# Patient Record
Sex: Male | Born: 1950 | Race: White | Hispanic: No | Smoking: Never smoker
Health system: Southern US, Community
[De-identification: ages and names within clinical notes are randomized; demographics above are authoritative.]

## PROBLEM LIST (undated history)

## (undated) DIAGNOSIS — K750 Abscess of liver: Secondary | ICD-10-CM

## (undated) DIAGNOSIS — N189 Chronic kidney disease, unspecified: Secondary | ICD-10-CM

## (undated) DIAGNOSIS — I499 Cardiac arrhythmia, unspecified: Secondary | ICD-10-CM

## (undated) DIAGNOSIS — I10 Essential (primary) hypertension: Secondary | ICD-10-CM

## (undated) DIAGNOSIS — R931 Abnormal findings on diagnostic imaging of heart and coronary circulation: Secondary | ICD-10-CM

## (undated) DIAGNOSIS — I219 Acute myocardial infarction, unspecified: Secondary | ICD-10-CM

## (undated) DIAGNOSIS — I509 Heart failure, unspecified: Secondary | ICD-10-CM

## (undated) DIAGNOSIS — I428 Other cardiomyopathies: Secondary | ICD-10-CM

## (undated) DIAGNOSIS — K529 Noninfective gastroenteritis and colitis, unspecified: Secondary | ICD-10-CM

## (undated) HISTORY — PX: KNEE ARTHROSCOPY: SUR90

## (undated) HISTORY — PX: SHOULDER ARTHROSCOPY: SHX128

## (undated) HISTORY — PX: INSERT / REPLACE / REMOVE PACEMAKER: SUR710

## (undated) HISTORY — PX: OTHER SURGICAL HISTORY: SHX169

## (undated) HISTORY — PX: FRACTURE SURGERY: SHX138

## (undated) HISTORY — PX: COLONOSCOPY: SHX174

## (undated) HISTORY — PX: HERNIA REPAIR: SHX51

---

## 2006-01-02 ENCOUNTER — Emergency Department: Payer: Self-pay | Admitting: Emergency Medicine

## 2010-11-14 ENCOUNTER — Ambulatory Visit: Payer: Self-pay | Admitting: General Surgery

## 2010-11-16 LAB — PATHOLOGY REPORT

## 2011-01-23 ENCOUNTER — Ambulatory Visit: Payer: Self-pay | Admitting: General Surgery

## 2011-01-25 ENCOUNTER — Ambulatory Visit: Payer: Self-pay | Admitting: General Surgery

## 2013-05-12 ENCOUNTER — Inpatient Hospital Stay: Payer: Self-pay | Admitting: Student

## 2013-05-12 LAB — TROPONIN I
TROPONIN-I: 0.07 ng/mL — AB
Troponin-I: 0.06 ng/mL — ABNORMAL HIGH
Troponin-I: 0.05 ng/mL

## 2013-05-12 LAB — URINALYSIS, COMPLETE
Bacteria: NONE SEEN
Glucose,UR: NEGATIVE mg/dL (ref 0–75)
Hyaline Cast: 10
Ketone: NEGATIVE
Leukocyte Esterase: NEGATIVE
NITRITE: NEGATIVE
Ph: 5 (ref 4.5–8.0)
Protein: NEGATIVE
RBC,UR: 3 /HPF (ref 0–5)
Specific Gravity: 1.014 (ref 1.003–1.030)
Squamous Epithelial: NONE SEEN
WBC UR: 2 /HPF (ref 0–5)

## 2013-05-12 LAB — CBC WITH DIFFERENTIAL/PLATELET
BASOS ABS: 0 10*3/uL (ref 0.0–0.1)
BASOS PCT: 0.2 %
Eosinophil #: 0 10*3/uL (ref 0.0–0.7)
Eosinophil %: 0.1 %
HCT: 28.1 % — ABNORMAL LOW (ref 40.0–52.0)
HGB: 9.3 g/dL — ABNORMAL LOW (ref 13.0–18.0)
LYMPHS PCT: 3.6 %
Lymphocyte #: 0.4 10*3/uL — ABNORMAL LOW (ref 1.0–3.6)
MCH: 29.8 pg (ref 26.0–34.0)
MCHC: 33.1 g/dL (ref 32.0–36.0)
MCV: 90 fL (ref 80–100)
MONOS PCT: 5.3 %
Monocyte #: 0.6 x10 3/mm (ref 0.2–1.0)
NEUTROS ABS: 10 10*3/uL — AB (ref 1.4–6.5)
Neutrophil %: 90.8 %
Platelet: 108 10*3/uL — ABNORMAL LOW (ref 150–440)
RBC: 3.12 10*6/uL — ABNORMAL LOW (ref 4.40–5.90)
RDW: 15 % — ABNORMAL HIGH (ref 11.5–14.5)
WBC: 11 10*3/uL — AB (ref 3.8–10.6)

## 2013-05-12 LAB — COMPREHENSIVE METABOLIC PANEL
ALBUMIN: 1.9 g/dL — AB (ref 3.4–5.0)
AST: 50 U/L — AB (ref 15–37)
Alkaline Phosphatase: 173 U/L — ABNORMAL HIGH
Anion Gap: 9 (ref 7–16)
BUN: 67 mg/dL — AB (ref 7–18)
Bilirubin,Total: 1.1 mg/dL — ABNORMAL HIGH (ref 0.2–1.0)
CALCIUM: 8.6 mg/dL (ref 8.5–10.1)
Chloride: 99 mmol/L (ref 98–107)
Co2: 20 mmol/L — ABNORMAL LOW (ref 21–32)
Creatinine: 3.45 mg/dL — ABNORMAL HIGH (ref 0.60–1.30)
EGFR (African American): 21 — ABNORMAL LOW
EGFR (Non-African Amer.): 18 — ABNORMAL LOW
GLUCOSE: 124 mg/dL — AB (ref 65–99)
OSMOLALITY: 278 (ref 275–301)
POTASSIUM: 4.2 mmol/L (ref 3.5–5.1)
SGPT (ALT): 60 U/L (ref 12–78)
Sodium: 128 mmol/L — ABNORMAL LOW (ref 136–145)
TOTAL PROTEIN: 6.4 g/dL (ref 6.4–8.2)

## 2013-05-12 LAB — CLOSTRIDIUM DIFFICILE(ARMC)

## 2013-05-12 LAB — CBC
HCT: 30.4 % — AB (ref 40.0–52.0)
HGB: 10.5 g/dL — ABNORMAL LOW (ref 13.0–18.0)
MCH: 31.1 pg (ref 26.0–34.0)
MCHC: 34.6 g/dL (ref 32.0–36.0)
MCV: 90 fL (ref 80–100)
PLATELETS: 113 10*3/uL — AB (ref 150–440)
RBC: 3.37 10*6/uL — AB (ref 4.40–5.90)
RDW: 14.9 % — ABNORMAL HIGH (ref 11.5–14.5)
WBC: 10.4 10*3/uL (ref 3.8–10.6)

## 2013-05-12 LAB — CK-MB
CK-MB: 1.3 ng/mL (ref 0.5–3.6)
CK-MB: 1.8 ng/mL (ref 0.5–3.6)
CK-MB: 2.3 ng/mL (ref 0.5–3.6)

## 2013-05-12 LAB — LIPASE, BLOOD: Lipase: 415 U/L — ABNORMAL HIGH (ref 73–393)

## 2013-05-12 LAB — DIGOXIN LEVEL: DIGOXIN: 0.69 ng/mL

## 2013-05-12 LAB — CK: CK, TOTAL: 26 U/L — AB

## 2013-05-12 LAB — AMMONIA: Ammonia, Plasma: <10 mcmol/L (ref 11–32)

## 2013-05-12 LAB — APTT: Activated PTT: 29.3 secs (ref 23.6–35.9)

## 2013-05-13 DIAGNOSIS — I5022 Chronic systolic (congestive) heart failure: Secondary | ICD-10-CM

## 2013-05-13 DIAGNOSIS — I4891 Unspecified atrial fibrillation: Secondary | ICD-10-CM

## 2013-05-13 DIAGNOSIS — I319 Disease of pericardium, unspecified: Secondary | ICD-10-CM

## 2013-05-13 LAB — CBC WITH DIFFERENTIAL/PLATELET
BASOS ABS: 0 10*3/uL (ref 0.0–0.1)
BASOS PCT: 0.1 %
EOS ABS: 0 10*3/uL (ref 0.0–0.7)
Eosinophil %: 0.1 %
HCT: 27 % — ABNORMAL LOW (ref 40.0–52.0)
HGB: 9.1 g/dL — AB (ref 13.0–18.0)
Lymphocyte #: 0.5 10*3/uL — ABNORMAL LOW (ref 1.0–3.6)
Lymphocyte %: 3.9 %
MCH: 30.6 pg (ref 26.0–34.0)
MCHC: 33.9 g/dL (ref 32.0–36.0)
MCV: 90 fL (ref 80–100)
MONO ABS: 1.2 x10 3/mm — AB (ref 0.2–1.0)
Monocyte %: 8.9 %
NEUTROS PCT: 87 %
Neutrophil #: 11.5 10*3/uL — ABNORMAL HIGH (ref 1.4–6.5)
PLATELETS: 110 10*3/uL — AB (ref 150–440)
RBC: 2.99 10*6/uL — ABNORMAL LOW (ref 4.40–5.90)
RDW: 15.1 % — AB (ref 11.5–14.5)
WBC: 13.2 10*3/uL — AB (ref 3.8–10.6)

## 2013-05-13 LAB — BASIC METABOLIC PANEL
ANION GAP: 9 (ref 7–16)
BUN: 60 mg/dL — ABNORMAL HIGH (ref 7–18)
CALCIUM: 7.8 mg/dL — AB (ref 8.5–10.1)
CHLORIDE: 105 mmol/L (ref 98–107)
CO2: 18 mmol/L — AB (ref 21–32)
CREATININE: 2.46 mg/dL — AB (ref 0.60–1.30)
GFR CALC AF AMER: 32 — AB
GFR CALC NON AF AMER: 27 — AB
Glucose: 135 mg/dL — ABNORMAL HIGH (ref 65–99)
Osmolality: 283 (ref 275–301)
POTASSIUM: 4.1 mmol/L (ref 3.5–5.1)
Sodium: 132 mmol/L — ABNORMAL LOW (ref 136–145)

## 2013-05-13 LAB — RAPID HIV-1/2 QL/CONFIRM: HIV-1/2, RAPID QL: NEGATIVE

## 2013-05-13 LAB — AFP TUMOR MARKER

## 2013-05-13 LAB — CANCER ANTIGEN 19-9: CA 19-9: 11 U/mL (ref 0–35)

## 2013-05-13 LAB — PROTEIN / CREATININE RATIO, URINE
Creatinine, Urine: 15.7 mg/dL — ABNORMAL LOW (ref 30.0–125.0)
PROTEIN, RANDOM URINE: 13 mg/dL — AB (ref 0–12)
Protein/Creat. Ratio: 828 mg/gCREAT — ABNORMAL HIGH (ref 0–200)

## 2013-05-13 LAB — CEA: CEA: 1.5 ng/mL (ref 0.0–4.7)

## 2013-05-13 LAB — HEMOGLOBIN A1C: Hemoglobin A1C: 7.7 % — ABNORMAL HIGH (ref 4.2–6.3)

## 2013-05-13 LAB — TSH: Thyroid Stimulating Horm: 0.49 u[IU]/mL

## 2013-05-13 LAB — MAGNESIUM: Magnesium: 2 mg/dL

## 2013-05-13 LAB — APTT
Activated PTT: 56.4 secs — ABNORMAL HIGH (ref 23.6–35.9)
Activated PTT: 62.6 secs — ABNORMAL HIGH (ref 23.6–35.9)

## 2013-05-14 LAB — BASIC METABOLIC PANEL
Anion Gap: 5 — ABNORMAL LOW (ref 7–16)
BUN: 25 mg/dL — ABNORMAL HIGH (ref 7–18)
CO2: 24 mmol/L (ref 21–32)
Calcium, Total: 7.7 mg/dL — ABNORMAL LOW (ref 8.5–10.1)
Chloride: 105 mmol/L (ref 98–107)
Creatinine: 1.33 mg/dL — ABNORMAL HIGH (ref 0.60–1.30)
EGFR (African American): >60
GFR CALC NON AF AMER: 58 — AB
Glucose: 112 mg/dL — ABNORMAL HIGH (ref 65–99)
Osmolality: 273 (ref 275–301)
POTASSIUM: 3.5 mmol/L (ref 3.5–5.1)
Sodium: 134 mmol/L — ABNORMAL LOW (ref 136–145)

## 2013-05-14 LAB — CBC WITH DIFFERENTIAL/PLATELET
Basophil #: 0 10*3/uL (ref 0.0–0.1)
Basophil %: 0.2 %
Eosinophil #: 0 10*3/uL (ref 0.0–0.7)
Eosinophil %: 0.7 %
HCT: 27 % — AB (ref 40.0–52.0)
HGB: 9.4 g/dL — ABNORMAL LOW (ref 13.0–18.0)
LYMPHS ABS: 0.9 10*3/uL — AB (ref 1.0–3.6)
LYMPHS PCT: 12 %
MCH: 31.1 pg (ref 26.0–34.0)
MCHC: 35 g/dL (ref 32.0–36.0)
MCV: 89 fL (ref 80–100)
MONO ABS: 0.6 x10 3/mm (ref 0.2–1.0)
MONOS PCT: 8.9 %
NEUTROS PCT: 78.2 %
Neutrophil #: 5.6 10*3/uL (ref 1.4–6.5)
PLATELETS: 106 10*3/uL — AB (ref 150–440)
RBC: 3.03 10*6/uL — AB (ref 4.40–5.90)
RDW: 15.1 % — AB (ref 11.5–14.5)
WBC: 7.2 10*3/uL (ref 3.8–10.6)

## 2013-05-14 LAB — STOOL CULTURE

## 2013-05-14 LAB — URINE CULTURE

## 2013-05-14 LAB — APTT: Activated PTT: 81.3 secs — ABNORMAL HIGH (ref 23.6–35.9)

## 2013-05-15 DIAGNOSIS — I319 Disease of pericardium, unspecified: Secondary | ICD-10-CM

## 2013-05-15 DIAGNOSIS — I4891 Unspecified atrial fibrillation: Secondary | ICD-10-CM

## 2013-05-15 DIAGNOSIS — I5033 Acute on chronic diastolic (congestive) heart failure: Secondary | ICD-10-CM

## 2013-05-15 LAB — PROTEIN ELECTROPHORESIS(ARMC)

## 2013-05-15 LAB — BASIC METABOLIC PANEL
Anion Gap: 6 — ABNORMAL LOW (ref 7–16)
BUN: 11 mg/dL (ref 7–18)
Calcium, Total: 7.3 mg/dL — ABNORMAL LOW (ref 8.5–10.1)
Chloride: 104 mmol/L (ref 98–107)
Co2: 26 mmol/L (ref 21–32)
Creatinine: 0.96 mg/dL (ref 0.60–1.30)
Glucose: 124 mg/dL — ABNORMAL HIGH (ref 65–99)
Osmolality: 273 (ref 275–301)
POTASSIUM: 3.3 mmol/L — AB (ref 3.5–5.1)
SODIUM: 136 mmol/L (ref 136–145)

## 2013-05-15 LAB — APTT: ACTIVATED PTT: 66.4 s — AB (ref 23.6–35.9)

## 2013-05-15 LAB — CK-MB
CK-MB: 1.5 ng/mL (ref 0.5–3.6)
CK-MB: 1.4 ng/mL (ref 0.5–3.6)

## 2013-05-15 LAB — TROPONIN I
Troponin-I: 0.03 ng/mL
Troponin-I: 0.05 ng/mL

## 2013-05-15 LAB — HEMOGLOBIN
HGB: 9.4 g/dL — AB (ref 13.0–18.0)
HGB: 10.2 g/dL — AB (ref 13.0–18.0)
HGB: 9.7 g/dL — ABNORMAL LOW (ref 13.0–18.0)

## 2013-05-15 LAB — UR PROT ELECTROPHORESIS, URINE RANDOM

## 2013-05-16 LAB — CBC WITH DIFFERENTIAL/PLATELET
BASOS ABS: 0 10*3/uL (ref 0.0–0.1)
BASOS PCT: 0.8 %
EOS PCT: 1.4 %
Eosinophil #: 0.1 10*3/uL (ref 0.0–0.7)
HCT: 29 % — ABNORMAL LOW (ref 40.0–52.0)
HGB: 9.6 g/dL — ABNORMAL LOW (ref 13.0–18.0)
LYMPHS ABS: 0.9 10*3/uL — AB (ref 1.0–3.6)
LYMPHS PCT: 16.8 %
MCH: 29.7 pg (ref 26.0–34.0)
MCHC: 33.1 g/dL (ref 32.0–36.0)
MCV: 90 fL (ref 80–100)
Monocyte #: 0.8 x10 3/mm (ref 0.2–1.0)
Monocyte %: 13.6 %
NEUTROS PCT: 67.4 %
Neutrophil #: 3.8 10*3/uL (ref 1.4–6.5)
Platelet: 107 10*3/uL — ABNORMAL LOW (ref 150–440)
RBC: 3.23 10*6/uL — ABNORMAL LOW (ref 4.40–5.90)
RDW: 14.8 % — ABNORMAL HIGH (ref 11.5–14.5)
WBC: 5.6 10*3/uL (ref 3.8–10.6)

## 2013-05-16 LAB — MAGNESIUM
Magnesium: 1.4 mg/dL — ABNORMAL LOW
Magnesium: 1.8 mg/dL

## 2013-05-16 LAB — BASIC METABOLIC PANEL
ANION GAP: 6 — AB (ref 7–16)
BUN: 8 mg/dL (ref 7–18)
Calcium, Total: 7.5 mg/dL — ABNORMAL LOW (ref 8.5–10.1)
Chloride: 105 mmol/L (ref 98–107)
Co2: 26 mmol/L (ref 21–32)
Creatinine: 0.97 mg/dL (ref 0.60–1.30)
EGFR (African American): >60
Glucose: 114 mg/dL — ABNORMAL HIGH (ref 65–99)
Osmolality: 273 (ref 275–301)
Potassium: 3.5 mmol/L (ref 3.5–5.1)
Sodium: 137 mmol/L (ref 136–145)

## 2013-05-16 LAB — VANCOMYCIN, TROUGH: VANCOMYCIN, TROUGH: 5 ug/mL — AB (ref 10–20)

## 2013-05-17 LAB — CBC WITH DIFFERENTIAL/PLATELET
Basophil #: 0 10*3/uL (ref 0.0–0.1)
Basophil %: 0.7 %
EOS PCT: 2.2 %
Eosinophil #: 0.1 10*3/uL (ref 0.0–0.7)
HCT: 25.7 % — ABNORMAL LOW (ref 40.0–52.0)
HGB: 8.7 g/dL — ABNORMAL LOW (ref 13.0–18.0)
LYMPHS ABS: 0.8 10*3/uL — AB (ref 1.0–3.6)
Lymphocyte %: 15.5 %
MCH: 30.2 pg (ref 26.0–34.0)
MCHC: 33.9 g/dL (ref 32.0–36.0)
MCV: 89 fL (ref 80–100)
MONO ABS: 0.6 x10 3/mm (ref 0.2–1.0)
MONOS PCT: 11.2 %
NEUTROS PCT: 70.4 %
Neutrophil #: 3.7 10*3/uL (ref 1.4–6.5)
PLATELETS: 118 10*3/uL — AB (ref 150–440)
RBC: 2.88 10*6/uL — AB (ref 4.40–5.90)
RDW: 14.6 % — AB (ref 11.5–14.5)
WBC: 5.2 10*3/uL (ref 3.8–10.6)

## 2013-05-17 LAB — COMPREHENSIVE METABOLIC PANEL
ALBUMIN: 1.6 g/dL — AB (ref 3.4–5.0)
AST: 41 U/L — AB (ref 15–37)
Alkaline Phosphatase: 150 U/L — ABNORMAL HIGH
Anion Gap: 4 — ABNORMAL LOW (ref 7–16)
BILIRUBIN TOTAL: 0.7 mg/dL (ref 0.2–1.0)
BUN: 6 mg/dL — ABNORMAL LOW (ref 7–18)
Calcium, Total: 7.4 mg/dL — ABNORMAL LOW (ref 8.5–10.1)
Chloride: 105 mmol/L (ref 98–107)
Co2: 27 mmol/L (ref 21–32)
Creatinine: 0.83 mg/dL (ref 0.60–1.30)
EGFR (African American): >60
GLUCOSE: 107 mg/dL — AB (ref 65–99)
OSMOLALITY: 270 (ref 275–301)
POTASSIUM: 3.4 mmol/L — AB (ref 3.5–5.1)
SGPT (ALT): 34 U/L (ref 12–78)
Sodium: 136 mmol/L (ref 136–145)
Total Protein: 5.2 g/dL — ABNORMAL LOW (ref 6.4–8.2)

## 2013-05-17 LAB — VANCOMYCIN, TROUGH: Vancomycin, Trough: 12 ug/mL (ref 10–20)

## 2013-05-18 ENCOUNTER — Ambulatory Visit: Payer: Self-pay | Admitting: Oncology

## 2013-05-18 LAB — CULTURE, BLOOD (SINGLE)

## 2013-05-18 LAB — CREATININE, SERUM
CREATININE: 0.83 mg/dL (ref 0.60–1.30)
EGFR (Non-African Amer.): >60

## 2013-05-18 LAB — APTT: Activated PTT: 30.3 secs (ref 23.6–35.9)

## 2013-05-19 LAB — COMPREHENSIVE METABOLIC PANEL
ALBUMIN: 1.7 g/dL — AB (ref 3.4–5.0)
ALT: 48 U/L (ref 12–78)
Alkaline Phosphatase: 171 U/L — ABNORMAL HIGH
Anion Gap: 4 — ABNORMAL LOW (ref 7–16)
BUN: 12 mg/dL (ref 7–18)
Bilirubin,Total: 0.5 mg/dL (ref 0.2–1.0)
CALCIUM: 7.8 mg/dL — AB (ref 8.5–10.1)
CHLORIDE: 108 mmol/L — AB (ref 98–107)
CREATININE: 0.81 mg/dL (ref 0.60–1.30)
Co2: 28 mmol/L (ref 21–32)
EGFR (African American): >60
EGFR (Non-African Amer.): >60
GLUCOSE: 99 mg/dL (ref 65–99)
OSMOLALITY: 279 (ref 275–301)
Potassium: 4.5 mmol/L (ref 3.5–5.1)
SGOT(AST): 44 U/L — ABNORMAL HIGH (ref 15–37)
Sodium: 140 mmol/L (ref 136–145)
Total Protein: 5.4 g/dL — ABNORMAL LOW (ref 6.4–8.2)

## 2013-05-19 LAB — CBC WITH DIFFERENTIAL/PLATELET
BASOS ABS: 0 10*3/uL (ref 0.0–0.1)
BASOS PCT: 0.7 %
EOS ABS: 0.2 10*3/uL (ref 0.0–0.7)
Eosinophil %: 3 %
HCT: 24.8 % — ABNORMAL LOW (ref 40.0–52.0)
HGB: 8.6 g/dL — AB (ref 13.0–18.0)
Lymphocyte #: 1 10*3/uL (ref 1.0–3.6)
Lymphocyte %: 18.5 %
MCH: 31.1 pg (ref 26.0–34.0)
MCHC: 34.5 g/dL (ref 32.0–36.0)
MCV: 90 fL (ref 80–100)
MONOS PCT: 10.9 %
Monocyte #: 0.6 x10 3/mm (ref 0.2–1.0)
NEUTROS ABS: 3.6 10*3/uL (ref 1.4–6.5)
NEUTROS PCT: 66.9 %
Platelet: 146 10*3/uL — ABNORMAL LOW (ref 150–440)
RBC: 2.75 10*6/uL — ABNORMAL LOW (ref 4.40–5.90)
RDW: 15.2 % — ABNORMAL HIGH (ref 11.5–14.5)
WBC: 5.3 10*3/uL (ref 3.8–10.6)

## 2013-05-19 LAB — APTT
ACTIVATED PTT: 34.7 s (ref 23.6–35.9)
ACTIVATED PTT: 38.8 s — AB (ref 23.6–35.9)
Activated PTT: 36.6 secs — ABNORMAL HIGH (ref 23.6–35.9)

## 2013-05-19 LAB — CULTURE, BLOOD (SINGLE)

## 2013-05-20 ENCOUNTER — Telehealth: Payer: Self-pay | Admitting: *Deleted

## 2013-05-20 DIAGNOSIS — I319 Disease of pericardium, unspecified: Secondary | ICD-10-CM

## 2013-05-20 DIAGNOSIS — I4891 Unspecified atrial fibrillation: Secondary | ICD-10-CM

## 2013-05-20 DIAGNOSIS — I5033 Acute on chronic diastolic (congestive) heart failure: Secondary | ICD-10-CM

## 2013-05-20 LAB — CULTURE, BLOOD (SINGLE)

## 2013-05-20 NOTE — Telephone Encounter (Signed)
Patient contacted regarding discharge from  on .  Patient understands to follow up with provider  on  at  at . Patient understands discharge instructions?  Patient understands medications and regiment?  Patient understands to bring all medications to this visit?   Attempted to call patient for TCM. No answer. 2/25.

## 2013-05-24 ENCOUNTER — Ambulatory Visit: Payer: Self-pay | Admitting: Oncology

## 2013-05-27 ENCOUNTER — Encounter: Payer: BC Managed Care – PPO | Admitting: Cardiovascular Disease

## 2013-08-06 ENCOUNTER — Ambulatory Visit: Payer: Self-pay

## 2013-08-27 ENCOUNTER — Ambulatory Visit: Payer: Self-pay | Admitting: Gastroenterology

## 2013-08-28 LAB — PATHOLOGY REPORT

## 2014-07-17 NOTE — Consult Note (Signed)
Chief Complaint:  Subjective/Chief Complaint Events of the last 48 hrs noted. No abd pain. Diarrhea better. Wants solid food.   VITAL SIGNS/ANCILLARY NOTES: **Vital Signs.:   23-Feb-15 06:54  Vital Signs Type Routine  Temperature Temperature (F) 97.3  Celsius 36.2  Pulse Pulse 56  Respirations Respirations 22  Systolic BP Systolic BP 030  Diastolic BP (mmHg) Diastolic BP (mmHg) 74  Mean BP 88  Pulse Ox % Pulse Ox % 98  Pulse Ox Activity Level  At rest  Oxygen Delivery 2L   Brief Assessment:  GEN no acute distress   Cardiac Regular   Respiratory clear BS   Gastrointestinal Normal   Gastrointestinal details normal Nontender   Lab Results: Routine Chem:  23-Feb-15 04:38   Creatinine (comp) 0.83  eGFR (African American) >60  eGFR (Non-African American) >60 (eGFR values <63m/min/1.73 m2 may be an indication of chronic kidney disease (CKD). Calculated eGFR is useful in patients with stable renal function. The eGFR calculation will not be reliable in acutely ill patients when serum creatinine is changing rapidly. It is not useful in  patients on dialysis. The eGFR calculation may not be applicable to patients at the low and high extremes of body sizes, pregnant women, and vegetarians.)  Result Comment labs - This specimen was collected through an   - indwelling catheter or arterial line.  - A minimum of 53m of blood was wasted prior    - to collecting the sample.  Interpret  - results with caution.  Result(s) reported on 18 May 2013 at 04:55AM.   Radiology Results: CT:    20-Feb-15 17:11, CT Abdomen and Pelvis With Contrast  CT Abdomen and Pelvis With Contrast   REASON FOR EXAM:    (1) colitis and increased pain; (2) same  COMMENTS:       PROCEDURE: CT  - CT ABDOMEN / PELVIS  W  - May 15 2013  5:11PM     CLINICAL DATA:  Bloody diarrhea    EXAM:  CT ABDOMEN AND PELVIS WITH CONTRAST    TECHNIQUE:  Multidetector CT imaging of the abdomen and pelvis was  performed  using the standard protocol following bolus administration of  intravenous contrast.  CONTRAST:  100 cc of Isovue 370    COMPARISON:  05/12/2013    FINDINGS:  There is no pleural effusion identified. Small pericardial effusion  is identified.    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 There is nonocclusive clot identified  within the proximal portion of the left hepatic lobe portal vein  branch. Multifocal, ill-defined areas of peripheral predominant  low-attenuation are again identified. These appears progressive with  new areas in the posterior right hepatic lobe and increasing areas  within the anterior lateral right hepatic lobe. Within the anterior  right hepatic lobe there is a low attenuation area measuring 5 x 3.2  cm. Within this area of there is a more central area of fluid  attenuation measuring 1 cm. The gallbladder appears normal. There is  no biliary dilatation. Normalappearance of the pancreas. The spleen  measures 11 cm in cranial caudal dimension.    The adrenal glands are both normal. Normal appearance of the right  kidney. Left renal cysts are again noted. The urinary bladder is  collapsed around a Foley catheter balloon. There is prostate gland  enlargement. The seminal vesicles appear normal.    Calcified atherosclerotic disease affects the abdominal aorta. There  is no  aneurysm. Portahepatic lymph node measures 1.1 cm and is  similar to previous exam. Multiple small retroperitoneal lymph nodes  are identified. No pelvic or inguinal adenopathy identified.  The stomach is normal. The small bowel loops have a normal course  and caliber without obstruction. Normal appearance of the proximal  colon. Multiple distal colonic diverticula identified. Similar  appearance of abnormal wall thickening and mild pericolonic  inflammation involving the sigmoid colon. 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    A small amount of free fluid is identified within the pelvis which  appears new from previous exam. No abscess identified.    Review of the visualized osseous structures is significant for  lumbar spondylosis. Schmorl snow deformity is identified at the L4  level. Bilateral L5 pars defects are identified.   IMPRESSION:  1. 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  There is no evidence for progression into the extrahepatic portal  vein. The splenic vein a remains patent.  2. Progressive areas of low attenuation within the right hepatic  lobe may represent areas of infarct or abscess. Less likely these  could represent foci of metastatic disease.  3. New small amount of free fluid identified within the dependent  portion of the pelvis.  4. Similar appearance of abnormal wall thickening involving the  sigmoid colon which may represent segmental colitis or acute  diverticulitis. 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  5. Pericardial effusion.  These results will be called to the ordering clinician or  representative by the Radiologist Assistant, and communication  documented in the PACS Dashboard.      Electronically Signed    By: Kerby Moors M.D.    On: 05/15/2013 17:27         Verified By: Angelita Ingles, M.D.,   Assessment/Plan:  Assessment/Plan:  Assessment Colitis. Liver abscess? Portal vein thrombosis.   Plan Let's try solids and see if diarrhea or bleeding recurs. If not, then consider ordering heparin again to treat portal v thrombosis. Consider hematology consult to manage long term treatment of portal v thrombosis. If diarrhea worsens, will increase mesalamine dose. Continue Abx. Will follow. THanks.   Electronic Signatures: Verdie Shire (MD)  (Signed 23-Feb-15 11:16)  Authored: Chief Complaint, VITAL SIGNS/ANCILLARY NOTES, Brief Assessment, Lab Results,  Radiology Results, Assessment/Plan   Last Updated: 23-Feb-15 11:16 by Verdie Shire (MD)

## 2014-07-17 NOTE — Consult Note (Signed)
PATIENT NAME:  Derek Barnes, Derek Barnes MR#:  [id number] DATE OF BIRTH:  1950/09/07  DATE OF CONSULTATION:  05/13/2013  REFERRING PHYSICIAN:  Dr. Tressia Miners.  CONSULTING PHYSICIAN:  Cheral Marker. Ola Spurr, MD  REASON FOR CONSULTATION: Liver abscesses and diarrhea.   HISTORY OF PRESENT ILLNESS: This is a very pleasant 64 year old gentleman with a history of severe CHF who was in his usual state of health until approximately 2 weeks ago when he developed a flu-like illness. He then developed diarrhea, nausea and vomiting. He was also having some abdominal pain and fevers. The vomiting resolved, but he continued to have multiple loose stools a day. He continued to take his medications for CHF and then became very dizzy and was found to be profoundly hypotensive when he came to the Emergency Room. He also was noted to be in acute renal failure. He was admitted on February 17th for further workup. CT scan of his abdomen showed an area of focal colitis as well as portal vein thrombosis and likely multiple liver abscesses. Since admission, his renal function has improved some, and his blood pressure has stabilized with IV fluids and low-dose pressors. He has been continued on vancomycin and meropenem since admission.   Currently, the patient reports feeling somewhat better, although he is very concerned because he thinks he may have cancer. His breathing has stabilized, and he is no longer dizzy.   PAST MEDICAL HISTORY:  1. CHF, advanced, with an EF in the 10 to 15 range.  2. Afib.   3. Permanent pacemaker and defibrillator.  4. Hypertension.  5. Bilateral knee surgery, ankle surgery, shoulder surgery and hernia repair.  6. Prior colonoscopy showing colitis.   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 He does not smoke, drink or use drugs. He is a Hotel manager. He has no other animal exposures.   FAMILY HISTORY: Mother with  coronary artery disease.   ALLERGIES: PENICILLIN.   ANTIBIOTICS: The patient has received a dose of Cipro and Flagyl and is currently on vancomycin and meropenem.   OUTPATIENT MEDICATIONS: Includes Lipitor, carvedilol, digoxin, Lasix, isosorbide, metoprolol, ramipril, sotalol, spironolactone.   REVIEW OF SYSTEMS: Eleven systems are negative except as per HPI. He has lost a significant amount of weight he reports since this began.   PHYSICAL EXAMINATION:  VITALS: T-max on admission was 96.3 and blood pressure was 56/38. T-max since admission is 101.8. Pulse is 100. Blood pressure is 112/56. Sat is 99% on room air.  GENERAL: He is well developed, well nourished, in no acute distress.  HEENT: Pupils are equal, round and reactive to light and accommodation. Extraocular movements are intact. Sclerae are anicteric. His oropharynx is clear with no thrush.  NECK: Supple.  HEART: Regular with distant heart sounds.  LUNGS: Clear.  ABDOMEN: Soft, mildly distended, mildly tender to palpation over the right upper quadrant.  EXTREMITIES: No clubbing, cyanosis or edema.  SKIN: He has a mildly jaundiced appearance to the skin. He has no peripheral stigmata of endocarditis.  NEUROLOGIC: He is alert and oriented x 3. Cranial nerves II through XII are grossly intact.   DATA: Labs are reviewed. Blood cultures from admission are growing 1 of 2 with gram-positive cocci in 1 bottle. C. diff is negative. Urine culture is negative. Stool culture is holding for possible pathogen. White blood count on admission was 10.4, currently is 13.2. Hemoglobin is 9.2, platelets of 110. Troponin slightly  elevated at 0.07. TSH normal. LFTs show an albumin of 1.9, T-bili 1.1, alk phos 173, AST 50. Creatinine on admission was 3.4 with a BUN of 67. Today, it is 60/2.46. Rapid HIV test done today was negative. Echocardiogram done today shows EF of 10% to 15%. Some moderate pericardial effusion. There is mild thickening of the anterior  and posterior mitral valve leaflets. There is a severely increased left ventricule. CT scan of the abdomen done on admission reveals evidence of portal vein thrombosis which appears acute. There are also multiple low density areas in the periphery of the right liver lobe which could represent infection or tumor. There is a 9 cm segment of marked mucosal thickening in the distal sigmoid colon, similar in appearance to prior exam. This could suggest focal colitis, but tumor is a possibility. There is a moderate pericardial effusion and cardiomegaly.   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 He was markedly hypotensive and in acute renal failure, likely from volume depletion. He was noted to have protal vein thrombosis and what appeared to be liver abscesses. He has 1 of 2 blood cultures positive for gram-positive cocci.   I suspect he has multiple liver abscesses from a portal vein source, likely from the focal area of colitis or malignancy. We can see what his blood cultures will grow. It could potentially be a viridans Strep or an Enterococcus with a bowel source.   At this point, I would continue the vancomycin and meropenem pending cultures. Suggest further evaluation of the colitis per gastroenterology. Depending on the results of cultures and then further maturation of the liver lesions, he may need aspiration of them to confirm a diagnosis.   Thank you for the consult. I will be glad to follow with you.   ____________________________ Cheral Marker. Ola Spurr, MD dpf:gb D: 05/13/2013 21:58:38 ET T: 05/13/2013 22:56:45 ET JOB#: [id number]  cc: Cheral Marker. Ola Spurr, MD, <Dictator> Cashe Gatt Ola Spurr MD ELECTRONICALLY SIGNED 05/20/2013 23:25

## 2014-07-17 NOTE — Consult Note (Signed)
Pt seen and examined. Full consult to follow. Pt with 2 wk hx of diarrhea. CT shows mulitiple liver lesions, portal vein thrombosis, and sigmoid colitis. Pt now on IV heparin. Pt tells me that he was on anticoagulant at home, but I don't see evidence of this. In review of his old records, patient had a colonoscopy in 2012 that showed active colitis involving sigmoid/descending colon. Unclear if patient was treated for this. So far, tumor markers are neg. Due to pacemaker, not able to do MRI of liver to see if these are cancerous or abscesses. Agree with IV Abx. Watch for any bleeding from colitis since patient on heparin. Due to severe cardiomyopathy, patient at risk for any endoscopic procedures at this time. Will review today's echo results. Consider adding mesalamine to help with diarrhea. Will follow. Thanks.   Electronic Signatures: Lutricia Feil (MD) (Signed on 18-Feb-15 12:58)  Authored   Last Updated: 18-Feb-15 13:01 by Lutricia Feil (MD)

## 2014-07-17 NOTE — Consult Note (Signed)
Chief Complaint:  Subjective/Chief Complaint Feeling well. No abd pain. No diarrhea. No bleeding. Off heparin, as per hematology recommndations. PICC line placed.   VITAL SIGNS/ANCILLARY NOTES: **Vital Signs.:   25-Feb-15 11:56  Vital Signs Type Routine  Temperature Temperature (F) 97.4  Celsius 36.3  Temperature Source oral  Pulse Pulse 66  Systolic BP Systolic BP 756  Diastolic BP (mmHg) Diastolic BP (mmHg) 75  Mean BP 92  Pulse Ox % Pulse Ox % 100  Pulse Ox Activity Level  At rest  Oxygen Delivery Room Air/ 21 %   Brief Assessment:  GEN no acute distress   Cardiac Regular   Respiratory clear BS   Gastrointestinal Normal   Lab Results: Hepatic:  24-Feb-15 06:25   Bilirubin, Total 0.5  Alkaline Phosphatase  171 (45-117 NOTE: New Reference Range 02/13/13)  SGPT (ALT) 48  SGOT (AST)  44  Total Protein, Serum  5.4  Albumin, Serum  1.7  Routine Chem:  24-Feb-15 06:25   Glucose, Serum 99  BUN 12  Creatinine (comp) 0.81  Sodium, Serum 140  Potassium, Serum 4.5  Chloride, Serum  108  CO2, Serum 28  Calcium (Total), Serum  7.8  Osmolality (calc) 279  eGFR (African American) >60  eGFR (Non-African American) >60 (eGFR values <47mL/min/1.73 m2 may be an indication of chronic kidney disease (CKD). Calculated eGFR is useful in patients with stable renal function. The eGFR calculation will not be reliable in acutely ill patients when serum creatinine is changing rapidly. It is not useful in  patients on dialysis. The eGFR calculation may not be applicable to patients at the low and high extremes of body sizes, pregnant women, and vegetarians.)  Result Comment LABS - This specimen was collected through an   - indwelling catheter or arterial line.  - A minimum of 97mls of blood was wasted prior    - to collecting the sample.  Interpret  - results with caution.  Result(s) reported on 19 May 2013 at 07:08AM.  Anion Gap  4  Routine Hem:  24-Feb-15 06:25   WBC (CBC)  5.3  RBC (CBC)  2.75  Hemoglobin (CBC)  8.6  Hematocrit (CBC)  24.8  Platelet Count (CBC)  146  MCV 90  MCH 31.1  MCHC 34.5  RDW  15.2  Neutrophil % 66.9  Lymphocyte % 18.5  Monocyte % 10.9  Eosinophil % 3.0  Basophil % 0.7  Neutrophil # 3.6  Lymphocyte # 1.0  Monocyte # 0.6  Eosinophil # 0.2  Basophil # 0.0 (Result(s) reported on 19 May 2013 at 07:12AM.)   Assessment/Plan:  Assessment/Plan:  Assessment Colitis. Stable. Liver abscess, on Abx.   Plan Make sure to continue mesalamin bid on discharge. IV Abx on discharge. Will sign off. Can f/u with [place] in few weeks. Thanks.   Electronic Signatures: Verdie Shire (MD)  (Signed 25-Feb-15 12:18)  Authored: Chief Complaint, VITAL SIGNS/ANCILLARY NOTES, Brief Assessment, Lab Results, Assessment/Plan   Last Updated: 25-Feb-15 12:18 by Verdie Shire (MD)

## 2014-07-17 NOTE — Consult Note (Signed)
Chief Complaint:  Subjective/Chief Complaint The patient denies any abd pain. Also reports that his diarrhea has improved. No new complaints.   VITAL SIGNS/ANCILLARY NOTES: **Vital Signs.:   22-Feb-15 07:00  Vital Signs Type Routine  Temperature Temperature (F) 97.6  Celsius 36.4  Temperature Source oral  Pulse Pulse 66  Respirations Respirations 18  Systolic BP Systolic BP 101  Diastolic BP (mmHg) Diastolic BP (mmHg) 67  Mean BP 78  Pulse Ox % Pulse Ox % 98  Oxygen Delivery Room Air/ 21 %  Pulse Ox Heart Rate 64   Brief Assessment:  GEN well developed, well nourished, no acute distress   Respiratory normal resp effort  no use of accessory muscles   Additional Physical Exam Alert and orientated times 3   Assessment/Plan:  Assessment/Plan:  Assessment Adb pain resolved.   Plan Dr. Bluford Kaufmann to resume care tomorrow. Hb slightly down today. Continue current care from a GI point of view.   Electronic Signatures: [name] (MD)  (Signed [phone] 09:34)  Authored: Chief Complaint, VITAL SIGNS/ANCILLARY NOTES, Brief Assessment, Assessment/Plan   Last Updated: 22-Feb-15 09:34 by [name] (MD)

## 2014-07-17 NOTE — H&P (Signed)
PATIENT NAME:  Derek Barnes, Derek Barnes MR#:  [id number] DATE OF BIRTH:  [birth year]  DATE OF ADMISSION:  05/12/2013  REFERRING PHYSICIAN: Briant Sites. Joni Fears, MD  PRIMARY CARE PHYSICIAN: At [facility].   CHIEF COMPLAINT: Weakness, nausea, vomiting, diarrhea.   HISTORY OF PRESENT ILLNESS: The patient is a pleasant 64 year old Caucasian male with history of chronic systolic CHF, per him EF is very low, about 13%. History of A. fib, status post pacemaker and defibrillator, history of cardiac arrest in the past. He states that he was doing well until about 3 weeks ago or so after which he started to develop flu-like symptoms including cough, fevers which was high-grade, nausea, vomiting and diarrhea. The respiratory symptoms resolved; however, the patient has been having ongoing nausea, vomiting and diarrhea which are  multiple per day. Over the last couple of days, he has felt very weak and debilitated and dizzy. He still has been taking his medications. He has had poor p.o. intake and states that he vomits most of the food back up. He came into the hospital where he was found to be profoundly hypotensive with blood pressure of 56/38. Of note, he is also on multiple beta blockers. He states that he does follow with [facility] for his needs and those are his medications. Of note, he is on sotalol, carvedilol and metoprolol. He has received several boluses of IV fluid and he remains hypotensive. He also was noted to have significant renal failure. Hospitalist services were contacted for further evaluation and management.   PAST MEDICAL HISTORY:  1.  Hypertension.  2.  A. Fib, status post pacemaker.  3.  Cardiac arrest, status post defibrillator.  4.  Chronic systolic CHF with EF of about 13% per patient.  5.  History of bilateral knee surgeries, angle surgery, shoulder surgery, hernia repair.    SOCIAL HISTORY: No tobacco, alcohol or drug use. Lives by himself.   FAMILY HISTORY: Mom with myocardial infarction. Cancer also  runs in the family.   ALLERGIES: PENICILLIN.   OUTPATIENT MEDICATIONS: Atorvastatin 40 mg daily, carvedilol 25 mg daily, digoxin 125 mcg daily, furosemide 40 mg 2 times a day, isosorbide mononitrate 30 mg daily, metoprolol succinate 25 mg extended-release daily, Ramipril 5 mg 2 times a day, sotalol 80 mg 2 times a day, spironolactone 25 mg daily.   REVIEW OF SYSTEMS: CONSTITUTIONAL: Positive for fevers, chills, fatigue and weakness. Also thinks that he has lost weight but does not know how much.  EYES: Blurry vision recently.  ENT: No tinnitus or hearing loss.  No sore throat or postnasal drip.  RESPIRATORY: No cough or wheezing. Has chronic shortness of breath but this is not  worse. No painful respirations.  CARDIOVASCULAR: No chest pain or swelling in the legs. No orthopnea. Has a history of chronic systolic CHF, has defibrillator.  GASTROINTESTINAL: Positive for nausea, vomiting and diarrhea. It has been going on for 3 weeks. He currently feels like the nausea, vomiting and diarrhea are better and only 1 to 2 episodes per day. The diarrhea is brown, watery and loose. He denies having any recent antibiotics. His last hospitalization was in August and he had some people with flu-like symptoms about 3 weeks ago.  GENITOURINARY: Denies dysuria, hematuria.  HEMATOLOGIC AND LYMPHATIC: No anemia or easy bruising.  SKIN: No rashes.  MUSCULOSKELETAL: Has chronic arthritis.  NEUROLOGIC: No focal weakness or numbness. Has global weakness.  PSYCHIATRIC:  Denies anxiety or depression.   PHYSICAL EXAMINATION: VITAL SIGNS: Temperature on arrival was 96.3 rectally,  orally was about 94 per nurse. Initial pulse rate 62, respiratory rate 18, blood pressure 56/38, last blood pressure is 83/47, O2 sats  100% on room air.  GENERAL: Ill-appearing, pale, Caucasian male laying in bed with a bear hugger with some chills.  HEENT: Normocephalic, atraumatic. Pupils are equal and reactive. Anicteric sclerae.  Extraocular muscles intact. Very, very dry mucous membranes and cracked lips.  NECK: No JVD.  Supple. No thyroid tenderness.  CARDIOVASCULAR: S1, S2, regular. Patient has a device left lateral chest under the skin. No surrounding cellulitis or redness.  LUNGS: Clear to auscultation without wheezing, rhonchi or rales.  ABDOMEN: Soft. Mild generalized tenderness. No rebound or guarding. Hypoactive bowel sounds.  EXTREMITIES: No pitting edema.  SKIN: No obvious rashes or lesions.  NEUROLOGIC:  Cranial nerves II through XII grossly intact. Strength is 5 out of 5 in all extremities. Sensation is intact to light touch.  PSYCHIATRIC: Awake, alert and oriented x 3. Pleasant and cooperative.   LABORATORY, DIAGNOSTIC AND RADIOLOGICAL DATA:  Glucose 124, BUN 67, creatinine 3.45, sodium 128, potassium 4.2, serum CO2 is 20, lipase is 415, albumin is 1.9, total bilirubin is 1.1, alk phos 173, AST 50, ALT 60. CK total 26. White count of 10.4, hemoglobin is 10.5, platelets are 113. Lactic acid 1.3. EKG is paced. X-ray of the chest subtle blunting of the left costophrenic angle, likely minimal pleural parenchymal scarring versus tiny amount of pleural fluid, moderate stable cardiomegaly.   ASSESSMENT AND PLAN: We have a 64 year old male with history of chronic systolic  congestive heart failure with low ejection fraction, history of cardiac arrest, status post pacemaker, history of defibrillator, hypertension, who comes in with subacute nausea, vomiting, diarrhea, noted to be in shock and renal failure.  1.  Shock. I suspect the shock has got a hypovolemic component as well as possible septic. The patient has been having gastrointestinal issues for several weeks and gastrointestinal might be the culprit. Furthermore, the patient is severely volume depleted due to gastrointestinal losses and has been also taking medications. I also suspect that the patient's multiple blood pressure medications are also affecting the  hypotension. At this point, would hold all the blood pressure medications. The patient does have hypothermia as well. I would pan culture the patient including checking Clostridium difficile stool cultures, urine cultures, urinalysis and blood cultures. He has received several liters of IV fluids and at this point, would need pressors which would start. He is having a central line placed and going now. Would place a Foley for ins and outs and obtain a CAT scan of abdomen and pelvis. It is possible that this is some kind of a colitis. The likelihood of having a viral gastroenteritis is on the lower side as his symptoms have been going on for multiple weeks per him. Would start him on Zofran for his nausea. Furthermore, would start him on Cipro and Flagyl for now. I suspect cardiogenic shock is less likely as he appears to be volume depleted. No jugular venous distension.  Lungs are clear and he has no significant lower extremity edema. I would check an echocardiogram, order some troponins. EKG is not helpful as he is paced.  2.  Acute renal failure. This is likely secondary to volume depletion and dehydration due to gastrointestinal losses. Obtain a Foley, get a CAT scan to evaluate for hydronephrosis and also it will be helpful for evaluation of for possible colitis. Will obtain nephrology consult and monitor his ins and outs.  3.  Chronic systolic congestive heart failure. This is stable and he is on the depleted side. Would hold all blood pressure medications and Lasix and start the patient on IV fluid resuscitation. I would resume the digoxin and the level is slightly below therapeutic window.  4.  Hypertension as above. He is in shock and, therefore, would hold all the blood pressure medications. It is unclear why he is on 3 different beta blockers but he is being paced.  Thus, we have not seen significant bradycardia. The patient would follow with his outpatient cardiologist and hopefully on discharge we  would limit the use of beta blockade unless he really needs it.  5.  Hyponatremia. The patient has mild hyponatremia, likely secondary to volume depletion and gastrointestinal losses. Hopefully, he should improve with normal saline.  6.  Elevated LFTs. We do not have any previous labs and I do not know if this is acute or chronic, just like his BUN and creatinine. We would obtain a CAT scan of abdomen and pelvis for better visualization of his hepatobiliary tree.   Overall, the patient is critically ill and in multiorgan failure, including cardiovascular collapse and renal failure. He is at high risk of cardiopulmonary complications and arrest. He is a full code.   TOTAL TIME SPENT: 65 minutes.    ____________________________ Vivien Presto, MD sa:cs D: 05/12/2013 14:19:23 ET T: 05/12/2013 14:48:35 ET JOB#: [id number]  cc: Vivien Presto, MD, <Dictator> Vivien Presto MD ELECTRONICALLY SIGNED 05/29/2013 13:10

## 2014-07-17 NOTE — Consult Note (Signed)
PATIENT NAME:  Derek Barnes, Derek Barnes MR#:  [id number] DATE OF BIRTH:  1950-10-17  NEPHROLOGY CONSULTATION   DATE OF CONSULTATION:  05/13/2013  REFERRING PHYSICIAN:  Vivien Presto, MD CONSULTING PHYSICIAN:  Maaliyah Adolph Lilian Kapur, MD  REASON FOR CONSULTATION: Acute renal failure.   HISTORY OF PRESENT ILLNESS: The patient is a very pleasant 64 year old Caucasian male with past medical history of hypertension, atrial fibrillation, cardiac arrest, status post defibrillator placement, chronic systolic heart failure, ejection fraction 13%, history of bilateral knee surgeries, who presented to [facility] with nausea, vomiting and diarrhea. He states that these symptoms have been ongoing for approximately 3 weeks. He has had rather poor p.o. intake over this period of time. Most days he states that he was having at least 2 to 3 watery bowel movements per day. He also had periods of nausea and vomiting. He has severe underlying heart failure with ejection fraction of 13%. We do not have a known baseline creatinine at present. When he presented, creatinine was 3.45. However, with hydration, creatinine has come down to 2.46. The patient also had hyponatremia; however, this has also improved, with sodium up to 132. He is noted as having a low albumin of 1.9. He is also noted to be anemic with a hemoglobin of 9.1. Blood cultures are thus far negative, and C. difficile toxin is negative. Urinalysis was negative for protein, 3 RBCs per high-power field were noted and 2 WBCs per high-power field were noted. The patient also has been having fevers at home as well as weight loss.   PAST MEDICAL HISTORY:  1. Hypertension.  2. Atrial fibrillation.  3. Cardiac arrest with history of defibrillator placement.  4. Chronic systolic heart failure, ejection fraction 13%.  5. History of bilateral knee surgeries, ankle surgery, shoulder surgery and hernia repair.   ALLERGIES: PENICILLIN.   CURRENT INPATIENT  MEDICATIONS: Include:  1. Norepinephrine drip. 2. 0.9 normal saline at 75 mL/hour.  3. Heparin drip.  4. Lipitor 40 mg p.o. at bedtime.  5. Digoxin 0.125 mg p.o. daily.  6. Metronidazole 500 mg IV q.8 hours.  7. Zofran 4 mg IV q.6 hours p.r.n.  8. Acetaminophen 650 mg q.4 hours p.r.n.  9. Meropenem 1 gram IV x1.  10. Protonix 40 mg IV at 6:00 a.m.   SOCIAL HISTORY: The patient resides in [place]. He is unmarried. Currently on disability. Denies tobacco, alcohol or illicit drug use.   FAMILY HISTORY: Mother had history of coronary artery disease and myocardial infarction.   REVIEW OF SYSTEMS:  CONSTITUTIONAL: Has been having fevers and chills at home.  EYES: Denies diplopia or blurry vision.  HEENT: Denies headaches or hearing loss. Denies epistaxis.  CARDIOVASCULAR: Has advanced congestive heart failure with an ejection fraction of 13%. RESPIRATORY: Denies cough, shortness of breath or hemoptysis at present.  GASTROINTESTINAL: Endorses nausea, vomiting and diarrhea.  GENITOURINARY: Denies frequency, urgency or dysuria.  MUSCULOSKELETAL: Denies joint pain, swelling or redness.  INTEGUMENTARY: Denies skin rashes or lesions.  NEUROLOGIC: Denies focal extremity numbness, weakness or tingling.  PSYCHIATRIC: Denies depression or bipolar disorder.  ENDOCRINE: Denies polyuria, polydipsia or polyphagia.  HEMATOLOGIC AND LYMPHATIC: Denies easy bruisability, bleeding or swollen lymph nodes.  ALLERGY AND IMMUNOLOGIC: Denies seasonal allergies or history of immunodeficiency.   PHYSICAL EXAMINATION:  VITAL SIGNS: Temperature 98.8, pulse 96, respirations 26, blood pressure 97/52, pulse oximetry 98%.  GENERAL: Well-developed, well-nourished Caucasian male who appears his stated age, currently in no acute distress.  HEENT: Normocephalic, atraumatic. Extraocular movements  are intact. Pupils equal, round and reactive to light. No scleral icterus. Conjunctivae are pink. No epistaxis noted.  Gross hearing intact. Oral mucosa moist.  NECK: Supple without apparent JVD or lymphadenopathy.  LUNGS: Show basilar rales, otherwise clear to auscultation bilaterally with normal respiratory effort.  CARDIOVASCULAR: S1 and S2. Currently, in regular rate and rhythm. A 2/6 systolic ejection murmur heard.  ABDOMEN: Soft, nontender, nondistended. Bowel sounds positive. No rebound or guarding. No gross organomegaly appreciated.  EXTREMITIES: No clubbing, cyanosis or edema.  NEUROLOGIC: The patient is alert and oriented to time, person and place. Strength is 5 out of 5 in both upper and lower extremities.  GENITOURINARY: No suprapubic tenderness is noted at this time.  SKIN: Warm and dry. No rashes noted.  MUSCULOSKELETAL: No joint redness, swelling or tenderness appreciated.  PSYCHIATRIC: The patient with appropriate affect and appears to have good insight into his current illness.   LABORATORY DATA: Sodium 132, potassium 4.1, chloride 105, CO2 18, BUN 60, creatinine 2.46, glucose 135, total protein 6.4, albumin 1.9, total bilirubin 1.1, alkaline phosphatase 173, AST 50, ALT 60. Troponin of 0.07. TSH 0.49. CBC shows WBC is 13, hemoglobin 9.1, hematocrit 27, platelets 110. PTT 56.4 C. difficile toxin negative. Blood cultures x2 sets negative. Urinalysis shows 3 RBCs per high-power field, 2 WBCs per high-power field, negative for proteinuria. CA 19-9 is 11. AFP is less than 0.7. Lactic acid is 1.3.   IMAGING: CT scan of abdomen and pelvis without contrast shows acute segmental portal vein thrombosis. There is also a 9 cm segment of marked mucosal thickening in the distal sigmoid colon. There is also a new moderate pericardial effusion superimposed upon chronic cardiomegaly.   IMPRESSION: This is a 64 year old Caucasian male with past medical history of hypertension, atrial fibrillation, cardiac arrest, status post defibrillator placement, chronic systolic heart failure with ejection fraction 13%, who  presented to [facility] with protracted nausea, vomiting, diarrhea and fevers.   PROBLEM LIST:  1. Acute renal failure.  2. Hyponatremia, hypovolemic in nature.  3. Metabolic acidosis.  4. Malnutrition, with albumin of 1.9.  5. Anemia, not otherwise specified.  6. Sigmoid colon thickening, infection versus malignancy.   PLAN: The patient presents with a very interesting case. He has been having protracted nausea, vomiting and diarrhea. He appears to have focal colitis on CT scan of the abdomen and pelvis. It appears that he has had protracted dehydration, which has likely led to acute tubular necrosis as the cause of his acute renal failure. Renal function has improved as creatinine is down to 2.46. Sodium has also improved to 132. We recommend continued IV fluid hydration with 0.9 normal saline at 75 mL/hour. We will certainly need to monitor him for signs of worsening heart failure, as his ejection fraction is quite low at 13%. CT scan of the abdomen and pelvis was negative for obstruction; therefore, renal ultrasound not needed at this time. We will send off further serologic workup including SPEP, UPEP, ANA, ANCA antibodies, GBM antibodies, C3, C4, urine for eosinophils. No indication for dialysis at present as renal function is improving. We would recommend avoidance of any further nephrotoxins and dose medications per GFR. Further plan as the patient progresses.   ____________________________ Tama High, MD mnl:lb D: 05/13/2013 11:15:42 ET T: 05/13/2013 11:34:17 ET JOB#: [id number]  cc: Tama High, MD, <Dictator> Tama High MD ELECTRONICALLY SIGNED 06/23/2013 8:51

## 2014-07-17 NOTE — Consult Note (Signed)
Chief Complaint:  Subjective/Chief Complaint Pt c/o feeling bad. Lot of low abd pain. Bloody diarrhea this AM. THus, heparin stopped.   VITAL SIGNS/ANCILLARY NOTES: **Vital Signs.:   20-Feb-15 11:00  Vital Signs Type Routine  Pulse Pulse 134  Respirations Respirations 22  Systolic BP Systolic BP 080  Diastolic BP (mmHg) Diastolic BP (mmHg) 68  Mean BP 86  Pulse Ox % Pulse Ox % 98  Oxygen Delivery Room Air/ 21 %  Pulse Ox Heart Rate 68   Brief Assessment:  GEN uncomfortable   Cardiac Regular   Respiratory clear BS   Gastrointestinal diffuse low abd tenderness with signif tenderness   Lab Results: Routine Chem:  20-Feb-15 03:51   Glucose, Serum  124  BUN 11  Creatinine (comp) 0.96  Sodium, Serum 136  Potassium, Serum  3.3  Chloride, Serum 104  CO2, Serum 26  Calcium (Total), Serum  7.3  Anion Gap  6  Osmolality (calc) 273  eGFR (African American) >60  eGFR (Non-African American) >60 (eGFR values <5mL/min/1.73 m2 may be an indication of chronic kidney disease (CKD). Calculated eGFR is useful in patients with stable renal function. The eGFR calculation will not be reliable in acutely ill patients when serum creatinine is changing rapidly. It is not useful in  patients on dialysis. The eGFR calculation may not be applicable to patients at the low and high extremes of body sizes, pregnant women, and vegetarians.)  Cardiac:  20-Feb-15 03:51   CPK-MB, Serum 1.5 (Result(s) reported on 15 May 2013 at 10:36AM.)  Routine Coag:  20-Feb-15 03:51   Activated PTT (APTT)  66.4 (A HCT value >55% may artifactually increase the APTT. In one study, the increase was an average of 19%. Reference: "Effect on Routine and Special Coagulation Testing Values of Citrate Anticoagulant Adjustment in Patients with High HCT Values." American Journal of Clinical Pathology 2006;126:400-405.)  Routine Hem:  20-Feb-15 03:51   Hemoglobin (CBC)  9.4 (Result(s) reported on 15 May 2013 at  06:49AM.)   Assessment/Plan:  Assessment/Plan:  Assessment Poss liver abscess. Portal vein thrombosis. Colitis.   Plan On Abx/mesalamine. Concern about increasing abd pain and tenderness. CT on admission without contrast. Would like to repeat CT with contrast to see if colitis progressing. Dr. Allen Norris will see patient over the weekend. Thanks.   Electronic Signatures: Verdie Shire (MD)  (Signed [phone] 13:28)  Authored: Chief Complaint, VITAL SIGNS/ANCILLARY NOTES, Brief Assessment, Lab Results, Assessment/Plan   Last Updated: 20-Feb-15 13:28 by Verdie Shire (MD)

## 2014-07-17 NOTE — Consult Note (Signed)
PATIENT NAME:  Derek Barnes, Derek Barnes MR#:  [id number] DATE OF BIRTH:  October 18, 1950  DATE OF CONSULTATION:  05/13/2013  CONSULTING PHYSICIAN: Wallace Cullens, M.D.   REASON FOR REFERRAL: Portal vein thrombosis, liver lesions, abnormal CT.   HISTORY OF PRESENT ILLNESS: The patient is a 64 year old white male with a known history of congestive heart failure with cardiomyopathy and atrial fibrillation who started having some flulike symptoms with coughing and fever followed by nausea, vomiting, and then diarrhea. The respiratory symptoms resolved, but the nausea and the diarrhea persisted. He was found to feel weak and dizzy. As a result, the patient came to the Emergency Room where he was found to be profoundly hypotensive with initial blood pressure of only 56/38. He was also found to be in renal failure. The patient was then brought in for evaluation. During the work-up, CT scan showed multiple liver lesions. Acute portal vein thrombosis and abnormal sigmoid inflammation consistent with colitis. Therefore, I was asked to see the patient.   The patient is starting to feel better. Diarrhea persists. He denies any significant abdominal pain. He still has fevers as well.   PAST MEDICAL HISTORY: Notable for atrial fibrillation. He had a pacemaker placed. He also had a recent history of cardiac arrest requiring defibrillator. His ejection fraction is only 13%. He has had other shoulder, hernia, knee surgeries.   SOCIAL HISTORY: The patient denies any alcohol or tobacco.   FAMILY HISTORY: Notable for heart attack and cancer.   ALLERGIES: PENICILLIN.   MEDICATIONS: The patient takes atorvastatin 40 mg daily, carvedilol 25 mg daily, digoxin 0.125 mg daily, isosorbide mononitrate 30 mg daily, metoprolol 25 mg daily, Ramipril 5 mg twice a day, sotalol 80 mg twice a day and Aldactone 25 mg daily,  Lasix 40 mg twice a day.   He thought for sure he was on blood thinners for his atrial fibrillation, but I do not have any  documentation of this.   REVIEW OF SYSTEMS: Notable for fevers and chills and fatigue and weakness. Possible weight loss. Recently he has had some blurred vision. There is no hearing changes. He was having some shortness of breath and coughing but that has resolved since. There is no chest pain or palpitation.  GASTROINTESTINAL: Notable for nausea, vomiting, and diarrhea, but no abdominal pain.   The rest of the review of symptoms is negative.   PHYSICAL EXAMINATION: GENERAL: The patient is currently afebrile, pulse was 88, blood pressure 120/90, pulse is 98, heart rate is 88.  HEENT: Normocephalic, atraumatic head, anicteric.  NECK: Supple.  CARDIOVASCULAR: Regular rhythm and rate. There is a pacemaker underneath the skin.  LUNGS: Clear bilaterally.  ABDOMEN: Soft. I did not appreciate any obvious tenderness today. There is no rebound or guarding. There is no hepatomegaly. He did have some hypoactive bowel sounds.  EXTREMITIES: No edema.  NEUROLOGICAL: Examination is nonfocal.  SKIN: Negative.   LABORATORY DATA: Sodium 132, potassium 4.1, BUN 60, creatinine is 2.46, glucose 135, hemoglobin A1c was 7.7. Troponin level is 0.07, TSH is 0.49. White count 13.3, hemoglobin 9.1. The patient is on a heparin drip.    Echocardiogram was just done, the results are pending at this point.   CT showed acute portal vein thrombosis. There is 7 cm thickening of the distal sigmoid colon. There is some pericardia effusion, there is some liver lesions. The tumor markers have been sent and they been negative so far.   IMPRESSION: This is a patient with three different findings, one  is segmental colitis. When I talked to him, he recalled having a colonoscopy recently. I reviewed his old chart; he had a colonoscopy by Dr. Lemar Livings in 2012. At the time he had evidence of active colitis involving the sigmoid and descending colon, which is the same location that he has now. There was no evidence of polyps or evidence  of cancer. It is unclear what medicine he was treated at that time, but he said it did not work. Thus, this CT finding may represent an inflammatory process not a malignant process. It may not be infectious either. Unfortunately, because of his severe cardiomyopathy the patient will be high risk for any procedures to do any biopsies. We will review his most recent echocardiogram that was done today.   He has portal vein thrombosis, which is relatively new. I agree with heparin. There are multiple causes of portal vein thromboses including sepsis, cirrhosis which we do not see any evidence of at this time. Other etiologies include cancer or inflammatory bowel disease or myeloproliferative proliferative disease. We need to figure out what caused the portal vein thrombosis in this patient.   The patient also has multiple liver lesions which could be the abscess or cancer. The tumor markers have been negative. If it is abscess, then, hopefully, he will respond to broad-spectrum antibiotics. If he does not, then either biopsy or aspiration of these lesions of the liver may be useful. Because of the pacemaker, we can not do an MRI. We could possibly do an ultrasound to look at the liver again.   For now we may consider using mesalamine for the colitis and see whether that will help with the diarrhea. If the diarrhea does not respond, then we could possibly consider doing a flexible sigmoidoscopy without sedation and do biopsies off the heparin. It could be considered later if the patient does not respond to mesalamine.   Thank you for the referral.    ____________________________ Ezzard Standing. Bluford Kaufmann, MD pyo:sg D: 05/14/2013 09:46:19 ET T: 05/14/2013 10:25:18 ET JOB#: [id number]  cc: Ezzard Standing. Bluford Kaufmann, MD, <Dictator> Wallace Cullens MD ELECTRONICALLY SIGNED 05/18/2013 9:13

## 2014-07-17 NOTE — Discharge Summary (Signed)
PATIENT NAME:  Derek Barnes, Derek Barnes MR#:  [id number] DATE OF BIRTH:  [birth year]  DATE OF ADMISSION:  05/12/2013 DATE OF DISCHARGE:  05/20/2013  DISCHARGE DIAGNOSES: 1.  Liver abscess from gastrointestinal origin, bacteremia. Needs Zosyn IV for a long time. Follow with infectious disease clinic.  2.  Portal vein thrombosis. Oncology followed the patient and suggested it is most likely due to infection, so no need to have anticoagulation for that.  3.  Atrial fibrillation, converted to normal sinus rhythm with amiodarone. Needs oral now. Cardiology was following and does not suggest to start on anticoagulation because of gastrointestinal bleed issue. 4.  Systolic congestive heart failure.   CONDITION ON DISCHARGE: Stable.   CODE STATUS: Full code.   MEDICATIONS ON DISCHARGE: 1.  Mesalamine 400 mg oral delayed-release capsule 2 capsules 2 times a day for 20 days.  2.  Metronidazole 500 mg oral every 12 hours for 40 days.  3.  Tamsulosin 0.4 mg oral capsule once a day. 4.  Ramipril 5 mg oral capsule once a day.  5.  Amiodarone 200 mg oral 2 times a day.  6.  Digoxin 125 mcg oral tablet once a day.  7.  Atorvastatin 40 mg oral tablet once a day.  8.  Carvedilol 3.125 mg oral 2 times a day.  9.  Ceftriaxone 1 gram intravenous every 12 hours for 40 days.  10.  Furosemide 40 mg once a day.  11.  Pantoprazole 40 mg delayed-release tablet once a day. 12.  Isosorbide mononitrate 30 mg oral extended-release tablet once a day.   HOME HEALTH ON DISCHARGE: Yes.   HOME HEALTH SERVICES: Advised to have nurse for IV antibiotic from PICC line.   DIET ON DISCHARGE: Low-sodium diet. Consistency: Regular.   FOLLOWUP: Advised to follow within 2 to 4 weeks with Dr. Sampson Goon, in 1 to 2 weeks with Dr. Mariah Milling, and in 2 to 4 weeks in GI clinic to decide further plan of care.  HISTORY OF PRESENT ILLNESS: The patient is a 64 year old Caucasian male with history of chronic systolic CHF (per him, ejection fraction  was very low, about 13%); history of A. fib, status post pacemaker and defibrillator; history of cardiac arrest in the past. He stated that he was doing well until about 3 weeks ago or so. After, he started to develop flulike symptoms including cough, fever which was high-grade, nausea and vomiting and diarrhea. The respiratory symptoms resolved. However, the patient has been having ongoing nausea, vomiting, and diarrhea, which are multiple per day. Over the last couple of days, he has felt very weak and debilitated and dizzy. He still has been taking his medications. He has had poor p.o. intake and stated that he vomits most of the food back up. He came in to hospital where he was found to be profoundly hypotensive with blood pressure of 56/38. Also had multiple beta blockers. He does follow with [facility] for his needs, and those are his medications. Hospitalist service was contacted for further management of this issue.  HOSPITAL COURSE AND STAY: Septic shock and hypovolemic shock. He was dehydrated due to GI losses, nausea, vomiting and diarrhea, responded to IV fluids. Was on Levophed initially in ER. He had WBCs elevated. Likely source was colitis. Also found having liver abscess based on CT abdomen and had septicemia and bacteremia. Both bottles of blood culture initially collected on admission developed Streptococcus anginosus that was susceptible to ceftriaxone. Initially, the patient was started on broad-spectrum antibiotic because of these multiple  findings and sepsis, but later on he had improvement in overall blood pressure with IV fluids and antibiotic treatment. He came off Levophed, and we were  off and he is able to cut down the antibiotics, finally able to manage it with IV Rocephin for his bacteremia and oral metronidazole for his colitis. GI consult was called in, and they suggested to follow once his active infection is resolved. Most likely they might like to do colonoscopy after that. ID and  surgical consults were also called in for this liver abscess issue and any malignancy was ruled out, but they all agreed that as he had liver abscess, he needed long-term antibiotic. As he was improving, there was no plan to drain the abscess or do surgical intervention at this time and advised to follow up with Dr. Sampson Goon in the clinic in 3 to 4 weeks, and as per Dr. Sampson Goon, he might need long-term antibiotic, initially 4 to 5 weeks as IV, and after that he might need oral antibiotic for longer course also. He will decide on further followup after reviewing radiological studies later on.   OTHER MEDICAL ISSUES: 1.  Acute renal failure. This was due to prerenal azotemia and ATN from hypotension. Overall, it resolved with IV fluids and improvement in condition.  2.  Acute bright red blood per rectum in setting of borderline thrombosis and colitis. Initially, he was started on heparin for his portal vein thrombosis, which was found on CT scan of the abdomen, but later on because of bright red blood per rectum it was stopped. GI consult was called in, and they suggested not to do any further intensive work-up at this time because of his infection and suggested to hold anticoagulation and upgrade the diet slowly, which he started tolerating, so after 3 to 4 days GI was okay with restarting heparin IV for A. fib issue and for portal vein thrombosis if needed. Then, we discussed with cardiology and hematologist about these issues. Cardiology was okay without starting any anticoagulation for A. fib, and hematologist suggested no anticoagulation recommended for portal vein thrombosis when it is secondary to infection, so we did not discharge him on any long-term anticoagulation.  3.  Acute sigmoid colitis: As mentioned above, continued on antibiotics. C. difficile was negative. Stool cultures were negative. Initially, he was started on meropenem but later on switched to oral metronidazole.  4.  Atrial  fibrillation and wide complex tachycardia with chronic systolic heart failure. Cardiology was following the patient and because of tachycardia and A. fib, he was started on amiodarone drip, which helped to convert it to sinus rhythm within 48 hours, so discharge on oral amiodarone. After discussing with cardiology, they suggested not to start on any anticoagulation for long-term because of GI bleed issue and advised to follow up in cardiology clinic later on.  5.  Liver lesions with portal vein clots. As mentioned, antibiotic for long-term and no anticoagulation  6.  Urinary retention. Foley was placed for 1 to 2 days. He had 900 mL of residual urine with some discomfort. Started on Flomax and he improved with that, so we discontinued Foley and discharged him on Flomax.   CONSULTS IN HOSPITAL: GI consult with Dr. Lutricia Feil. Nephrology consult with Dr. Mady Haagensen. ID consult with Dr. Sampson Goon. Cardiology consult with Dr. Julien Nordmann.  IMPORTANT LABORATORY AND RADIOLOGICAL RESULTS: WBC count was 10.4, hemoglobin was 10.5, and platelet count was 113. Lactic acid was 1.3. Glucose was 124, BUN 67, and sodium  was 128 with potassium of 4.2. Troponin was 0.06. Blood culture on 17th of February developed Streptococcus anginosus. Urinalysis was negative. Carcinoembryonic antigen was 1.5, which is normal range. Carbohydrate antigen 19-9 was 11, which is also normal. AFB tumor marker was less than 0.7. CT scan of abdomen and pelvis without contrast on admission on 17th of February was acute segmental portal vein thrombosis; multiple low-density area in periphery of the right lobe of liver could represent area off infection or tumor; 2.9 cm segment of marked mucosal thickening in distal sigmoid colon. C. difficile was negative. Stool culture was negative. Creatinine went up to 2.46 on 18th of February. HIV test negative. Hepatitis A and B were negative. Hepatitis C antibody was negative. Blood cultures repeated on   20th of February were negative. On 24th of February, creatinine 0.81, potassium 4.5. WBC 5.3 and hemoglobin 8.6 with platelet count 146.   TOTAL TIME SPENT ON THIS DISCHARGE: 45 minutes.   ____________________________ Hope Pigeon Elisabeth Pigeon, MD vgv:jcm D: 05/22/2013 13:22:17 ET T: 05/22/2013 14:43:28 ET JOB#: [id number]  cc: Hope Pigeon. Elisabeth Pigeon, MD, <Dictator> Antonieta Iba, MD Ezzard Standing. Bluford Kaufmann, MD Stann Mainland. Sampson Goon, MD Heath Gold [facility] MD ELECTRONICALLY SIGNED 05/24/2013 1:00

## 2014-07-17 NOTE — Consult Note (Signed)
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   He has an extensive cardiac history and sees Dr. Devonne Doughty at [facility] for cardiac care. History is fairly limited as the patient is lethargic. He is on 3 different beta blockers in metoprolol, carvedilol and sotalol. He reports taking a blood thinner (not listed on home meds). He is on Lasix, ramipril and spironolactone. He does have an ICD, history of cardiac arrest. 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 Cardiac catheterization apparently showed no evidence of significant CAD.  He reports being diagnosed with the flu 1-2 weeks ago. Since that time, he reports experiencing nonproductive cough, nausea, vomiting, weakness, diarrhea, fevers and rigors for the past several days. He denies worsening SOB/DOE, cp, PND, orthopnea, LE edema or weight increase. No ICD shocks. His symptoms continued to worsen, and he presented to [facility] ED for further evaluation.   Present Illness There, EKG reveaeled an A-sensed (sinus), V-paced rhythm. He was markedly hypotensive (SBP 50s) and hypovolemic BUN 67.Cr 3.45 (improved w/ IVF hydration). Na 128. Lipase 415. TnI 0.06. Dig level 0.69. Hgb 10.5/Hct 30.4. Abdominal CT showed segmental portal vein thrombosis, liver densities (tumor vs abscess), distal sigmoid colon thickening (c/w colitis) and moderate pericardial effusion. CXR- cardiomegaly, small L pleural effusion. CA 19-9 and AFP markers were WNL. He responded to IVF, but has been started on Levophed for persistent hypotension. TnI 0.05->0.07. TSH WNL. C diff negative. Bld cx NGTD x 2. U/a WNL. Hgb 9/Hct 27-28.1. BUN 60/Cr 2.46 this AM.   1.  Hypertension.  2.  A. Fib, status post pacemaker.  3.  Cardiac arrest, status post defibrillator.  4.  Chronic systolic CHF with EF of about 78% per patient.  5.   History of bilateral knee surgeries, angle surgery, shoulder surgery, hernia repair.    SOCIAL HISTORY: No tobacco, alcohol or drug use. Lives by himself.   FAMILY HISTORY: Mom with myocardial infarction. Cancer also runs in the family.   ALLERGIES: PENICILLIN.   Physical Exam:  GEN no acute distress, thin, disheveled   HEENT red conjunctivae, PERRL, hearing intact to voice   NECK supple  No masses  trachea midline  R IJ IV noted, no appreciable JVD   RESP normal resp effort  clear BS  no use of accessory muscles   CARD Tachycardic  Normal, S1, S2  No murmur   ABD denies tenderness  soft  normal BS   EXTR negative cyanosis/clubbing, positive edema, trace   SKIN normal to palpation, calor to palpation   NEURO follows commands, motor/sensory function intact   PSYCH alert, A+O to time, place, person   Review of Systems:  General: Fatigue  Fever/chills  Weakness   Respiratory: Short of breath   Cardiovascular: No Complaints   Gastrointestinal: Nausea  Diarrhea   Review of Systems: All other systems were reviewed and found to be negative   Home Medications: Medication Instructions Status  spironolactone 25 mg oral tablet 1 tab(s) orally once a day Active  digoxin 125 mcg (0.125 mg) oral tablet 1 tab(s) orally once a day Active  carvedilol 25 mg oral tablet 1 tab(s) orally 2 times a day Active  Metoprolol Succinate ER 25 mg oral tablet, extended release 1 tab(s) orally once a day Active  atorvastatin 40 mg oral tablet 1 tab(s) orally once a day (  at bedtime) Active  sotalol 80 mg oral tablet 1 tab(s) orally 2 times a day Active  furosemide 40 mg oral tablet 1 tab(s) orally 2 times a day Active  isosorbide mononitrate 30 mg oral tablet, extended release 1 tab(s) orally once a day (in the morning) Active  ramipril 5 mg oral capsule 1 cap(s) orally 2 times a day Active   Lab Results:  Hepatic:  17-Feb-15 11:08   Albumin, Serum  1.9  Oncology:  17-Feb-15 11:08    Carcinoembryonic Antigen (CEA) 1.5 (Roche ECLIA methodology       Nonsmokers  <3.9                                                     Smokers     <5.6)  Carcinoembryonic Antigen ========== TEST NAME ==========  ========= RESULTS =========  = REFERENCE RANGE =  CARCINOEMBRYONIC AB   Carbohydrate Antigen 19-9 11 (Roche ECLIA methodology)  CA 19-9 ========== TEST NAME ==========  ========= RESULTS =========  = REFERENCE RANGE =  CA 19-9   AFP, Tumor Marker (Serial Monitoring) <0.7 (Roche ECLIA methodology)  Routine Chem:  17-Feb-15 11:08   BUN  67  Creatinine (comp)  3.45  Lipase  415 (Result(s) reported on 12 May 2013 at 11:33AM.)  18-Feb-15 02:25   BUN  60  Creatinine (comp)  2.46  Cardiac:  17-Feb-15 11:08   Troponin I  0.06 (0.00-0.05 0.05 ng/mL or less: NEGATIVE  Repeat testing in 3-6 hrs  if clinically indicated. >0.05 ng/mL: POTENTIAL  MYOCARDIAL INJURY. Repeat  testing in 3-6 hrs if  clinically indicated. NOTE: An increase or decrease  of 30% or more on serial  testing suggests a  clinically important change)  CPK-MB, Serum 1.3 (Result(s) reported on 12 May 2013 at 02:14PM.)  CK, Total  26 (39-308 NOTE: NEW REFERENCE RANGE  04/27/2013)    15:32   Troponin I 0.05 (0.00-0.05 0.05 ng/mL or less: NEGATIVE  Repeat testing in 3-6 hrs  if clinically indicated. >0.05 ng/mL: POTENTIAL  MYOCARDIAL INJURY. Repeat  testing in 3-6 hrs if  clinically indicated. NOTE: An increase or decrease  of 30% or more on serial  testing suggests a  clinically important change)  CPK-MB, Serum 1.8 (Result(s) reported on 12 May 2013 at 04:20PM.)    19:25   Troponin I  0.07 (0.00-0.05 0.05 ng/mL or less: NEGATIVE  Repeat testing in 3-6 hrs  if clinically indicated. >0.05 ng/mL: POTENTIAL  MYOCARDIAL INJURY. Repeat  testing in 3-6 hrs if  clinically indicated. NOTE: An increase or decrease  of 30% or more on serial  testing suggests a  clinically important change)  CPK-MB,  Serum 2.3 (Result(s) reported on 12 May 2013 at 08:03PM.)  Routine Hem:  17-Feb-15 11:08   Hemoglobin (CBC)  10.5  Hematocrit (CBC)  30.4    19:25   Hemoglobin (CBC)  9.3  Hematocrit (CBC)  28.1  18-Feb-15 02:25   Hemoglobin (CBC)  9.1  Hematocrit (CBC)  27.0   EKG:  Interpretation A-sensed, V-paced rhythm, LAD   Rate 65   Radiology Results: XRay:    17-Feb-15 14:49, Chest Portable Single View  Chest Portable Single View   REASON FOR EXAM:    confirm central line placement  COMMENTS:       PROCEDURE: DXR - DXR PORTABLE CHEST SINGLE  VIEW  - May 12 2013  2:49PM     CLINICAL DATA:  Status post central line placement    EXAM:  PORTABLE CHEST - 1 VIEW    COMPARISON:  DG CHEST 1V dated 05/12/2013; DG CHEST 2V dated  01/23/2011    FINDINGS:  The patient has undergone interval placement of a right internal  jugular venous catheter. The tip of the catheter is obscured by the  cardiac pacemaker defibrillator, but it lies at least in the  midportion of the SVC. There is no evidence of a postprocedure  pneumothorax. The permanent pacemaker-defibrillator is unchanged in  appearance and position. The cardiac silhouette remains enlarged.  The left hemidiaphragm is less well demonstrated today. The central  pulmonary vascularity is prominent.     IMPRESSION:  There is no evidence of a postprocedure complication following  placement of a right internal jugular venous catheter. Slightly  increased prominence of the cardiac silhouette and central pulmonary  vascularity may reflect low grade CHF.    Electronically Signed    By: David  [place]    On: 05/12/2013 14:53         Verified By: DAVID A. [place], M.D., MD  CT:    17-Feb-15 16:56, CT Abdomen and Pelvis Without Contrast  CT Abdomen and Pelvis Without Contrast   REASON FOR EXAM:    (1) n/v/d and some abd pain. eval; (2) n/v/d eval.    abd pain.  COMMENTS:       PROCEDURE: CT  - CT ABDOMEN AND PELVIS W0  - May 12 2013   4:56PM     CLINICAL DATA:  Vomiting and diarrhea for 3 weeks. Abdominal pain.  Progressive worsening of weakness.    EXAM:  CT ABDOMEN AND PELVIS WITHOUT CONTRAST    TECHNIQUE:  Multidetector CT imaging of the abdomen and pelvis was performed  following the standard protocol without intravenous contrast.  COMPARISON:  CT scan dated 01/25/2011    FINDINGS:  There is a new moderate pericardial effusion. The patient has  chronic cardiomegaly.    There is evidence of acute thrombus in multiple segments of the  portal vein in the liver with peripheral areas of hypodensity in the  right lobe which could represent areas of abscess or tumor. Does the  patient have a history of malignancy?    The biliary tree, spleen, pancreas, adrenal glands, and kidneys are  normal. Foley catheter is present in the otherwise normal bladder.    No free air or free fluid in the abdomen. There is a 9 cm segment of  marked prominence of the mucosa of the distal sigmoid colon with  multiple diverticula in that area. This could represent colitis. It  does not appear to be diverticulitis or a mass.    Large and small bowel are otherwise normal including the terminal  ileum and appendix. No acute osseous abnormality.     IMPRESSION:  1. Acute segmental portal vein thrombosis. Multiple low-density  areas in the periphery of the right lobe which could represent areas  of infection or tumor.  2. 9 cm segment of marked mucosal thickening in the distal sigmoid  colon. This appearance is similar to that seen on the prior exam.  The possibility of recurrent focal colitis should be considered. I  think this isless likely to represent diverticulitis or tumor.  3. New moderate pericardial effusion superimposed on chronic  cardiomegaly.      Electronically Signed    By: Geanie Cooley  M.D.    On: 05/12/2013 17:23         Verified By: Gwynn Burly, M.D.,    Penicillin: Rash  Vital Signs/Nurse's  Notes: **Vital Signs.:   18-Feb-15 08:03  Vital Signs Type Routine  Temperature Source oral  Pulse Pulse 96  Respirations Respirations 41  Systolic BP Systolic BP 109  Diastolic BP (mmHg) Diastolic BP (mmHg) 51  Mean BP 70  Pulse Ox % Pulse Ox % 99  Oxygen Delivery Room Air/ 21 %  Pulse Ox Heart Rate 96  *Intake and Output.:   Shift 18-Feb-15 15:00  Grand Totals Intake:  118.6 Output:      Net:  118.6 24 Hr.:  118.6  Heparin      In:  24.8  IV (Primary)      In:  18.8  IV (Primary)      In:  75  Length of Stay Totals Intake:  1576.9 Output:  2450    Net:  -873.1    Impression 64yo Caucasian male w/ PMHx s/f chronic systolic CHF, 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   1. Shock Hypovolemic +/- septic. Leukocytosis, febrile, n/v/d, ? colitis on CT. GI losses, decrease fluid intake. Continued taking several antihypertensives (carvedilol, metoprolol) and diuretics (lasix, spironolactone) during this time as well further driving down BP (SBP 42J in the ED). Question if BP is already soft at baseline w/ low output CHF. Cardiogenic shock less likely as he responded to fluid. Responded to fluid resuscitation, however is requiring Levophed.  -- Treat hypovolemia and infection, wean pressors as tolerated  2. Moderate pericardial effusion Noted on CT, will need TTE to further assess. Question transudative from chronic CHF, vs malignant or infectious. Low suspicion for tamponade.  -- Evaluate hemodynamics on echo  3. Chronic systolic CHF/dilated cardiomyopathy s/p ICD EF 13% per patient. No records, he is followed by Dr. Pernell Dupre at [facility]. Volume depleted from n/v/d, decreased fluid intake. Unclear why patient on 3 different beta blockers. These, in addition to Lasix, ACEi, spiro held d/t hypotension. He is s/p ICD, does have a history of cardiac arrest. Denies h/o CAD.  -- Agree w/ holding  antihypertensives and diuretics while shock managed.  -- Continue to follow volume status- I/Os, daily weights, stable at present  4. Atrial fibrillation Sotalol held. Renally cleared. Evidence of prerenal AKI. Sotalol likely still in system. No evidence of anticoagulation on home meds. Maintaining SR w/ intermittent A-sensing, V-pacing.  -- Continue monitoring -- Would consider amiodarone if evidence of arrhythmia -- Anticoagulation? Will need to overlap w/ portal vein thrombosis->Coumadin   Plan 5. Hypoalbuminemia Albumin 1.9 on arrival. Concerning for sepsis vs malignancy vs hepatic disease. Predisposing to 3rd spacing, edema, effusion, etc.  -- Management per primary team  6. Portal vein thrombosis Noted on CT. ? hypercoagulable from occult malignancy. Sluggish flow from low output CHF or obstruction contributing. No mention of cirrhosis. LFTs not markedly abnormal. -- Continue heparin -- Will need to make sure no need for pericardiocentesis before bridging to Coumadin  7. Hepatic lesions on CT Malignancy vs abscess. AFP, CA 19-9 WNL.  -- Work-up per primary team/GI  8. Sigmoid thickening, ? colitis Noted on CT. Pt c/o n/v/d prior to arrival. Rigors on exam. Leukocytosis, fever of 101.8 this AM. -- Started on vanc, cipro  -- GI consulted   Electronic Signatures for Addendum Section:  Marykay Lex (MD) (Signed Addendum  18-Feb-15 10:12)  Very unfortunate gentleman with what appears to be long-standign NICM (~EF~13% by his report) who is followed by Dr. Devonne Doughty (CHF Specialist) with h/o Afib & VT, s/p AICD -- was on stardard Rx (Carvedilol, Ramipril, furosemide, spironolactone - with mild gynecomastia, digoxin & Sotalol fo Afib/VT, also on Toprol ?) --> admitted after prolonged bout of "flu-like" symptoms with N/V & poor PO intake, Fever etc.  Continued to take home meds including diuretics despite poor PO.  He was admitted - clearly hypovolemic with ARF, & hypotension /  shock.  CT-scan results aer concerning with pericardial effusion, Portal Vein thrombosis & possible hepatic abscess vs. malignancy.  Albumin is 1.7 -- ? malnutrition vs. sign of hepatic dysfunction. He is warm to touch & febrile this AM.  HR is relatively controlled & mostly in SR with intermittent PVCs & paced beats.    Clinically, there is no evidence of tamponade physiolology (he is hypovolemic) & the "shock" appears to be more related to Fever & dehydration.  Will review Echo for findings re: EF & effusion. For now, continue to hold CHF meds until off pressors.  Avoid XS volume repletion.  Sotalol is renally cleared, so would not restart until return of renal function --> if arrythmias occur, would preferentially use Amiodarone for short term Rx.   He mentions being on an oral anticoagulant, but there is no pill bottle -- he will likely need to be on anticoagulation for PV thrombosis (for now would use IV Heparin until we are sure that no invasive procedures will be required.  With albumin of 1.7, I am not sure that warfarin would be a great option vs. NOACs (but no data on NOAC use with PV thrmobosis).  We will follow along.   Electronic Signatures: Odella Aquas A (PA-C)  (Signed 18-Feb-15 09:31)  Authored: General Aspect/Present Illness, History and Physical Exam, Review of System, Home Medications, Labs, EKG , Radiology, Allergies, Vital Signs/Nurse's Notes, Impression/Plan Marykay Lex (MD)  (Signed 18-Feb-15 10:12)  Co-Signer: General Aspect/Present Illness, History and Physical Exam, Review of System, Home Medications, Labs, EKG , Radiology, Allergies, Vital Signs/Nurse's Notes, Impression/Plan   Last Updated: 18-Feb-15 10:12 by Marykay Lex (MD)

## 2014-07-17 NOTE — Consult Note (Signed)
Chief Complaint:  Subjective/Chief Complaint Cros cover for Dr. Bluford Kaufmann. The patient is feeling better today. No further pain. CT scan did not show any new findings. Still has diarrhea daily.   VITAL SIGNS/ANCILLARY NOTES: **Vital Signs.:   21-Feb-15 09:00  Vital Signs Type Routine  Pulse Pulse 92  Respirations Respirations 24  Systolic BP Systolic BP 110  Diastolic BP (mmHg) Diastolic BP (mmHg) 62  Mean BP 78  Pulse Ox % Pulse Ox % 96  Oxygen Delivery Room Air/ 21 %  Pulse Ox Heart Rate 86   Brief Assessment:  GEN well developed, well nourished, no acute distress   Respiratory normal resp effort   Gastrointestinal Normal   Additional Physical Exam Alert and orientated times 3   Assessment/Plan:  Assessment/Plan:  Assessment Patient with less abdominal pain. Continues to have diarrhea. Hb stable. WBC's. Last blood cultures negative.   Plan Continue supportive care. Follow Hb and WBC's. Will hold off on any needle drainage of the liver lesions until more stable.   Electronic Signatures: [name] (MD)  (Signed 21-Feb-15 10:23)  Authored: Chief Complaint, VITAL SIGNS/ANCILLARY NOTES, Brief Assessment, Assessment/Plan   Last Updated: 21-Feb-15 10:23 by [name] (MD)

## 2014-07-17 NOTE — Consult Note (Signed)
History of Present Illness:  Reason for Consult Portal vein thrombosis.   HPI   Patient is a 64 year old male with past medical history of significant CHF with ejection fraction of approximately 13%. Patient was doing well until approximately 3 weeks ago when he developed flulike symptoms including cough, high-grade fevers, nausea and vomiting. He also reported bloody diarrhea per at that time, he was feeling very weak and occasionally dizzy. Upon evaluation in the emergency room, he was found to be hypotensive. Further workup revealed positive blood cultures, and liver lesions suspicious for abscesses. He was also incidentally noted to have a portal vein thrombosis. Currently, he feels well and nearly back to his baseline. He denies any neurologic complaints. He has no further fevers. He denies any chest pain or shortness of breath. He denies any abdominal pain. He denies any further nausea, vomiting, constipation, or diarrhea. He currently is not having any melena or hematochezia, but does admit he has bouts of bloody diarrhea anywhere from 0-5 times per week. He has no urinary complaints. Patient otherwise feels well and offers no further specific complaints  PFSH:  Additional Past Medical and Surgical History CHF, with ejection fraction of 30%, atrial fibrillation status post pacemaker and defibrillator, history of cardiac arrest, bilateral knee surgery, ankle surgery, shoulder surgery, hernia repair.  Social history: Patient denies tobacco or alcohol.  Family history: MI, "cancer"   Review of Systems:  Performance Status (ECOG) 0   Review of Systems   As per HPI. Otherwise, 10 point system review was negative.   NURSING NOTES: **Vital Signs.:   23-Feb-15 20:08   Vital Signs Type: Routine   Temperature Temperature (F): 97.8   Celsius: 36.5   Temperature Source: oral   Pulse Pulse: 70   Respirations Respirations: 18   Systolic BP Systolic BP: 563   Diastolic BP (mmHg)  Diastolic BP (mmHg): 66   Mean BP: 84   Pulse Ox % Pulse Ox %: 100   Pulse Ox Activity Level: At rest   Oxygen Delivery: Room Air/ 21 %   Physical Exam:  Physical Exam General: Well-developed, well-nourished, no acute distress. Eyes: Pink conjunctiva, anicteric sclera. HEENT: Normocephalic, moist mucous membranes, clear oropharnyx.  right IJ noted. Lungs: Clear to auscultation bilaterally. Heart: Regular rate and rhythm. No rubs, murmurs, or gallops. Abdomen: Soft, nontender, nondistended. No organomegaly noted, normoactive bowel sounds. Musculoskeletal: No edema, cyanosis, or clubbing. Neuro: Alert, answering all questions appropriately. Cranial nerves grossly intact. Skin: No rashes or petechiae noted. Psych: Normal affect. Lymphatics: No cervical, calvicular, axillary or inguinal LAD.    Penicillin: Rash    spironolactone 25 mg oral tablet: 1 tab(s) orally once a day, Status: Active, Quantity: 0, Refills: None   digoxin 125 mcg (0.125 mg) oral tablet: 1 tab(s) orally once a day, Status: Active, Quantity: 0, Refills: None   carvedilol 25 mg oral tablet: 1 tab(s) orally 2 times a day, Status: Active, Quantity: 0, Refills: None   Metoprolol Succinate ER 25 mg oral tablet, extended release: 1 tab(s) orally once a day, Status: Active, Quantity: 0, Refills: None   atorvastatin 40 mg oral tablet: 1 tab(s) orally once a day (at bedtime), Status: Active, Quantity: 0, Refills: None   sotalol 80 mg oral tablet: 1 tab(s) orally 2 times a day, Status: Active, Quantity: 0, Refills: None   furosemide 40 mg oral tablet: 1 tab(s) orally 2 times a day, Status: Active, Quantity: 0, Refills: None   isosorbide mononitrate 30 mg oral tablet, extended release: 1  tab(s) orally once a day (in the morning), Status: Active, Quantity: 0, Refills: None   ramipril 5 mg oral capsule: 1 cap(s) orally 2 times a day, Status: Active, Quantity: 0, Refills: None  Laboratory Results:  Hepatic:  22-Feb-15  05:29   Bilirubin, Total 0.7  Alkaline Phosphatase  150 (45-117 NOTE: New Reference Range 02/13/13)  SGPT (ALT) 34  SGOT (AST)  41  Total Protein, Serum  5.2  Albumin, Serum  1.6  Routine Chem:  22-Feb-15 05:29   Glucose, Serum  107  BUN  6  Creatinine (comp) 0.83  Sodium, Serum 136  Potassium, Serum  3.4  Chloride, Serum 105  CO2, Serum 27  Calcium (Total), Serum  7.4  Osmolality (calc) 270  eGFR (African American) >60  eGFR (Non-African American) >60 (eGFR values <48mL/min/1.73 m2 may be an indication of chronic kidney disease (CKD). Calculated eGFR is useful in patients with stable renal function. The eGFR calculation will not be reliable in acutely ill patients when serum creatinine is changing rapidly. It is not useful in  patients on dialysis. The eGFR calculation may not be applicable to patients at the low and high extremes of body sizes, pregnant women, and vegetarians.)  Result Comment LABS - This specimen was collected through an   - indwelling catheter or arterial line.  - A minimum of 58mls of blood was wasted prior    - to collecting the sample.  Interpret  - results with caution.  Result(s) reported on 17 May 2013 at 05:46AM.  Anion Gap  4  Routine Hem:  22-Feb-15 05:29   WBC (CBC) 5.2  RBC (CBC)  2.88  Hemoglobin (CBC)  8.7  Hematocrit (CBC)  25.7  Platelet Count (CBC)  118  MCV 89  MCH 30.2  MCHC 33.9  RDW  14.6  Neutrophil % 70.4  Lymphocyte % 15.5  Monocyte % 11.2  Eosinophil % 2.2  Basophil % 0.7  Neutrophil # 3.7  Lymphocyte #  0.8  Monocyte # 0.6  Eosinophil # 0.1  Basophil # 0.0   Radiology Results: CT:    20-Feb-15 17:11, CT Abdomen and Pelvis With Contrast  CT Abdomen and Pelvis With Contrast   REASON FOR EXAM:    (1) colitis and increased pain; (2) same  COMMENTS:       PROCEDURE: CT  - CT ABDOMEN / PELVIS  W  - May 15 2013  5:11PM     CLINICAL DATA:  Bloody diarrhea    EXAM:  CT ABDOMEN AND PELVIS WITH  CONTRAST    TECHNIQUE:  Multidetector CT imaging of the abdomen and pelvis was performed  using the standard protocol following bolus administration of  intravenous contrast.  CONTRAST:  100 cc of Isovue 370    COMPARISON:  05/12/2013    FINDINGS:  There is no pleural effusion identified. Small pericardial effusion  is identified.    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 There is nonocclusive clot identified  within the proximal portion of the left hepatic lobe portal vein  branch. Multifocal, ill-defined areas of peripheral predominant  low-attenuation are again identified. These appears progressive with  new areas in the posterior right hepatic lobe and increasing areas  within the anterior lateral right hepatic lobe. Within the anterior  right hepatic lobe there is a low attenuation area measuring 5 x 3.2  cm. Within this area of there is a more central area of fluid  attenuation  measuring 1 cm. The gallbladder appears normal. There is  no biliary dilatation. Normalappearance of the pancreas. The spleen  measures 11 cm in cranial caudal dimension.    The adrenal glands are both normal. Normal appearance of the right  kidney. Left renal cysts are again noted. The urinary bladder is  collapsed around a Foley catheter balloon. There is prostate gland  enlargement. The seminal vesicles appear normal.    Calcified atherosclerotic disease affects the abdominal aorta. There  is no aneurysm. Portahepatic lymph node measures 1.1 cm and is  similar to previous exam. Multiple small retroperitoneal lymph nodes  are identified. No pelvic or inguinal adenopathy identified.  The stomach is normal. The small bowel loops have a normal course  and caliber without obstruction. Normal appearance of the proximal  colon. Multiple distal colonic diverticula identified. Similar  appearance of abnormal wall thickening and mild  pericolonic  inflammation involving the sigmoid colon. 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    A small amount of free fluid is identified within the pelvis which  appears new from previous exam. No abscess identified.    Review of the visualized osseous structures is significant for  lumbar spondylosis. Schmorl snow deformity is identified at the L4  level. Bilateral L5 pars defects are identified.   IMPRESSION:  1. 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  There is no evidence for progression into the extrahepatic portal  vein. The splenic vein a remains patent.  2. Progressive areas of low attenuation within the right hepatic  lobe may represent areas of infarct or abscess. Less likely these  could represent foci of metastatic disease.  3. New small amount of free fluid identified within the dependent  portion of the pelvis.  4. Similar appearance of abnormal wall thickening involving the  sigmoid colon which may represent segmental colitis or acute  diverticulitis. 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  5. Pericardial effusion.  These results will be called to the ordering clinician or  representative by the Radiologist Assistant, and communication  documented in the PACS Dashboard.      Electronically Signed    By: Kerby Moors M.D.    On: 05/15/2013 17:27         Verified By: Angelita Ingles, M.D.,   Assessment and Plan: Impression:   Portal vein thrombosis. Plan:   1. Portal vein thrombosis:  There are no absolute recommendations for an incidentally found portal vein thrombosis unrelated to malignancy.  Patient is also asymptomatic in regards to his diagnosis. 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 Patient also reports that his cardiologist discussed putting him on Coumadin several years ago, because of his decreased ejection fraction, but decided not to given the increased risk of bleeding. At this point, would recommend no anticoagulation unless patient becomes symptomatic. Repeat CT scan in 4-6 weeks to further evaluate clot as well as assess presumed infectious liver lesions. If patient became symptomatic or clot increased in size in that time frame and patient had no further bleeding, would consider anticoagulation at that point. Patient does not require a hypercoagulable workup. No further intervention is needed. consult, will follow.  Electronic Signatures: Delight Hoh (MD)  (Signed [phone] 08:55)  Authored: HISTORY  OF PRESENT ILLNESS, PFSH, ROS, NURSING NOTES, PE, ALLERGIES, HOME MEDICATIONS, LABS, OTHER RESULTS, ASSESSMENT AND PLAN   Last Updated: 24-Feb-15 08:55 by Delight Hoh (MD)

## 2014-07-17 NOTE — Consult Note (Signed)
Chief Complaint:  Subjective/Chief Complaint Diarrhea stopped. No bleeding. Off pressors.   VITAL SIGNS/ANCILLARY NOTES: **Vital Signs.:   19-Feb-15 06:00  Pulse Pulse 96  Respirations Respirations 23  Systolic BP Systolic BP 202  Diastolic BP (mmHg) Diastolic BP (mmHg) 50  Mean BP 69  Pulse Ox % Pulse Ox % 99  Oxygen Delivery Room Air/ 21 %  Pulse Ox Heart Rate 94   Brief Assessment:  GEN no acute distress   Cardiac Regular   Respiratory clear BS   Gastrointestinal Normal   Lab Results: Routine Chem:  19-Feb-15 04:13   Result Comment LABS - This specimen was collected through an   - indwelling catheter or arterial line.  - A minimum of 71mls of blood was wasted prior    - to collecting the sample.  Interpret  - results with caution.  Result(s) reported on 14 May 2013 at 06:11AM.  Result Comment LABS - This specimen was collected through an   - indwelling catheter or arterial line.  - A minimum of 74mls of blood was wasted prior    - to collecting the sample.  Interpret  - results with caution.  Result(s) reported on 14 May 2013 at 05:27AM.  Glucose, Serum  112  BUN  25  Creatinine (comp)  1.33  Sodium, Serum  134  Potassium, Serum 3.5  Chloride, Serum 105  CO2, Serum 24  Calcium (Total), Serum  7.7  Anion Gap  5  Osmolality (calc) 273  eGFR (African American) >60  eGFR (Non-African American)  58 (eGFR values <36mL/min/1.73 m2 may be an indication of chronic kidney disease (CKD). Calculated eGFR is useful in patients with stable renal function. The eGFR calculation will not be reliable in acutely ill patients when serum creatinine is changing rapidly. It is not useful in  patients on dialysis. The eGFR calculation may not be applicable to patients at the low and high extremes of body sizes, pregnant women, and vegetarians.)  Routine Coag:  19-Feb-15 04:13   Activated PTT (APTT)  81.3 (A HCT value >55% may artifactually increase the APTT. In one study, the  increase was an average of 19%. Reference: "Effect on Routine and Special Coagulation Testing Values of Citrate Anticoagulant Adjustment in Patients with High HCT Values." American Journal of Clinical Pathology 2006;126:400-405.)  Routine Hem:  19-Feb-15 04:13   WBC (CBC) 7.2  RBC (CBC)  3.03  Hemoglobin (CBC)  9.4  Hematocrit (CBC)  27.0  Platelet Count (CBC)  106  MCV 89  MCH 31.1  MCHC 35.0  RDW  15.1  Neutrophil % 78.2  Lymphocyte % 12.0  Monocyte % 8.9  Eosinophil % 0.7  Basophil % 0.2  Neutrophil # 5.6  Lymphocyte #  0.9  Monocyte # 0.6  Eosinophil # 0.0  Basophil # 0.0 (Result(s) reported on 14 May 2013 at 05:27AM.)   Assessment/Plan:  Assessment/Plan:  Assessment Poss liver abscesses. Portal vein thrombosis. Segmental colitis. On Abx. On heparin. On mesalamine.   Plan Doubt malignancy of colon since patient had colonoscopy 2 yrs ago that showed active colitis at same location. Due to poor EF, poor candidate for repeat colonoscopy with bx.Agree that aspiration or bx of liver lesions may be necessary if condition does not improve. Will follow. Thanks.   Electronic Signatures: Verdie Shire (MD)  (Signed 19-Feb-15 08:10)  Authored: Chief Complaint, VITAL SIGNS/ANCILLARY NOTES, Brief Assessment, Lab Results, Assessment/Plan   Last Updated: 19-Feb-15 08:10 by Verdie Shire (MD)

## 2014-08-16 ENCOUNTER — Other Ambulatory Visit: Payer: Self-pay

## 2014-08-16 ENCOUNTER — Emergency Department
Admission: EM | Admit: 2014-08-16 | Discharge: 2014-08-16 | Disposition: A | Discharge status: Home / Self Care | Payer: BC Managed Care – PPO | Attending: Emergency Medicine | Admitting: Emergency Medicine

## 2014-08-16 ENCOUNTER — Emergency Department: Payer: BC Managed Care – PPO

## 2014-08-16 DIAGNOSIS — E8770 Fluid overload, unspecified: Secondary | ICD-10-CM | POA: Diagnosis not present

## 2014-08-16 DIAGNOSIS — R0602 Shortness of breath: Secondary | ICD-10-CM | POA: Diagnosis present

## 2014-08-16 DIAGNOSIS — I5042 Chronic combined systolic (congestive) and diastolic (congestive) heart failure: Secondary | ICD-10-CM | POA: Insufficient documentation

## 2014-08-16 DIAGNOSIS — N189 Chronic kidney disease, unspecified: Secondary | ICD-10-CM | POA: Insufficient documentation

## 2014-08-16 DIAGNOSIS — Z880 Allergy status to penicillin: Secondary | ICD-10-CM | POA: Insufficient documentation

## 2014-08-16 HISTORY — DX: Heart failure, unspecified: I50.9

## 2014-08-16 HISTORY — DX: Other cardiomyopathies: I42.8

## 2014-08-16 HISTORY — DX: Acute myocardial infarction, unspecified: I21.9

## 2014-08-16 HISTORY — DX: Abnormal findings on diagnostic imaging of heart and coronary circulation: R93.1

## 2014-08-16 HISTORY — DX: Chronic kidney disease, unspecified: N18.9

## 2014-08-16 LAB — TROPONIN I: Troponin I: 0.03 ng/mL (ref <0.031)

## 2014-08-16 LAB — CBC
HCT: 38 % — ABNORMAL LOW (ref 40.0–52.0)
HEMOGLOBIN: 13.1 g/dL (ref 13.0–18.0)
MCH: 31.3 pg (ref 26.0–34.0)
MCHC: 34.3 g/dL (ref 32.0–36.0)
MCV: 91.2 fL (ref 80.0–100.0)
Platelets: 168 10*3/uL (ref 150–440)
RBC: 4.17 MIL/uL — ABNORMAL LOW (ref 4.40–5.90)
RDW: 14.2 % (ref 11.5–14.5)
WBC: 7 10*3/uL (ref 3.8–10.6)

## 2014-08-16 LAB — BASIC METABOLIC PANEL
ANION GAP: 8 (ref 5–15)
BUN: 26 mg/dL — AB (ref 6–20)
CALCIUM: 8.7 mg/dL — AB (ref 8.9–10.3)
CO2: 29 mmol/L (ref 22–32)
Chloride: 95 mmol/L — ABNORMAL LOW (ref 101–111)
Creatinine, Ser: 1.61 mg/dL — ABNORMAL HIGH (ref 0.61–1.24)
GFR calc Af Amer: 52 mL/min — ABNORMAL LOW (ref >60)
GFR calc non Af Amer: 45 mL/min — ABNORMAL LOW (ref >60)
Glucose, Bld: 152 mg/dL — ABNORMAL HIGH (ref 65–99)
Potassium: 4.4 mmol/L (ref 3.5–5.1)
Sodium: 132 mmol/L — ABNORMAL LOW (ref 135–145)

## 2014-08-16 LAB — BRAIN NATRIURETIC PEPTIDE: B Natriuretic Peptide: 56 pg/mL (ref 0.0–100.0)

## 2014-08-16 MED ORDER — FUROSEMIDE 10 MG/ML IJ SOLN
40.0000 mg | Freq: Once | INTRAMUSCULAR | Status: AC
  Administered 2014-08-16: 40 mg via INTRAVENOUS

## 2014-08-16 MED ORDER — FUROSEMIDE 10 MG/ML IJ SOLN
INTRAMUSCULAR | Status: AC
  Administered 2014-08-16: 40 mg via INTRAVENOUS
  Filled 2014-08-16: qty 4

## 2014-08-16 NOTE — ED Notes (Signed)
Pt c/o increased SOB with generalized weakness for the past 3 days..states he is being treated for CHF and was told to come to the ED for lasix treatment.the patient is in NAD, respirations WNL

## 2014-08-16 NOTE — Discharge Instructions (Signed)
As we discussed, we gave you a dose of Lasix 40 mg IV as per Dr. Pernell Dupre recommendation.  Continue taking her regular medications and follow-up with Dr. Pernell Dupre at the next available opportunity in clinic, at least by Thursday as you and he discussed.  If he develop any new or worsening symptoms that concern you, please go to nearest emergency department for further evaluation.     Heart Failure Heart failure means your heart has trouble pumping blood. This makes it hard for your body to work well. Heart failure is usually a long-term (chronic) condition. You must take good care of yourself and follow your doctor's treatment plan. HOME CARE  Take your heart medicine as told by your doctor.  Do not stop taking medicine unless your doctor tells you to.  Do not skip any dose of medicine.  Refill your medicines before they run out.  Take other medicines only as told by your doctor or pharmacist.  Stay active if told by your doctor. The elderly and people with severe heart failure should talk with a doctor about physical activity.  Eat heart-healthy foods. Choose foods that are without trans fat and are low in saturated fat, cholesterol, and salt (sodium). This includes fresh or frozen fruits and vegetables, fish, lean meats, fat-free or low-fat dairy foods, whole grains, and high-fiber foods. Lentils and dried peas and beans (legumes) are also good choices.  Limit salt if told by your doctor.  Cook in a healthy way. Roast, grill, broil, bake, poach, steam, or stir-fry foods.  Limit fluids as told by your doctor.  Weigh yourself every morning. Do this after you pee (urinate) and before you eat breakfast. Write down your weight to give to your doctor.  Take your blood pressure and write it down if your doctor tells you to.  Ask your doctor how to check your pulse. Check your pulse as told.  Lose weight if told by your doctor.  Stop smoking or chewing tobacco. Do not use gum or patches that  help you quit without your doctor's approval.  Schedule and go to doctor visits as told.  Nonpregnant women should have no more than 1 drink a day. Men should have no more than 2 drinks a day. Talk to your doctor about drinking alcohol.  Stop illegal drug use.  Stay current with shots (immunizations).  Manage your health conditions as told by your doctor.  Learn to manage your stress.  Rest when you are tired.  If it is really hot outside:  Avoid intense activities.  Use air conditioning or fans, or get in a cooler place.  Avoid caffeine and alcohol.  Wear loose-fitting, lightweight, and light-colored clothing.  If it is really cold outside:  Avoid intense activities.  Layer your clothing.  Wear mittens or gloves, a hat, and a scarf when going outside.  Avoid alcohol.  Learn about heart failure and get support as needed.  Get help to maintain or improve your quality of life and your ability to care for yourself as needed. GET HELP IF:   You gain 03 lb/1.4 kg or more in 1 day or 05 lb/2.3 kg in a week.  You are more short of breath than usual.  You cannot do your normal activities.  You tire easily.  You cough more than normal, especially with activity.  You have any or more puffiness (swelling) in areas such as your hands, feet, ankles, or belly (abdomen).  You cannot sleep because it is hard  to breathe.  You feel like your heart is beating fast (palpitations).  You get dizzy or light-headed when you stand up. GET HELP RIGHT AWAY IF:   You have trouble breathing.  There is a change in mental status, such as becoming less alert or not being able to focus.  You have chest pain or discomfort.  You faint. MAKE SURE YOU:   Understand these instructions.  Will watch your condition.  Will get help right away if you are not doing well or get worse. Document Released: 12/20/2007 Document Revised: 07/27/2013 Document Reviewed: 04/28/2012 [employer]  Patient Information 2015 [place], [place]. This information is not intended to replace advice given to you by your health care provider. Make sure you discuss any questions you have with your health care provider.

## 2014-08-16 NOTE — ED Provider Notes (Signed)
[facility] Emergency Department Provider Note  ____________________________________________  Time seen: Approximately 6:45 PM  I have reviewed the triage vital signs and the nursing notes.   HISTORY  Chief Complaint Shortness of Breath and Fatigue    HPI Derek Barnes is a 64 y.o. male with a history of severe nonischemic cardiomyopathy with an estimated ejection fraction of 5-10% who sees Dr. Devonne Doughty at [facility] for his cardiac care.  His volume has been up recently which manifests primarily with increased shortness of breath with exertion and increased abdominal girth.  He saw Dr. Pernell Dupre several days ago and was given a three-day course of metolazone as a trial.  He takes furosemide 40-80 mg twice a day typically.  Today he was not feeling significantly better and his weight has been essentially unchanged since doing the metolazone.  He called Dr. Pernell Dupre who is out of town currently but who did return his call and advised him that he should go to the nearest emergency department for a dose of IV Lasix.  He will then follow-up with Dr. Pernell Dupre in 3 days in clinic.  Though he is short of breath with exertion, this is chronic for him, especially over the last week.  He denies chest pain, nausea, vomiting, diaphoresis.  He is very clear that he does not want to stay in the hospital and only wants a dose of IV Lasix as recommended by Dr. Pernell Dupre.   Past Medical History  Diagnosis Date  . CHF (congestive heart failure)   . Myocardial infarct   . Nonischemic cardiomyopathy   . Decreased cardiac ejection fraction     EF 5-10%  . Chronic kidney disease (CKD)     There are no active problems to display for this patient.   Past Surgical History  Procedure Laterality Date  . Fracture surgery    . Hernia repair    . Knee arthroscopy    . Shoulder arthroscopy      No current outpatient prescriptions on file.  Allergies Penicillins  History reviewed. No  pertinent family history.  Social History History  Substance Use Topics  . Smoking status: Never Smoker   . Smokeless tobacco: Never Used  . Alcohol Use: No    Review of Systems Constitutional: No fever/chills Eyes: No visual changes. ENT: No sore throat. Cardiovascular: Denies chest pain. Respiratory: Shortness of breath with exertion Gastrointestinal: No abdominal pain.  No nausea, no vomiting.  No diarrhea.  No constipation.  Mild distention Genitourinary: Negative for dysuria. Musculoskeletal: Negative for back pain. Skin: Negative for rash. Neurological: Negative for headaches, focal weakness or numbness.  10-point ROS otherwise negative.  ____________________________________________   PHYSICAL EXAM:  VITAL SIGNS: ED Triage Vitals  Enc Vitals Group     BP 08/16/14 1434 118/64 mmHg     Pulse Rate 08/16/14 1434 75     Resp 08/16/14 1434 18     Temp 08/16/14 1434 98.2 F (36.8 C)     Temp Source 08/16/14 1434 Oral     SpO2 08/16/14 1434 99 %     Weight 08/16/14 1442 201 lb (91.173 kg)     Height 08/16/14 1442  (1.727 m)     Head Cir --      Peak Flow --      Pain Score 08/16/14 1437 0     Pain Loc --      Pain Edu? --      Excl. in GC? --  Constitutional: Alert and oriented. Well appearing and in no acute distress. Eyes: Conjunctivae are normal. PERRL. EOMI. Head: Atraumatic. Nose: No congestion/rhinnorhea. Mouth/Throat: Mucous membranes are moist.  Oropharynx non-erythematous. Neck: No stridor.   Cardiovascular: Normal rate, regular rhythm. Grossly normal heart sounds.  Good peripheral circulation.  Defibrillator present.  No peripheral edema Respiratory: Normal respiratory effort.  No retractions. Lungs CTAB. Gastrointestinal: Soft and nontender.  Mild to moderate distention. No abdominal bruits. No CVA tenderness. Musculoskeletal: No lower extremity tenderness nor edema.  No joint effusions. Neurologic:  Normal speech and language. No gross  focal neurologic deficits are appreciated. Speech is normal. No gait instability. Skin:  Skin is warm, dry and intact. No rash noted. Psychiatric: Mood and affect are normal. Speech and behavior are normal.  ____________________________________________   LABS (all labs ordered are listed, but only abnormal results are displayed)  Labs Reviewed  CBC - Abnormal; Notable for the following:    RBC 4.17 (*)    HCT 38.0 (*)    All other components within normal limits  BASIC METABOLIC PANEL - Abnormal; Notable for the following:    Sodium 132 (*)    Chloride 95 (*)    Glucose, Bld 152 (*)    BUN 26 (*)    Creatinine, Ser 1.61 (*)    Calcium 8.7 (*)    GFR calc non Af Amer 45 (*)    GFR calc Af Amer 52 (*)    All other components within normal limits  BRAIN NATRIURETIC PEPTIDE  TROPONIN I   ____________________________________________  EKG  ED ECG REPORT I, Keyri Salberg, the attending physician, personally viewed and interpreted this ECG.   Date: 08/16/2014  EKG Time: 14:46  Rate: 70  Rhythm: Electronic ventricular pacer  Axis: LAD   Intervals:pacemaker  ST&T Change: non-specific  ____________________________________________  RADIOLOGY  Dg Chest 2 View  08/16/2014   CLINICAL DATA:  Chest tightness and shortness of breath for 4 days history of cardiomyopathy  EXAM: CHEST  2 VIEW  COMPARISON:  05/18/2013  FINDINGS: Mild to moderate cardiac enlargement stable. Cardiac pacer/defibrillator with generator over left thorax unchanged. Vascular pattern normal. No consolidation effusion or edema. No pneumothorax.  IMPRESSION: No active cardiopulmonary disease.   Electronically Signed   By: Esperanza Heir M.D.   On: 08/16/2014 15:09    ____________________________________________   INITIAL IMPRESSION / ASSESSMENT AND PLAN / ED COURSE  Pertinent labs & imaging results that were available during my care of the patient were reviewed by me and considered in my medical decision  making (see chart for details).  The patient is in no acute distress and with essentially normal labs (for him).  He is followed closely by Dr. Pernell Dupre at [facility].  I reviewed the notes in care everywhere which confirm his story.  While the patient was awaiting a room in this emergency department, Dr. Pernell Dupre called him and set up an appointment for follow-up in 3 days.  He again told the patient that he recommends that he get a dose of IV Lasix and then he can go home and take his regular medications.    There is no indication to admit the patient at this time as he has no pulmonary edema and no sign of myocardial injury at this time.  As per his request, I am giving him Lasix 40 mg IV and recommended that he continue taking Lasix as per Dr. Pernell Dupre recommendation.  He will follow-up as scheduled in the clinic.  I gave him  my usual and customary return precautions.  ____________________________________________  FINAL CLINICAL IMPRESSION(S) / ED DIAGNOSES  Final diagnoses:  Hypervolemia, unspecified hypervolemia type  Chronic combined systolic and diastolic congestive heart failure      NEW MEDICATIONS STARTED DURING THIS VISIT:  New Prescriptions   No medications on file     Loleta Rose, MD 08/16/14 1930

## 2014-08-16 NOTE — ED Notes (Signed)
Defibrillator left chest

## 2015-03-13 ENCOUNTER — Emergency Department
Admission: EM | Admit: 2015-03-13 | Discharge: 2015-03-13 | Disposition: A | Discharge status: Home / Self Care | Payer: BC Managed Care – PPO | Attending: Emergency Medicine | Admitting: Emergency Medicine

## 2015-03-13 ENCOUNTER — Emergency Department: Payer: BC Managed Care – PPO

## 2015-03-13 DIAGNOSIS — J069 Acute upper respiratory infection, unspecified: Secondary | ICD-10-CM | POA: Diagnosis not present

## 2015-03-13 DIAGNOSIS — Z79899 Other long term (current) drug therapy: Secondary | ICD-10-CM | POA: Diagnosis not present

## 2015-03-13 DIAGNOSIS — R05 Cough: Secondary | ICD-10-CM | POA: Diagnosis present

## 2015-03-13 DIAGNOSIS — Z880 Allergy status to penicillin: Secondary | ICD-10-CM | POA: Insufficient documentation

## 2015-03-13 MED ORDER — HYDROCOD POLST-CPM POLST ER 10-8 MG/5ML PO SUER: 5.0000 mL | Freq: Two times a day (BID) | ORAL | Status: DC | PRN

## 2015-03-13 NOTE — ED Notes (Signed)
Patient presents to the ED with cough, congestion, and intermittent fever since Wednesday.  Patient reports taking aspirin today but no other cold/cough medication.  Patient is speaking in full sentences without difficulty, ambulatory to registration without obvious difficulty.

## 2015-03-13 NOTE — Discharge Instructions (Signed)
Upper Respiratory Infection, Adult Most upper respiratory infections (URIs) are caused by a virus. A URI affects the nose, throat, and upper air passages. The most common type of URI is often called "the common cold." HOME CARE   Take medicines only as told by your doctor.  Gargle warm saltwater or take cough drops to comfort your throat as told by your doctor.  Use a warm mist humidifier or inhale steam from a shower to increase air moisture. This may make it easier to breathe.  Drink enough fluid to keep your pee (urine) clear or pale yellow.  Eat soups and other clear broths.  Have a healthy diet.  Rest as needed.  Go back to work when your fever is gone or your doctor says it is okay.  You may need to stay home longer to avoid giving your URI to others.  You can also wear a face mask and wash your hands often to prevent spread of the virus.  Use your inhaler more if you have asthma.  Do not use any tobacco products, including cigarettes, chewing tobacco, or electronic cigarettes. If you need help quitting, ask your doctor. GET HELP IF:  You are getting worse, not better.  Your symptoms are not helped by medicine.  You have chills.  You are getting more short of breath.  You have brown or red mucus.  You have yellow or brown discharge from your nose.  You have pain in your face, especially when you bend forward.  You have a fever.  You have puffy (swollen) neck glands.  You have pain while swallowing.  You have white areas in the back of your throat. GET HELP RIGHT AWAY IF:   You have very bad or constant:  Headache.  Ear pain.  Pain in your forehead, behind your eyes, and over your cheekbones (sinus pain).  Chest pain.  You have long-lasting (chronic) lung disease and any of the following:  Wheezing.  Long-lasting cough.  Coughing up blood.  A change in your usual mucus.  You have a stiff neck.  You have changes in  your:  Vision.  Hearing.  Thinking.  Mood. MAKE SURE YOU:   Understand these instructions.  Will watch your condition.  Will get help right away if you are not doing well or get worse.   This information is not intended to replace advice given to you by your health care provider. Make sure you discuss any questions you have with your health care provider.   Document Released: 08/29/2007 Document Revised: 07/27/2014 Document Reviewed: 06/17/2013 Elsevier Interactive Patient Education [employer]   Follow-up with your doctor this week if not improving. Continue taking Tylenol or ibuprofen as needed for fever and aches. Begin taking Tussionex every 12 hours if needed for cough. Increase fluids. Return to the emergency room if any severe worsening of your symptoms or urgent concerns.

## 2015-03-13 NOTE — ED Notes (Signed)
Pt discharged home after verbalizing understanding of discharge instructions; nad noted. 

## 2015-03-13 NOTE — ED Provider Notes (Signed)
[facility] Emergency Department Provider Note  ____________________________________________  Time seen: Approximately 10:12 AM  I have reviewed the triage vital signs and the nursing notes.   HISTORY  Chief Complaint Cough; Nasal Congestion; and Fever   HPI Derek Barnes is a 64 y.o. male is here with complaint of cough and congestion with intermittent fever 4 days. Patient states he has not actually been taken his temperature but felt warm. He has been [place] without any relief of his cough or cold symptoms. Patient denies any history of asthma. Patient does have a history of heart disease and has not take over-the-counter medication. Currently he rates his pain as 2 out of 10.   Past Medical History  Diagnosis Date  . CHF (congestive heart failure)   . Myocardial infarct   . Nonischemic cardiomyopathy (HCC)   . Decreased cardiac ejection fraction     EF 5-10%  . Chronic kidney disease (CKD)     There are no active problems to display for this patient.   Past Surgical History  Procedure Laterality Date  . Fracture surgery    . Hernia repair    . Knee arthroscopy    . Shoulder arthroscopy      Current Outpatient Rx  Name  Route  Sig  Dispense  Refill  . amiodarone (PACERONE) 100 MG tablet   Oral   Take 100 mg by mouth daily.         Marland Kitchen atorvastatin (LIPITOR) 10 MG tablet   Oral   Take 10 mg by mouth daily.         Marland Kitchen EPINEPHrine (ADRENALIN) 0.1 % nasal solution   Nasal   Place 1 drop into the nose once.         . isosorbide dinitrate (ISORDIL) 10 MG tablet   Oral   Take 10 mg by mouth 3 (three) times daily.         . metoprolol succinate (TOPROL-XL) 100 MG 24 hr tablet   Oral   Take 100 mg by mouth daily. Take with or immediately following a meal.         . sacubitril-valsartan (ENTRESTO) 24-26 MG   Oral   Take 1 tablet by mouth 2 (two) times daily.         . chlorpheniramine-HYDROcodone (TUSSIONEX  PENNKINETIC ER) 10-8 MG/5ML SUER   Oral   Take 5 mLs by mouth every 12 (twelve) hours as needed for cough.   140 mL   0     Allergies Penicillins  No family history on file.  Social History Social History  Substance Use Topics  . Smoking status: Never Smoker   . Smokeless tobacco: Never Used  . Alcohol Use: No    Review of Systems Constitutional: Positive fever/chills Eyes: No visual changes. ENT: No sore throat. Positive congestion Cardiovascular: Denies chest pain. Respiratory: Denies shortness of breath. Positive cough Gastrointestinal: No abdominal pain.  No nausea, no vomiting.  No diarrhea.   Genitourinary: Negative for dysuria. Musculoskeletal: Negative for back pain. Skin: Negative for rash. Neurological: Negative for headaches, focal weakness or numbness.  10-point ROS otherwise negative.  ____________________________________________   PHYSICAL EXAM:  VITAL SIGNS: ED Triage Vitals  Enc Vitals Group     BP 03/13/15 0921 144/101 mmHg     Pulse Rate 03/13/15 0921 85     Resp 03/13/15 0921 20     Temp 03/13/15 0921 98.4 F (36.9 C)     Temp Source 03/13/15 0921 Oral  SpO2 03/13/15 0921 97 %     Weight 03/13/15 0921 200 lb (90.719 kg)     Height 03/13/15 0921  (1.727 m)     Head Cir --      Peak Flow --      Pain Score 03/13/15 0922 2     Pain Loc --      Pain Edu? --      Excl. in GC? --     Constitutional: Alert and oriented. Well appearing and in no acute distress. Eyes: Conjunctivae are normal. PERRL. EOMI. Head: Atraumatic. Nose: No congestion/rhinnorhea.   EACs and TMs are clear bilaterally. Mouth/Throat: Mucous membranes are moist.  Oropharynx non-erythematous. Neck: No stridor.   Hematological/Lymphatic/Immunilogical: No cervical lymphadenopathy. Cardiovascular: Normal rate, regular rhythm. Grossly normal heart sounds.  Good peripheral circulation. Respiratory: Normal respiratory effort.  No retractions. Lungs CTAB. Coarse cough  was noted. Gastrointestinal: Soft and nontender. No distention.  Musculoskeletal: Moves upper and lower extremities without any difficulty. Neurologic:  Normal speech and language. No gross focal neurologic deficits are appreciated. No gait instability. Skin:  Skin is warm, dry and intact. No rash noted. Psychiatric: Mood and affect are normal. Speech and behavior are normal.  ____________________________________________   LABS (all labs ordered are listed, but only abnormal results are displayed)  Labs Reviewed - No data to display   RADIOLOGY  Chest x-ray per radiologist shows cardiomegaly with mild pulmonary vascular congestion. ____________________________________________   PROCEDURES  Procedure(s) performed: None  Critical Care performed: No  ____________________________________________   INITIAL IMPRESSION / ASSESSMENT AND PLAN / ED COURSE  Pertinent labs & imaging results that were available during my care of the patient were reviewed by me and considered in my medical decision making (see chart for details).  Patient was given a prescription for Tussionex to be taken every 12 hours as needed for cough. Patient is follow-up with his primary care doctor if any continued problems. Patient is told to return to the emergency room if any severe worsening of his symptoms. ____________________________________________   FINAL CLINICAL IMPRESSION(S) / ED DIAGNOSES  Final diagnoses:  Viral upper respiratory illness      Tommi Rumps, PA-C 03/13/15 1656  Darien Ramus, MD 03/14/15 [phone]

## 2015-03-13 NOTE — ED Notes (Signed)
Pt reports that he has had fever, cough, congestion x 4 days. Pt reports that fever has been intermittent, but that he has not been taking his temperature. Pt alert & oriented with NAD noted.

## 2017-01-22 ENCOUNTER — Encounter: Payer: Self-pay | Admitting: *Deleted

## 2017-01-23 ENCOUNTER — Encounter: Payer: Self-pay | Admitting: *Deleted

## 2017-01-23 ENCOUNTER — Encounter
Admission: RE | Disposition: A | Discharge status: Home / Self Care | Payer: Self-pay | Source: Ambulatory Visit | Attending: Internal Medicine

## 2017-01-23 ENCOUNTER — Ambulatory Visit
Admission: RE | Admit: 2017-01-23 | Discharge: 2017-01-23 | Disposition: A | Discharge status: Home / Self Care | Payer: BC Managed Care – PPO | Source: Ambulatory Visit | Attending: Internal Medicine | Admitting: Internal Medicine

## 2017-01-23 ENCOUNTER — Ambulatory Visit: Payer: BC Managed Care – PPO | Admitting: Anesthesiology

## 2017-01-23 DIAGNOSIS — K6389 Other specified diseases of intestine: Secondary | ICD-10-CM | POA: Diagnosis not present

## 2017-01-23 DIAGNOSIS — K640 First degree hemorrhoids: Secondary | ICD-10-CM | POA: Diagnosis not present

## 2017-01-23 DIAGNOSIS — I252 Old myocardial infarction: Secondary | ICD-10-CM | POA: Insufficient documentation

## 2017-01-23 DIAGNOSIS — I509 Heart failure, unspecified: Secondary | ICD-10-CM | POA: Insufficient documentation

## 2017-01-23 DIAGNOSIS — Z79899 Other long term (current) drug therapy: Secondary | ICD-10-CM | POA: Diagnosis not present

## 2017-01-23 DIAGNOSIS — Z1211 Encounter for screening for malignant neoplasm of colon: Secondary | ICD-10-CM | POA: Insufficient documentation

## 2017-01-23 DIAGNOSIS — I130 Hypertensive heart and chronic kidney disease with heart failure and stage 1 through stage 4 chronic kidney disease, or unspecified chronic kidney disease: Secondary | ICD-10-CM | POA: Insufficient documentation

## 2017-01-23 DIAGNOSIS — N189 Chronic kidney disease, unspecified: Secondary | ICD-10-CM | POA: Diagnosis not present

## 2017-01-23 DIAGNOSIS — K5190 Ulcerative colitis, unspecified, without complications: Secondary | ICD-10-CM | POA: Insufficient documentation

## 2017-01-23 DIAGNOSIS — I429 Cardiomyopathy, unspecified: Secondary | ICD-10-CM | POA: Diagnosis not present

## 2017-01-23 DIAGNOSIS — Z880 Allergy status to penicillin: Secondary | ICD-10-CM | POA: Diagnosis not present

## 2017-01-23 HISTORY — DX: Noninfective gastroenteritis and colitis, unspecified: K52.9

## 2017-01-23 HISTORY — DX: Essential (primary) hypertension: I10

## 2017-01-23 HISTORY — DX: Cardiac arrhythmia, unspecified: I49.9

## 2017-01-23 HISTORY — DX: Abscess of liver: K75.0

## 2017-01-23 HISTORY — PX: COLONOSCOPY WITH PROPOFOL: SHX5780

## 2017-01-23 LAB — GLUCOSE, CAPILLARY: GLUCOSE-CAPILLARY: 115 mg/dL — AB (ref 65–99)

## 2017-01-23 SURGERY — COLONOSCOPY WITH PROPOFOL
Anesthesia: General

## 2017-01-23 MED ORDER — FENTANYL CITRATE (PF) 100 MCG/2ML IJ SOLN
INTRAMUSCULAR | Status: DC | PRN
  Administered 2017-01-23: 50 ug via INTRAVENOUS

## 2017-01-23 MED ORDER — LIDOCAINE HCL (PF) 2 % IJ SOLN
INTRAMUSCULAR | Status: AC
  Filled 2017-01-23: qty 20

## 2017-01-23 MED ORDER — SODIUM CHLORIDE 0.9 % IV SOLN
INTRAVENOUS | Status: DC
  Administered 2017-01-23: 09:00:00 via INTRAVENOUS

## 2017-01-23 MED ORDER — PROPOFOL 10 MG/ML IV BOLUS
INTRAVENOUS | Status: AC
  Filled 2017-01-23: qty 20

## 2017-01-23 MED ORDER — FENTANYL CITRATE (PF) 100 MCG/2ML IJ SOLN
INTRAMUSCULAR | Status: AC
  Filled 2017-01-23: qty 2

## 2017-01-23 MED ORDER — PROPOFOL 500 MG/50ML IV EMUL
INTRAVENOUS | Status: AC
  Filled 2017-01-23: qty 50

## 2017-01-23 MED ORDER — LIDOCAINE 2% (20 MG/ML) 5 ML SYRINGE
INTRAMUSCULAR | Status: DC | PRN
  Administered 2017-01-23: 40 mg via INTRAVENOUS

## 2017-01-23 MED ORDER — PROPOFOL 500 MG/50ML IV EMUL
INTRAVENOUS | Status: DC | PRN
  Administered 2017-01-23: 150 ug/kg/min via INTRAVENOUS

## 2017-01-23 MED ORDER — PROPOFOL 10 MG/ML IV BOLUS
INTRAVENOUS | Status: DC | PRN
  Administered 2017-01-23: 100 mg via INTRAVENOUS

## 2017-01-23 NOTE — Anesthesia Preprocedure Evaluation (Addendum)
Anesthesia Evaluation  Patient identified by MRN, date of birth, ID band Patient awake    Reviewed: Allergy & Precautions, NPO status , Patient's Chart, lab work & pertinent test results  Airway Mallampati: II       Dental  (+) Teeth Intact   Pulmonary neg pulmonary ROS,     + decreased breath sounds      Cardiovascular hypertension, Pt. on medications and Pt. on home beta blockers + Past MI and +CHF  + dysrhythmias III Rhythm:Regular Rate:Normal  EF 5-10%!   Neuro/Psych negative neurological ROS  negative psych ROS   GI/Hepatic negative GI ROS, Neg liver ROS,   Endo/Other    Renal/GU      Musculoskeletal negative musculoskeletal ROS (+)   Abdominal (+) + obese,   Peds  Hematology negative hematology ROS (+)   Anesthesia Other Findings   Reproductive/Obstetrics                            Anesthesia Physical Anesthesia Plan  ASA: III  Anesthesia Plan: General   Post-op Pain Management:    Induction: Intravenous  PONV Risk Score and Plan:   Airway Management Planned: Natural Airway and Nasal Cannula  Additional Equipment:   Intra-op Plan:   Post-operative Plan:   Informed Consent: I have reviewed the patients History and Physical, chart, labs and discussed the procedure including the risks, benefits and alternatives for the proposed anesthesia with the patient or authorized representative who has indicated his/her understanding and acceptance.     Plan Discussed with: CRNA  Anesthesia Plan Comments:         Anesthesia Quick Evaluation

## 2017-01-23 NOTE — Anesthesia Postprocedure Evaluation (Signed)
Anesthesia Post Note  Patient: Derek Barnes  Procedure(s) Performed: COLONOSCOPY WITH PROPOFOL (N/A )  Patient location during evaluation: PACU Anesthesia Type: General Level of consciousness: awake Pain management: pain level controlled Vital Signs Assessment: post-procedure vital signs reviewed and stable Respiratory status: spontaneous breathing Cardiovascular status: stable Anesthetic complications: no     Last Vitals:  Vitals:   01/23/17 0943 01/23/17 0953  BP: 113/64 115/66  Pulse: (!) 55 (!) 50  Resp: 19 14  Temp:    SpO2: 97% 99%    Last Pain:  Vitals:   01/23/17 0806  TempSrc: Tympanic                 VAN STAVEREN,Chizuko Trine

## 2017-01-23 NOTE — Anesthesia Post-op Follow-up Note (Signed)
Anesthesia QCDR form completed.        

## 2017-01-23 NOTE — Interval H&P Note (Signed)
History and Physical Interval Note:  01/23/2017 9:27 AM  Derek Barnes  has presented today for surgery, with the diagnosis of HEMATOCHEZIA COLITIS  The various methods of treatment have been discussed with the patient and family. After consideration of risks, benefits and other options for treatment, the patient has consented to  Procedure(s): COLONOSCOPY WITH PROPOFOL (N/A) as a surgical intervention .  The patient's history has been reviewed, patient examined, no change in status, stable for surgery.  I have reviewed the patient's chart and labs.  Questions were answered to the patient's satisfaction.     [place], Festus

## 2017-01-23 NOTE — Transfer of Care (Signed)
Immediate Anesthesia Transfer of Care Note  Patient: Derek Barnes  Procedure(s) Performed: COLONOSCOPY WITH PROPOFOL (N/A )  Patient Location: PACU and Endoscopy Unit  Anesthesia Type:General  Level of Consciousness: sedated  Airway & Oxygen Therapy: Patient Spontanous Breathing and Patient connected to nasal cannula oxygen  Post-op Assessment: Report given to RN and Post -op Vital signs reviewed and stable  Post vital signs: Reviewed and stable  Last Vitals:  Vitals:   01/23/17 0806  BP: 123/69  Pulse: 64  Resp: 20  Temp: (!) 36.4 C  SpO2: 98%    Last Pain:  Vitals:   01/23/17 0806  TempSrc: Tympanic         Complications: No apparent anesthesia complications

## 2017-01-23 NOTE — H&P (Signed)
Outpatient short stay form Pre-procedure 01/23/2017 9:25 AM Teodoro K. Norma Fredricksonoledo, M.D.  Primary Physician: Clydie Braunavid Fitzgerald, M.D.  Reason for visit:  Hx of Ulcerative colitis, Hematochezia  History of present illness:  Patient with hx of chronic ulcerative colitis, left-sided, presents with flare with intermittent hematochezia. No overt diarrhea or weight loss.     Current Facility-Administered Medications:  .  0.9 %  sodium chloride infusion, , Intravenous, Continuous, Olivetoledo, Boykin Nearingeodoro K, MD, Last Rate: 20 mL/hr at 01/23/17 0830  Prescriptions Prior to Admission  Medication Sig Dispense Refill Last Dose  . amiodarone (PACERONE) 200 MG tablet Take 200 mg by mouth 2 (two) times daily.   01/23/2017 at Unknown time  . atorvastatin (LIPITOR) 40 MG tablet Take 40 mg by mouth daily.   01/23/2017 at Unknown time  . DIGOXIN PO Take by mouth daily.   01/23/2017 at Unknown time  . enalapril (VASOTEC) 10 MG tablet Take 10 mg by mouth daily.   01/23/2017 at Unknown time  . eplerenone (INSPRA) 50 MG tablet Take 50 mg by mouth daily.   01/23/2017 at Unknown time  . isosorbide mononitrate (IMDUR) 30 MG 24 hr tablet Take 30 mg by mouth daily.   01/23/2017 at Unknown time  . metFORMIN (GLUCOPHAGE) 500 MG tablet Take by mouth 2 (two) times daily with a meal.   01/22/2017 at Unknown time  . metoprolol succinate (TOPROL-XL) 50 MG 24 hr tablet Take 50 mg by mouth daily. Take with or immediately following a meal.   01/23/2017 at Unknown time  . sacubitril-valsartan (ENTRESTO) 24-26 MG Take 1 tablet by mouth 2 (two) times daily.   01/23/2017 at Unknown time  . bumetanide (BUMEX) 1 MG tablet Take 1 mg by mouth daily.   Not Taking at Unknown time  . carvedilol (COREG) 12.5 MG tablet Take 12.5 mg by mouth 2 (two) times daily with a meal.   Not Taking at Unknown time  . chlorpheniramine-HYDROcodone (TUSSIONEX PENNKINETIC ER) 10-8 MG/5ML SUER Take 5 mLs by mouth every 12 (twelve) hours as needed for cough. 140 mL 0   .  EPINEPHrine (ADRENALIN) 0.1 % nasal solution Place 1 drop into the nose once.   Not Taking at Unknown time  . furosemide (LASIX) 20 MG tablet Take 20 mg by mouth.   Not Taking at Unknown time  . pantoprazole (PROTONIX) 40 MG tablet Take 40 mg by mouth daily.   Not Taking at Unknown time  . tamsulosin (FLOMAX) 0.4 MG CAPS capsule Take 0.4 mg by mouth daily.   Not Taking at Unknown time     Allergies  Allergen Reactions  . Penicillins Anaphylaxis  . Contrast Media [Iodinated Diagnostic Agents]      Past Medical History:  Diagnosis Date  . CHF (congestive heart failure) (HCC)   . Chronic kidney disease (CKD)   . Colitis   . Decreased cardiac ejection fraction    EF 5-10%  . Dysrhythmia   . Hypertension   . Liver abscess   . Myocardial infarct (HCC)   . Nonischemic cardiomyopathy (HCC)     Review of systems:      Physical Exam  General appearance: alert, cooperative and appears stated age Resp: clear to auscultation bilaterally Cardio: regular rate and rhythm, S1, S2 normal, no murmur, click, rub or gallop GI: soft, non-tender; bowel sounds normal; no masses,  no organomegaly     Planned procedures: Colonoscopy with surveillance biopsies. The patient understands the nature of the planned procedure, indications, risks, alternatives and potential  complications including but not limited to bleeding, infection, perforation, damage to internal organs and possible oversedation/side effects from anesthesia. The patient agrees and gives consent to proceed.  Please refer to procedure notes for findings, recommendations and patient disposition/instructions.    Teodoro K. Norma Fredrickson, M.D. Gastroenterology 01/23/2017  9:25 AM

## 2017-01-23 NOTE — Op Note (Signed)
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 Norma Fredricksonoledo MD, MD Referring MD:         Grier RocherAllen D. Gordy Saversate Jr., MD (Referring MD) Medicines:            Propofol per Anesthesia Complications:        No immediate complications. Procedure:            Pre-Anesthesia Assessment:                       - The risks and benefits of the procedure and the                        sedation options and risks were discussed with the                        patient. All questions were answered and informed                        consent was obtained.                       - The risks and benefits of the procedure and the                        sedation options and risks were discussed with the                        patient. All questions were answered and informed                        consent was obtained.                       - Patient identification and proposed procedure were                        verified prior to the procedure by the nurse. The                        procedure was verified in the procedure room.                       - ASA Grade Assessment: III - A patient with severe                        systemic disease.                       - After reviewing the risks and benefits, the patient  was deemed in satisfactory condition to undergo the                        procedure.                       After obtaining informed consent, the colonoscope was                        passed under direct vision. Throughout the procedure,    the patient's blood pressure, pulse, and oxygen                        saturations were monitored continuously. The                        Colonoscope was introduced through the anus and                        advanced to the the cecum, identified by appendiceal                        orifice and ileocecal valve. The colonoscopy was                        performed without difficulty. The patient tolerated the                        procedure well. The quality of the bowel preparation                        was good. The ileocecal valve, appendiceal orifice, and                        rectum were photographed. Findings:      The perianal and digital rectal examinations were normal. Pertinent       negatives include normal sphincter tone and no palpable rectal lesions.      A segmental area of moderately erythematous mucosa was found in the       sigmoid colon. Two biopsies were taken every 10 cm with a cold       large-capacity forceps from the sigmoid colon for dysplasia surveillance       and ulcerative colitis surveillance. These biopsy specimens from the       sigmoid colon were sent to Pathology.      A [Extent] area of [Severity] friable mucosa [Bleeding] was found [Site].      The exam was otherwise normal throughout the examined colon.      Biopsies were obtained every 10-15cm throughout the otherwise normal       appearing colon and rectum. Bottles labeled "Right colon", "Transverse       colon", "Descending colon", and "Rectum". To all r/o dysplasia.      Non-bleeding internal hemorrhoids were found during retroflexion. The       hemorrhoids were small and Grade I (internal hemorrhoids that do not       prolapse). Impression:           - Erythematous mucosa in the sigmoid colon. Biopsied.                       - Friability [Bleeding].                       -  Non-bleeding internal hemorrhoids. Recommendation:       - Patient has a contact number available for                         emergencies. The signs and symptoms of potential                        delayed complications were discussed with the patient.                        Return to normal activities tomorrow. Written discharge                        instructions were provided to the patient.                       - Low fiber diet for 5 days.                       - No aspirin, ibuprofen, naproxen, or other                        non-steroidal anti-inflammatory drugs indefinitely.                       - Use budesonide 9 mg PO one time per day daily.                       - Return to my office in 2 months.                       - Repeat colonoscopy date to be determined after                        pending pathology results are reviewed for surveillance                        based on pathology results.                       - The findings and recommendations were discussed with                        the patient and their family. Procedure Code(s):    --- Professional ---                       [phone], Colonoscopy, flexible; with biopsy, single or                        multiple Diagnosis Code(s):    --- Professional ---                       K51.50, Left sided colitis without complications                       K63.89, Other specified diseases of intestine                       K64.0, First degree hemorrhoids CPT copyright 2016 American Medical [employer]. All rights reserved. The codes documented in this report are preliminary and  upon coder review may  be revised to meet current compliance requirements. Stanton Kidney MD, MD 01/23/2017 9:16:59 AM This report has been signed electronically. Number of Addenda: 0 Note Initiated On: 01/23/2017 8:42 AM Scope Withdrawal Time: 0 hours 10 minutes 4 seconds  Total Procedure Duration: 0 hours 14 minutes 0 seconds       [facility]

## 2017-01-24 LAB — SURGICAL PATHOLOGY

## 2017-01-25 ENCOUNTER — Encounter: Payer: Self-pay | Admitting: Internal Medicine

## 2017-03-19 ENCOUNTER — Other Ambulatory Visit: Payer: Self-pay

## 2017-03-19 ENCOUNTER — Emergency Department
Admission: EM | Admit: 2017-03-19 | Discharge: 2017-03-19 | Disposition: A | Discharge status: Home / Self Care | Payer: BC Managed Care – PPO | Attending: Emergency Medicine | Admitting: Emergency Medicine

## 2017-03-19 DIAGNOSIS — Z95810 Presence of automatic (implantable) cardiac defibrillator: Secondary | ICD-10-CM | POA: Insufficient documentation

## 2017-03-19 DIAGNOSIS — R339 Retention of urine, unspecified: Secondary | ICD-10-CM | POA: Insufficient documentation

## 2017-03-19 DIAGNOSIS — N189 Chronic kidney disease, unspecified: Secondary | ICD-10-CM | POA: Insufficient documentation

## 2017-03-19 DIAGNOSIS — I509 Heart failure, unspecified: Secondary | ICD-10-CM | POA: Insufficient documentation

## 2017-03-19 DIAGNOSIS — I130 Hypertensive heart and chronic kidney disease with heart failure and stage 1 through stage 4 chronic kidney disease, or unspecified chronic kidney disease: Secondary | ICD-10-CM | POA: Diagnosis not present

## 2017-03-19 DIAGNOSIS — Z79899 Other long term (current) drug therapy: Secondary | ICD-10-CM | POA: Insufficient documentation

## 2017-03-19 DIAGNOSIS — R1030 Lower abdominal pain, unspecified: Secondary | ICD-10-CM | POA: Diagnosis present

## 2017-03-19 LAB — COMPREHENSIVE METABOLIC PANEL
ALT: 14 U/L — AB (ref 17–63)
ANION GAP: 7 (ref 5–15)
AST: 16 U/L (ref 15–41)
Albumin: 3.7 g/dL (ref 3.5–5.0)
Alkaline Phosphatase: 60 U/L (ref 38–126)
BUN: 21 mg/dL — ABNORMAL HIGH (ref 6–20)
CHLORIDE: 104 mmol/L (ref 101–111)
CO2: 22 mmol/L (ref 22–32)
CREATININE: 1.14 mg/dL (ref 0.61–1.24)
Calcium: 8.8 mg/dL — ABNORMAL LOW (ref 8.9–10.3)
GFR calc non Af Amer: >60 mL/min (ref >60)
Glucose, Bld: 169 mg/dL — ABNORMAL HIGH (ref 65–99)
POTASSIUM: 3.9 mmol/L (ref 3.5–5.1)
SODIUM: 133 mmol/L — AB (ref 135–145)
Total Bilirubin: 2.2 mg/dL — ABNORMAL HIGH (ref 0.3–1.2)
Total Protein: 7 g/dL (ref 6.5–8.1)

## 2017-03-19 LAB — URINALYSIS, COMPLETE (UACMP) WITH MICROSCOPIC
BACTERIA UA: NONE SEEN
BILIRUBIN URINE: NEGATIVE
Glucose, UA: 150 mg/dL — AB
KETONES UR: 5 mg/dL — AB
LEUKOCYTES UA: NEGATIVE
Nitrite: NEGATIVE
PROTEIN: 30 mg/dL — AB
SQUAMOUS EPITHELIAL / LPF: NONE SEEN
Specific Gravity, Urine: 1.021 (ref 1.005–1.030)
pH: 6 (ref 5.0–8.0)

## 2017-03-19 LAB — CBC
HEMATOCRIT: 39.8 % — AB (ref 40.0–52.0)
HEMOGLOBIN: 13.4 g/dL (ref 13.0–18.0)
MCH: 30.2 pg (ref 26.0–34.0)
MCHC: 33.8 g/dL (ref 32.0–36.0)
MCV: 89.2 fL (ref 80.0–100.0)
Platelets: 166 10*3/uL (ref 150–440)
RBC: 4.46 MIL/uL (ref 4.40–5.90)
RDW: 16.1 % — ABNORMAL HIGH (ref 11.5–14.5)
WBC: 8.4 10*3/uL (ref 3.8–10.6)

## 2017-03-19 LAB — LIPASE, BLOOD: LIPASE: 26 U/L (ref 11–51)

## 2017-03-19 MED ORDER — TAMSULOSIN HCL 0.4 MG PO CAPS: 0.4000 mg | ORAL_CAPSULE | Freq: Every day | ORAL | 0 refills | Status: AC

## 2017-03-19 MED ORDER — TAMSULOSIN HCL 0.4 MG PO CAPS
0.4000 mg | ORAL_CAPSULE | Freq: Once | ORAL | Status: AC
  Administered 2017-03-19: 0.4 mg via ORAL
  Filled 2017-03-19: qty 1

## 2017-03-19 NOTE — ED Provider Notes (Signed)
[facility] Emergency Department Provider Note  ____________________________________________   First MD Initiated Contact with Patient 03/19/17 [phone]     (approximate)  I have reviewed the triage vital signs and the nursing notes.   HISTORY  Chief Complaint Abdominal Pain   HPI Derek Barnes is a 66 y.o. male history of CHF as well as chronic kidney disease who is presenting to the emergency department today with lower abdominal pain.  He says that he is only been able to urinate and "dribbles" over the past 2 days.  He began having lower abdominal fullness and reported to the emergency department.  He has not reporting any nausea, vomiting or diarrhea.  Foley placed prior to my arrival and the patient has put all 100 cc.  He says that his symptoms are completely resolved at this time.  Says that up until the past 2 days that he has not had any difficulties with urination.  Says that he has not waking up at night or pain only a small amount each time he voids.  Patient says that the only new medication he has is a diuretic.  Denies take any Benadryl or allergy medications.  Past Medical History:  Diagnosis Date  . CHF (congestive heart failure) (HCC)   . Chronic kidney disease (CKD)   . Colitis   . Decreased cardiac ejection fraction    EF 5-10%  . Dysrhythmia   . Hypertension   . Liver abscess   . Myocardial infarct (HCC)   . Nonischemic cardiomyopathy (HCC)     There are no active problems to display for this patient.   Past Surgical History:  Procedure Laterality Date  . ACD DEFIBRILLATOR    . COLONOSCOPY    . COLONOSCOPY WITH PROPOFOL N/A 01/23/2017   Procedure: COLONOSCOPY WITH PROPOFOL;  Surgeon: Toledo, Boykin Nearing, MD;  Location: [facility] ENDOSCOPY;  Service: Gastroenterology;  Laterality: N/A;  . FRACTURE SURGERY    . HERNIA REPAIR    . INSERT / REPLACE / REMOVE PACEMAKER    . KNEE ARTHROSCOPY    . SHOULDER ARTHROSCOPY      Prior to  Admission medications   Medication Sig Start Date End Date Taking? Authorizing Provider  amiodarone (PACERONE) 200 MG tablet Take 200 mg by mouth 2 (two) times daily.    [provider]  atorvastatin (LIPITOR) 40 MG tablet Take 40 mg by mouth daily.    [provider]  bumetanide (BUMEX) 1 MG tablet Take 1 mg by mouth daily.    [provider]  carvedilol (COREG) 12.5 MG tablet Take 12.5 mg by mouth 2 (two) times daily with a meal.    [provider]  chlorpheniramine-HYDROcodone (TUSSIONEX PENNKINETIC ER) 10-8 MG/5ML SUER Take 5 mLs by mouth every 12 (twelve) hours as needed for cough. 03/13/15   Tommi Rumps, PA-C  DIGOXIN PO Take by mouth daily.    [provider]  enalapril (VASOTEC) 10 MG tablet Take 10 mg by mouth daily.    [provider]  EPINEPHrine (ADRENALIN) 0.1 % nasal solution Place 1 drop into the nose once.    [provider]  eplerenone (INSPRA) 50 MG tablet Take 50 mg by mouth daily.    [provider]  furosemide (LASIX) 20 MG tablet Take 20 mg by mouth.    [provider]  isosorbide mononitrate (IMDUR) 30 MG 24 hr tablet Take 30 mg by mouth daily.    [provider]  metFORMIN (  GLUCOPHAGE) 500 MG tablet Take by mouth 2 (two) times daily with a meal.    [provider]  metoprolol succinate (TOPROL-XL) 50 MG 24 hr tablet Take 50 mg by mouth daily. Take with or immediately following a meal.    [provider]  pantoprazole (PROTONIX) 40 MG tablet Take 40 mg by mouth daily.    [provider]  sacubitril-valsartan (ENTRESTO) 24-26 MG Take 1 tablet by mouth 2 (two) times daily.    [provider]  tamsulosin (FLOMAX) 0.4 MG CAPS capsule Take 0.4 mg by mouth daily.    [provider]    Allergies Penicillins and Contrast media [iodinated diagnostic agents]  No family history on file.  Social History Social History   Tobacco Use  .  Smoking status: Never Smoker  . Smokeless tobacco: Never Used  Substance Use Topics  . Alcohol use: No  . Drug use: No    Review of Systems  Constitutional: No fever/chills Eyes: No visual changes. ENT: No sore throat. Cardiovascular: Denies chest pain. Respiratory: Denies shortness of breath. Gastrointestinal:  No nausea, no vomiting.  No diarrhea.  No constipation. Genitourinary: As above  musculoskeletal: Negative for back pain. Skin: Negative for rash. Neurological: Negative for headaches, focal weakness or numbness.   ____________________________________________   PHYSICAL EXAM:  VITAL SIGNS: ED Triage Vitals  Enc Vitals Group     BP 03/19/17 0545 91/71     Pulse Rate 03/19/17 0545 86     Resp 03/19/17 0545 20     Temp 03/19/17 0545 98.1 F (36.7 C)     Temp Source 03/19/17 0545 Oral     SpO2 03/19/17 0545 97 %     Weight 03/19/17 0543 200 lb (90.7 kg)     Height 03/19/17 0543 5\' 8"  (1.727 m)     Head Circumference --      Peak Flow --      Pain Score 03/19/17 0543 7     Pain Loc --      Pain Edu? --      Excl. in GC? --     Constitutional: Alert and oriented. Well appearing and in no acute distress. Eyes: Conjunctivae are normal.  Head: Atraumatic. Nose: No congestion/rhinnorhea. Mouth/Throat: Mucous membranes are moist.  Neck: No stridor.   Cardiovascular: Normal rate, regular rhythm. Grossly normal heart sounds.   Respiratory: Normal respiratory effort.  No retractions. Lungs CTAB. Gastrointestinal: Soft and nontender. No distention. No CVA tenderness.  Foley bag with yellow urine at the bedside. Musculoskeletal: No lower extremity tenderness nor edema.  No joint effusions. Neurologic:  Normal speech and language. No gross focal neurologic deficits are appreciated. Skin:  Skin is warm, dry and intact. No rash noted. Psychiatric: Mood and affect are normal. Speech and behavior are normal.  ____________________________________________   LABS (all  labs ordered are listed, but only abnormal results are displayed)  Labs Reviewed  COMPREHENSIVE METABOLIC PANEL - Abnormal; Notable for the following components:      Result Value   Sodium 133 (*)    Glucose, Bld 169 (*)    BUN 21 (*)    Calcium 8.8 (*)    ALT 14 (*)    Total Bilirubin 2.2 (*)    All other components within normal limits  CBC - Abnormal; Notable for the following components:   HCT 39.8 (*)    RDW 16.1 (*)    All other components within normal limits  URINALYSIS, COMPLETE (UACMP) WITH MICROSCOPIC -  Abnormal; Notable for the following components:   Color, Urine YELLOW (*)    APPearance CLEAR (*)    Glucose, UA 150 (*)    Hgb urine dipstick SMALL (*)    Ketones, ur 5 (*)    Protein, ur 30 (*)    All other components within normal limits  LIPASE, BLOOD   ____________________________________________  EKG   ____________________________________________  RADIOLOGY   ____________________________________________   PROCEDURES  Procedure(s) performed:   Procedures  Critical Care performed:   ____________________________________________   INITIAL IMPRESSION / ASSESSMENT AND PLAN / ED COURSE  Pertinent labs & imaging results that were available during my care of the patient were reviewed by me and considered in my medical decision making (see chart for details).  Differential diagnosis includes, but is not limited to, acute appendicitis, renal colic, testicular torsion, urinary tract infection/pyelonephritis, prostatitis,  epididymitis, diverticulitis, small bowel obstruction or ileus, colitis, abdominal aortic aneurysm, gastroenteritis, hernia, etc. As part of my medical decision making, I reviewed the following data within the electronic MEDICAL RECORD NUMBER Notes from prior ED visits   Patient with urinary retention of unknown cause.  No signs of UTI.  Reassuring labs.  Patient will be discharged home with Flomax and will follow up with urology.  He is  understanding of the plan willing to comply.     ____________________________________________   FINAL CLINICAL IMPRESSION(S) / ED DIAGNOSES  Urinary retention.    NEW MEDICATIONS STARTED DURING THIS VISIT:  This SmartLink is deprecated. Use AVSMEDLIST instead to display the medication list for a patient.   Note:  This document was prepared using Dragon voice recognition software and may include unintentional dictation errors.     Myrna Blazer, MD 03/19/17 [phone]

## 2017-03-19 NOTE — ED Notes (Signed)
Patient states he voided an hour ago but he feels like he still needs to go, this RN to bladder scan patient.  Denies any hematuria.

## 2017-03-19 NOTE — ED Notes (Signed)
ED Provider at bedside. 

## 2017-03-19 NOTE — ED Triage Notes (Signed)
Pt arrives to ED via POV from home with c/o abdominal pain x2 days. Pt reports h/x of colitis; states he is having difficulty having a BM or urinate.

## 2017-03-19 NOTE — ED Notes (Signed)
Leg bag applied per MD order.

## 2017-03-20 LAB — URINE CULTURE: Culture: NO GROWTH

## 2017-03-29 ENCOUNTER — Emergency Department
Admission: EM | Admit: 2017-03-29 | Discharge: 2017-03-29 | Discharge status: Against Medical Advice | Payer: BC Managed Care – PPO | Source: Home / Self Care | Attending: Emergency Medicine | Admitting: Emergency Medicine

## 2017-03-29 ENCOUNTER — Encounter: Payer: Self-pay | Admitting: Emergency Medicine

## 2017-03-29 ENCOUNTER — Inpatient Hospital Stay
Admission: EM | Admit: 2017-03-29 | Discharge: 2017-03-31 | DRG: 309 | Disposition: A | Discharge status: Home / Self Care | Payer: BC Managed Care – PPO | Attending: Internal Medicine | Admitting: Internal Medicine

## 2017-03-29 ENCOUNTER — Ambulatory Visit: Payer: Self-pay | Admitting: Urology

## 2017-03-29 ENCOUNTER — Emergency Department: Payer: BC Managed Care – PPO

## 2017-03-29 ENCOUNTER — Other Ambulatory Visit: Payer: Self-pay

## 2017-03-29 ENCOUNTER — Ambulatory Visit: Payer: BC Managed Care – PPO | Admitting: Urology

## 2017-03-29 ENCOUNTER — Observation Stay: Payer: BC Managed Care – PPO

## 2017-03-29 VITALS — BP 128/70 | HR 108 | Ht 68.0 in | Wt 194.2 lb

## 2017-03-29 DIAGNOSIS — Z888 Allergy status to other drugs, medicaments and biological substances status: Secondary | ICD-10-CM

## 2017-03-29 DIAGNOSIS — R001 Bradycardia, unspecified: Secondary | ICD-10-CM | POA: Diagnosis not present

## 2017-03-29 DIAGNOSIS — Z91041 Radiographic dye allergy status: Secondary | ICD-10-CM

## 2017-03-29 DIAGNOSIS — I255 Ischemic cardiomyopathy: Secondary | ICD-10-CM | POA: Diagnosis present

## 2017-03-29 DIAGNOSIS — Z95810 Presence of automatic (implantable) cardiac defibrillator: Secondary | ICD-10-CM

## 2017-03-29 DIAGNOSIS — R778 Other specified abnormalities of plasma proteins: Secondary | ICD-10-CM

## 2017-03-29 DIAGNOSIS — Z79899 Other long term (current) drug therapy: Secondary | ICD-10-CM

## 2017-03-29 DIAGNOSIS — N401 Enlarged prostate with lower urinary tract symptoms: Secondary | ICD-10-CM | POA: Diagnosis present

## 2017-03-29 DIAGNOSIS — Z880 Allergy status to penicillin: Secondary | ICD-10-CM

## 2017-03-29 DIAGNOSIS — I252 Old myocardial infarction: Secondary | ICD-10-CM

## 2017-03-29 DIAGNOSIS — I509 Heart failure, unspecified: Secondary | ICD-10-CM | POA: Insufficient documentation

## 2017-03-29 DIAGNOSIS — K922 Gastrointestinal hemorrhage, unspecified: Secondary | ICD-10-CM | POA: Insufficient documentation

## 2017-03-29 DIAGNOSIS — I5042 Chronic combined systolic (congestive) and diastolic (congestive) heart failure: Secondary | ICD-10-CM | POA: Diagnosis present

## 2017-03-29 DIAGNOSIS — E785 Hyperlipidemia, unspecified: Secondary | ICD-10-CM | POA: Diagnosis present

## 2017-03-29 DIAGNOSIS — I959 Hypotension, unspecified: Secondary | ICD-10-CM | POA: Diagnosis not present

## 2017-03-29 DIAGNOSIS — Z7983 Long term (current) use of bisphosphonates: Secondary | ICD-10-CM

## 2017-03-29 DIAGNOSIS — N189 Chronic kidney disease, unspecified: Secondary | ICD-10-CM | POA: Insufficient documentation

## 2017-03-29 DIAGNOSIS — K51911 Ulcerative colitis, unspecified with rectal bleeding: Secondary | ICD-10-CM | POA: Diagnosis present

## 2017-03-29 DIAGNOSIS — I480 Paroxysmal atrial fibrillation: Secondary | ICD-10-CM | POA: Diagnosis present

## 2017-03-29 DIAGNOSIS — R39198 Other difficulties with micturition: Secondary | ICD-10-CM

## 2017-03-29 DIAGNOSIS — D62 Acute posthemorrhagic anemia: Secondary | ICD-10-CM | POA: Diagnosis present

## 2017-03-29 DIAGNOSIS — I472 Ventricular tachycardia, unspecified: Secondary | ICD-10-CM

## 2017-03-29 DIAGNOSIS — Z7982 Long term (current) use of aspirin: Secondary | ICD-10-CM

## 2017-03-29 DIAGNOSIS — Z8674 Personal history of sudden cardiac arrest: Secondary | ICD-10-CM

## 2017-03-29 DIAGNOSIS — I130 Hypertensive heart and chronic kidney disease with heart failure and stage 1 through stage 4 chronic kidney disease, or unspecified chronic kidney disease: Secondary | ICD-10-CM | POA: Insufficient documentation

## 2017-03-29 DIAGNOSIS — R7989 Other specified abnormal findings of blood chemistry: Secondary | ICD-10-CM

## 2017-03-29 DIAGNOSIS — R079 Chest pain, unspecified: Secondary | ICD-10-CM

## 2017-03-29 DIAGNOSIS — I110 Hypertensive heart disease with heart failure: Secondary | ICD-10-CM | POA: Diagnosis present

## 2017-03-29 DIAGNOSIS — R338 Other retention of urine: Secondary | ICD-10-CM | POA: Diagnosis present

## 2017-03-29 DIAGNOSIS — Z7984 Long term (current) use of oral hypoglycemic drugs: Secondary | ICD-10-CM

## 2017-03-29 DIAGNOSIS — E119 Type 2 diabetes mellitus without complications: Secondary | ICD-10-CM | POA: Diagnosis present

## 2017-03-29 DIAGNOSIS — K625 Hemorrhage of anus and rectum: Secondary | ICD-10-CM | POA: Diagnosis present

## 2017-03-29 DIAGNOSIS — I499 Cardiac arrhythmia, unspecified: Secondary | ICD-10-CM

## 2017-03-29 LAB — BASIC METABOLIC PANEL
Anion gap: 11 (ref 5–15)
BUN: 18 mg/dL (ref 6–20)
CALCIUM: 8.4 mg/dL — AB (ref 8.9–10.3)
CO2: 24 mmol/L (ref 22–32)
CREATININE: 1.18 mg/dL (ref 0.61–1.24)
Chloride: 102 mmol/L (ref 101–111)
GFR calc non Af Amer: >60 mL/min (ref >60)
GLUCOSE: 191 mg/dL — AB (ref 65–99)
Potassium: 3.8 mmol/L (ref 3.5–5.1)
Sodium: 137 mmol/L (ref 135–145)

## 2017-03-29 LAB — CBC WITH DIFFERENTIAL/PLATELET
Basophils Absolute: 0 10*3/uL (ref 0–0.1)
Basophils Relative: 0 %
Eosinophils Absolute: 0 10*3/uL (ref 0–0.7)
Eosinophils Relative: 1 %
HEMATOCRIT: 36.6 % — AB (ref 40.0–52.0)
Hemoglobin: 12.5 g/dL — ABNORMAL LOW (ref 13.0–18.0)
LYMPHS ABS: 0.5 10*3/uL — AB (ref 1.0–3.6)
Lymphocytes Relative: 6 %
MCH: 30.4 pg (ref 26.0–34.0)
MCHC: 34.2 g/dL (ref 32.0–36.0)
MCV: 88.8 fL (ref 80.0–100.0)
MONO ABS: 0.7 10*3/uL (ref 0.2–1.0)
MONOS PCT: 8 %
NEUTROS ABS: 7.8 10*3/uL — AB (ref 1.4–6.5)
Neutrophils Relative %: 85 %
Platelets: 178 10*3/uL (ref 150–440)
RBC: 4.13 MIL/uL — ABNORMAL LOW (ref 4.40–5.90)
RDW: 15.9 % — AB (ref 11.5–14.5)
WBC: 9.2 10*3/uL (ref 3.8–10.6)

## 2017-03-29 LAB — CBC
HCT: 33.5 % — ABNORMAL LOW (ref 40.0–52.0)
Hemoglobin: 11.3 g/dL — ABNORMAL LOW (ref 13.0–18.0)
MCH: 30 pg (ref 26.0–34.0)
MCHC: 33.6 g/dL (ref 32.0–36.0)
MCV: 89.3 fL (ref 80.0–100.0)
PLATELETS: 168 10*3/uL (ref 150–440)
RBC: 3.75 MIL/uL — ABNORMAL LOW (ref 4.40–5.90)
RDW: 16.1 % — AB (ref 11.5–14.5)
WBC: 10.1 10*3/uL (ref 3.8–10.6)

## 2017-03-29 LAB — HEMOGLOBIN: HEMOGLOBIN: 11.6 g/dL — AB (ref 13.0–18.0)

## 2017-03-29 LAB — TROPONIN I
TROPONIN I: 0.06 ng/mL — AB (ref <0.03)
Troponin I: 0.06 ng/mL (ref <0.03)
Troponin I: 0.07 ng/mL (ref <0.03)
Troponin I: 0.07 ng/mL (ref <0.03)

## 2017-03-29 LAB — TYPE AND SCREEN
ABO/RH(D): O POS
Antibody Screen: NEGATIVE

## 2017-03-29 LAB — GLUCOSE, CAPILLARY: GLUCOSE-CAPILLARY: 283 mg/dL — AB (ref 65–99)

## 2017-03-29 LAB — MAGNESIUM: MAGNESIUM: 1.5 mg/dL — AB (ref 1.7–2.4)

## 2017-03-29 LAB — POTASSIUM: POTASSIUM: 3.6 mmol/L (ref 3.5–5.1)

## 2017-03-29 LAB — TSH: TSH: 1.345 u[IU]/mL (ref 0.350–4.500)

## 2017-03-29 MED ORDER — MAGNESIUM SULFATE 4 GM/100ML IV SOLN
4.0000 g | Freq: Once | INTRAVENOUS | Status: AC
  Administered 2017-03-29: 4 g via INTRAVENOUS
  Filled 2017-03-29: qty 100

## 2017-03-29 MED ORDER — ATORVASTATIN CALCIUM 20 MG PO TABS
40.0000 mg | ORAL_TABLET | Freq: Every day | ORAL | Status: DC
  Administered 2017-03-29 – 2017-03-31 (×3): 40 mg via ORAL
  Filled 2017-03-29 (×3): qty 2

## 2017-03-29 MED ORDER — AMIODARONE HCL 200 MG PO TABS
200.0000 mg | ORAL_TABLET | Freq: Two times a day (BID) | ORAL | Status: DC
  Administered 2017-03-29 (×2): 200 mg via ORAL
  Filled 2017-03-29 (×2): qty 1

## 2017-03-29 MED ORDER — BUDESONIDE 3 MG PO CPEP
9.0000 mg | ORAL_CAPSULE | Freq: Every day | ORAL | Status: DC
  Administered 2017-03-29 – 2017-03-30 (×2): 9 mg via ORAL
  Filled 2017-03-29 (×2): qty 3

## 2017-03-29 MED ORDER — NITROGLYCERIN 0.4 MG SL SUBL
0.4000 mg | SUBLINGUAL_TABLET | SUBLINGUAL | Status: DC | PRN
  Administered 2017-03-29: 0.4 mg via SUBLINGUAL
  Filled 2017-03-29 (×2): qty 1

## 2017-03-29 MED ORDER — TAMSULOSIN HCL 0.4 MG PO CAPS
0.4000 mg | ORAL_CAPSULE | Freq: Every day | ORAL | Status: DC
Start: 2017-03-29 — End: 2017-03-31
  Administered 2017-03-29 – 2017-03-31 (×3): 0.4 mg via ORAL
  Filled 2017-03-29 (×4): qty 1

## 2017-03-29 MED ORDER — PREDNISONE 20 MG PO TABS: 40.0000 mg | ORAL_TABLET | Freq: Every day | ORAL | 0 refills | Status: DC

## 2017-03-29 MED ORDER — AMIODARONE IV BOLUS ONLY 150 MG/100ML
150.0000 mg | Freq: Once | INTRAVENOUS | Status: DC
  Filled 2017-03-29: qty 100

## 2017-03-29 MED ORDER — ISOSORBIDE MONONITRATE ER 30 MG PO TB24
30.0000 mg | ORAL_TABLET | Freq: Every day | ORAL | Status: DC
Start: 2017-03-29 — End: 2017-03-31
  Administered 2017-03-29 – 2017-03-31 (×2): 30 mg via ORAL
  Filled 2017-03-29 (×2): qty 1

## 2017-03-29 MED ORDER — SACUBITRIL-VALSARTAN 24-26 MG PO TABS
1.0000 | ORAL_TABLET | Freq: Two times a day (BID) | ORAL | Status: DC
  Administered 2017-03-29 – 2017-03-31 (×3): 1 via ORAL
  Filled 2017-03-29 (×7): qty 1

## 2017-03-29 MED ORDER — DIGOXIN 125 MCG PO TABS
125.0000 ug | ORAL_TABLET | Freq: Every day | ORAL | Status: DC
  Administered 2017-03-29 – 2017-03-31 (×3): 125 ug via ORAL
  Filled 2017-03-29 (×3): qty 1

## 2017-03-29 MED ORDER — POTASSIUM CHLORIDE CRYS ER 20 MEQ PO TBCR
40.0000 meq | EXTENDED_RELEASE_TABLET | Freq: Once | ORAL | Status: AC
  Administered 2017-03-29: 40 meq via ORAL
  Filled 2017-03-29: qty 2

## 2017-03-29 MED ORDER — METOPROLOL SUCCINATE ER 50 MG PO TB24
50.0000 mg | ORAL_TABLET | Freq: Every day | ORAL | Status: DC
  Administered 2017-03-29 – 2017-03-31 (×2): 50 mg via ORAL
  Filled 2017-03-29 (×2): qty 1

## 2017-03-29 MED ORDER — METOPROLOL TARTRATE 5 MG/5ML IV SOLN: 2.5000 mg | INTRAVENOUS | Status: DC | PRN

## 2017-03-29 MED ORDER — AMIODARONE HCL 200 MG PO TABS
200.0000 mg | ORAL_TABLET | Freq: Two times a day (BID) | ORAL | Status: DC
  Administered 2017-03-30 – 2017-03-31 (×3): 200 mg via ORAL
  Filled 2017-03-29 (×3): qty 1

## 2017-03-29 MED ORDER — METHYLPREDNISOLONE SODIUM SUCC 125 MG IJ SOLR
125.0000 mg | Freq: Once | INTRAMUSCULAR | Status: AC
  Administered 2017-03-29: 125 mg via INTRAVENOUS
  Filled 2017-03-29: qty 2

## 2017-03-29 MED ORDER — ACETAMINOPHEN 650 MG RE SUPP: 650.0000 mg | Freq: Four times a day (QID) | RECTAL | Status: DC | PRN

## 2017-03-29 MED ORDER — MESALAMINE 1.2 G PO TBEC
2.4000 g | DELAYED_RELEASE_TABLET | Freq: Every day | ORAL | Status: DC
  Administered 2017-03-30 – 2017-03-31 (×2): 2.4 g via ORAL
  Filled 2017-03-29 (×2): qty 2

## 2017-03-29 MED ORDER — AMIODARONE IV BOLUS ONLY 150 MG/100ML: 150.0000 mg | Freq: Once | INTRAVENOUS | Status: DC

## 2017-03-29 MED ORDER — ACETAMINOPHEN 325 MG PO TABS
650.0000 mg | ORAL_TABLET | Freq: Four times a day (QID) | ORAL | Status: DC | PRN
Start: 2017-03-29 — End: 2017-03-31

## 2017-03-29 MED ORDER — BUMETANIDE 1 MG PO TABS
1.0000 mg | ORAL_TABLET | Freq: Every day | ORAL | Status: DC
  Administered 2017-03-29 – 2017-03-31 (×2): 1 mg via ORAL
  Filled 2017-03-29 (×3): qty 1

## 2017-03-29 MED ORDER — ONDANSETRON HCL 4 MG PO TABS: 4.0000 mg | ORAL_TABLET | Freq: Four times a day (QID) | ORAL | Status: DC | PRN

## 2017-03-29 MED ORDER — PREDNISONE 20 MG PO TABS: 20.0000 mg | ORAL_TABLET | Freq: Every day | ORAL | Status: DC

## 2017-03-29 MED ORDER — METFORMIN HCL 500 MG PO TABS
500.0000 mg | ORAL_TABLET | Freq: Two times a day (BID) | ORAL | Status: DC
  Administered 2017-03-29 – 2017-03-31 (×4): 500 mg via ORAL
  Filled 2017-03-29 (×5): qty 1

## 2017-03-29 MED ORDER — ONDANSETRON HCL 4 MG/2ML IJ SOLN: 4.0000 mg | Freq: Four times a day (QID) | INTRAMUSCULAR | Status: DC | PRN

## 2017-03-29 NOTE — ED Notes (Signed)
Pt verbalizes understanding of leaving AMA , pt signed paper  Copy of AMA form due to topax malfnx.

## 2017-03-29 NOTE — Consult Note (Signed)
PULMONARY / CRITICAL CARE MEDICINE   Name: Derek Barnes MRN: [id number] DOB: 1951-03-17    ADMISSION DATE:  03/29/2017   CONSULTATION DATE: February 26, 2018  REFERRING MD: Dr. Cherlynn Kaiser  Reason: Cardiac arrhythmia  HISTORY OF PRESENT ILLNESS:   This is a 67 year old Caucasian male with a past medical history as indicated below who was admitted with rectal bleeding secondary to acute ulcerative colitis, blood loss anemia, urinary retention, chest pain and and syncopal episodes.  He was seen by cardiology, urology and gastroenterology.  This evening he went into VT with a pulse and also became hypotensive.  He was given magnesium and p.o. amiodarone with conversion back to sinus rhythm at a rate of 82 bpm.  He is currently awake and offers no complaints.  Patient has a history of ischemic cardiomyopathy with an AICD.  He reports having episodes of palpitations shortness of breath and chest discomfort prior to admission.  He has a cardiologist that he follows with regularly.  His last EF was 10-15%.  PAST MEDICAL HISTORY :  He  has a past medical history of CHF (congestive heart failure) (HCC), Chronic kidney disease (CKD), Colitis, Decreased cardiac ejection fraction, Dysrhythmia, Hypertension, Liver abscess, Myocardial infarct (HCC), and Nonischemic cardiomyopathy (HCC).  PAST SURGICAL HISTORY: He  has a past surgical history that includes Fracture surgery; Hernia repair; Knee arthroscopy; Shoulder arthroscopy; Colonoscopy; Insert / replace / remove pacemaker; ACD DEFIBRILLATOR; and Colonoscopy with propofol (N/A, 01/23/2017).  Allergies  Allergen Reactions  . Contrast Media [Iodinated Diagnostic Agents] Anaphylaxis  . Penicillins Anaphylaxis    Has patient had a PCN reaction causing immediate rash, facial/tongue/throat swelling, SOB or lightheadedness with hypotension: Yes Has patient had a PCN reaction causing severe rash involving mucus membranes or skin necrosis: Yes Has patient had a  PCN reaction that required hospitalization: No Has patient had a PCN reaction occurring within the last 10 years: No If all of the above answers are "NO", then may proceed with Cephalosporin use.   . Sotalol Other (See Comments)    Excessive fatigue    No current facility-administered medications on file prior to encounter.    Current Outpatient Medications on File Prior to Encounter  Medication Sig  . amiodarone (PACERONE) 200 MG tablet Take 200 mg by mouth 2 (two) times daily.  Marland Kitchen aspirin EC 81 MG tablet Take 81 mg by mouth daily.  Marland Kitchen atorvastatin (LIPITOR) 40 MG tablet Take 40 mg by mouth daily.  . Budesonide ER 9 MG TB24 Take 1 tablet by mouth daily.  . bumetanide (BUMEX) 1 MG tablet Take 1 mg by mouth daily.  . digoxin (LANOXIN) 0.125 MG tablet Take 1 tablet by mouth daily.   Marland Kitchen eplerenone (INSPRA) 50 MG tablet Take 50 mg by mouth daily.  . isosorbide mononitrate (IMDUR) 30 MG 24 hr tablet Take 30 mg by mouth daily.  . metFORMIN (GLUCOPHAGE) 500 MG tablet Take by mouth 2 (two) times daily with a meal.  . metoprolol succinate (TOPROL-XL) 50 MG 24 hr tablet Take 50 mg by mouth daily. Take with or immediately following a meal.  . nitroGLYCERIN (NITROSTAT) 0.4 MG SL tablet Place 1 tablet under the tongue every 5 (five) minutes as needed.  . sacubitril-valsartan (ENTRESTO) 24-26 MG Take 1 tablet by mouth 2 (two) times daily.  . tamsulosin (FLOMAX) 0.4 MG CAPS capsule Take 1 capsule (0.4 mg total) by mouth daily.    FAMILY HISTORY:  His indicated that the status of his mother is unknown. He  indicated that the status of his neg hx is unknown.   SOCIAL HISTORY: He  reports that  has never smoked. he has never used smokeless tobacco. He reports that he does not drink alcohol or use drugs.  REVIEW OF SYSTEMS:   Constitutional: Negative for fever and chills.  HENT: Negative for congestion and rhinorrhea.  Eyes: Negative for redness and visual disturbance.  Respiratory: Reports mild  shortness of breath but denied wheezing.  Cardiovascular: Reports mild chest discomfort and palpitations.  Gastrointestinal: Negative  for nausea , vomiting and abdominal pain and  Loose stools Genitourinary: Negative for dysuria and urgency.  Endocrine: Denies polyuria, polyphagia and heat intolerance Musculoskeletal: Negative for myalgias and arthralgias.  Skin: Negative for pallor and wound.  Neurological: Negative for dizziness and headaches   SUBJECTIVE:  Pleasant, awake, in no distress VITAL SIGNS: BP 105/65 (BP Location: Right Arm)   Pulse 88   Temp 98.2 F (36.8 C) (Oral)   Resp 19   Ht 5\' 8"  (1.727 m)   Wt 90.4 kg (199 lb 4.7 oz)   SpO2 97%   BMI 30.30 kg/m   HEMODYNAMICS:    VENTILATOR SETTINGS:    INTAKE / OUTPUT: I/O last 3 completed shifts: In: -  Out: 150 [Urine:150]  PHYSICAL EXAMINATION: General: Awake, in no acute distress Neuro: Alert and oriented x4, no focal deficits HEENT: PERRLA, trachea midline, neck supple without JVD Cardiovascular: Apical pulse regular, S1-S2, no murmur regurg or gallop, +2 pulses bilaterally, no edema Lungs: Bilateral breath sounds, no wheezes or rhonchi Abdomen: Obese, normal bowel sounds, palpation reveals no organomegaly Musculoskeletal: No joint deformities, no joint swelling Skin: Dry with no rash  LABS:  BMET Recent Labs  Lab 03/29/17 0550 03/29/17 1907  NA 137  --   K 3.8 3.6  CL 102  --   CO2 24  --   BUN 18  --   CREATININE 1.18  --   GLUCOSE 191*  --     Electrolytes Recent Labs  Lab 03/29/17 0550 03/29/17 1907  CALCIUM 8.4*  --   MG  --  1.5*    CBC Recent Labs  Lab 03/29/17 0550 03/29/17 0920 03/29/17 1508  WBC 9.2 10.1  --   HGB 12.5* 11.3* 11.6*  HCT 36.6* 33.5*  --   PLT 178 168  --     Coag's No results for input(s): APTT, INR in the last 168 hours.  Sepsis Markers No results for input(s): LATICACIDVEN, PROCALCITON, O2SATVEN in the last 168 hours.  ABG No results for  input(s): PHART, PCO2ART, PO2ART in the last 168 hours.  Liver Enzymes No results for input(s): AST, ALT, ALKPHOS, BILITOT, ALBUMIN in the last 168 hours.  Cardiac Enzymes Recent Labs  Lab 03/29/17 0920 03/29/17 1235 03/29/17 1806  TROPONINI 0.07* 0.07* 0.06*    Glucose Recent Labs  Lab 03/29/17 2240  GLUCAP 283*    Imaging Dg Chest 2 View  Result Date: 03/29/2017 CLINICAL DATA:  Initial evaluation for acute chest pain, shortness of breath. EXAM: CHEST  2 VIEW COMPARISON:  Prior radiograph from 03/13/2015. FINDINGS: Moderate cardiomegaly, stable. Left-sided pacemaker/AICD. Mediastinal silhouette normal. Lungs normally inflated. Perihilar vascular congestion without pulmonary edema. No pleural effusion. No focal infiltrates. No pneumothorax. No acute osseus abnormality. IMPRESSION: 1. Stable cardiomegaly with mild perihilar vascular congestion without overt pulmonary edema. 2. No other active cardiopulmonary disease. Electronically Signed   By: Rise Mu M.D.   On: 03/29/2017 06:33    SIGNIFICANT EVENTS: 03/29/2017: Admitted  LINES/TUBES: Peripheral IVs  DISCUSSION: 67 year old male with an extensive cardiac history presenting with new onset arrhythmia; heart rate now controlled  ASSESSMENT  Arrhythmia-VT with a pulse versus Afib with RVR; EKG with non-specific wide complex rhythm. Patient has a pacemaker Acute on chronic colitis Acute blood loss anemia Non-ischemic cardiomyopathy s/p ICD placement H/O CHF,   PLAN Heart rate is back to baseline and patient's blood pressure is stable Stat labs Stat chest x-ray reviewed Continue all medications as scheduled Follow-up cardiology consult in the morning GI and DVT prophylaxis FAMILY  - Updates: Patient updated on current treatment plan  - Inter-disciplinary family meet or Palliative Care meeting due by: day 7   Donald Jacque S. [facility] ANP-BC Pulmonary and Critical Care Medicine [facility] Pager  [phone] or [phone] 03/29/2017, 11:07 PM

## 2017-03-29 NOTE — ED Provider Notes (Signed)
[facility] Emergency Department Provider Note   ____________________________________________    I have reviewed the triage vital signs and the nursing notes.   HISTORY  Chief Complaint Urinary Retention Chest pain    HPI Derek Barnes is a 67 y.o. male who was seen in the ED earlier today but had to leave AMA to go to urology appointment.  Patient notes that he had urinary retention on Christmas day and has had a catheter since then, he is desperate to have it removed.  He came to the emergency department this morning because of an episode of rectal bleeding and chest pain which occurred last night.  Patient reports a history of chronic rectal bleeding this appears to be in keeping with his prior episodes however he is concerned that his hemoglobin has gotten low because he felt dizzy last night as well.  Additionally he had a period of palpitations last night and some chest pressure.  He took a nitroglycerin which did help.  No chest pain this morning.  Currently feels well.  Urologist said they will not remove the catheter currently   Past Medical History:  Diagnosis Date  . CHF (congestive heart failure) (HCC)   . Chronic kidney disease (CKD)   . Colitis   . Decreased cardiac ejection fraction    EF 5-10%  . Dysrhythmia   . Hypertension   . Liver abscess   . Myocardial infarct (HCC)   . Nonischemic cardiomyopathy (HCC)     There are no active problems to display for this patient.   Past Surgical History:  Procedure Laterality Date  . ACD DEFIBRILLATOR    . COLONOSCOPY    . COLONOSCOPY WITH PROPOFOL N/A 01/23/2017   Procedure: COLONOSCOPY WITH PROPOFOL;  Surgeon: Toledo, Boykin Nearing, MD;  Location: [facility] ENDOSCOPY;  Service: Gastroenterology;  Laterality: N/A;  . FRACTURE SURGERY    . HERNIA REPAIR    . INSERT / REPLACE / REMOVE PACEMAKER    . KNEE ARTHROSCOPY    . SHOULDER ARTHROSCOPY      Prior to Admission medications   Medication  Sig Start Date End Date Taking? Authorizing Provider  amiodarone (PACERONE) 200 MG tablet Take 200 mg by mouth 2 (two) times daily.   Yes [provider]  aspirin EC 81 MG tablet Take 81 mg by mouth daily.   Yes [provider]  atorvastatin (LIPITOR) 40 MG tablet Take 40 mg by mouth daily.   Yes [provider]  Budesonide ER 9 MG TB24 Take 1 tablet by mouth daily.   Yes [provider]  bumetanide (BUMEX) 1 MG tablet Take 1 mg by mouth daily.   Yes [provider]  digoxin (LANOXIN) 0.125 MG tablet Take 1 tablet by mouth daily.    Yes [provider]  eplerenone (INSPRA) 50 MG tablet Take 50 mg by mouth daily.   Yes [provider]  isosorbide mononitrate (IMDUR) 30 MG 24 hr tablet Take 30 mg by mouth daily.   Yes [provider]  metFORMIN (GLUCOPHAGE) 500 MG tablet Take by mouth 2 (two) times daily with a meal.   Yes [provider]  metoprolol succinate (TOPROL-XL) 50 MG 24 hr tablet Take 50 mg by mouth daily. Take with or immediately following a meal.   Yes [provider]  nitroGLYCERIN (NITROSTAT) 0.4 MG SL tablet Place 1 tablet under the tongue every 5 (five) minutes as needed. 01/19/17  Yes [provider]  sacubitril-valsartan (  ENTRESTO) 24-26 MG Take 1 tablet by mouth 2 (two) times daily.   Yes [provider]  tamsulosin (FLOMAX) 0.4 MG CAPS capsule Take 1 capsule (0.4 mg total) by mouth daily. 03/19/17  Yes Myrna Blazer, MD     Allergies Contrast media [iodinated diagnostic agents]; Penicillins; and Sotalol  Family History  Problem Relation Age of Onset  . Prostate cancer Neg Hx   . Bladder Cancer Neg Hx   . Kidney cancer Neg Hx     Social History Social History   Tobacco Use  . Smoking status: Never Smoker  . Smokeless tobacco: Never Used  Substance Use Topics  . Alcohol use: No  . Drug use: No    Review of Systems  Constitutional: No  fever/chills Eyes: No visual changes.  ENT: No sore throat. Cardiovascular: Denies chest pain. Respiratory: Denies shortness of breath. Gastrointestinal: No abdominal pain.  No nausea, no vomiting.   Genitourinary: Negative for dysuria. Musculoskeletal: Negative for back pain. Skin: Negative for rash. Neurological: Negative for headaches or weakness   ____________________________________________   PHYSICAL EXAM:  VITAL SIGNS: ED Triage Vitals  Enc Vitals Group     BP 03/29/17 0855 130/73     Pulse Rate 03/29/17 0855 96     Resp 03/29/17 0855 18     Temp 03/29/17 0855 98.3 F (36.8 C)     Temp Source 03/29/17 0855 Oral     SpO2 03/29/17 0855 96 %     Weight 03/29/17 0849 88 kg (194 lb)     Height 03/29/17 0849 1.727 m (5\' 8" )     Head Circumference --      Peak Flow --      Pain Score 03/29/17 0849 5     Pain Loc --      Pain Edu? --      Excl. in GC? --     Constitutional: Alert and oriented. No acute distress. Pleasant and interactive Eyes: Conjunctivae are normal.   Nose: No congestion/rhinnorhea. Mouth/Throat: Mucous membranes are moist.    Cardiovascular: Normal rate, regular rhythm. Grossly normal heart sounds.  Good peripheral circulation.  Defibrillator left anterior chest wall Respiratory: Normal respiratory effort.  No retractions. Lungs CTAB. Gastrointestinal: Soft and nontender. No distention.  No CVA tenderness. Genitourinary: Foley catheter in place Musculoskeletal:  Warm and well perfused Neurologic:  Normal speech and language. No gross focal neurologic deficits are appreciated.  Skin:  Skin is warm, dry and intact. No rash noted. Psychiatric: Mood and affect are normal. Speech and behavior are normal.  ____________________________________________   LABS (all labs ordered are listed, but only abnormal results are displayed)  Labs Reviewed  CBC - Abnormal; Notable for the following components:      Result Value   RBC 3.75 (*)    Hemoglobin  11.3 (*)    HCT 33.5 (*)    RDW 16.1 (*)    All other components within normal limits  TROPONIN I - Abnormal; Notable for the following components:   Troponin I 0.07 (*)    All other components within normal limits   ____________________________________________  EKG  ED ECG REPORT I, Jene Every, the attending physician, personally viewed and interpreted this ECG.  Date: 03/29/2017  Rate: 93 Rhythm: Ventricular paced rhythm QRS Axis: normal Intervals: normal ST/T Wave abnormalities: Nonspecific changes   ____________________________________________  RADIOLOGY  None ____________________________________________   PROCEDURES  Procedure(s) performed: No  Procedures   Critical Care performed: No ____________________________________________   INITIAL IMPRESSION /  ASSESSMENT AND PLAN / ED COURSE  Pertinent labs & imaging results that were available during my care of the patient were reviewed by me and considered in my medical decision making (see chart for details).  Patient is return to the emergency department after leaving AMA earlier today.  On review of records his initial troponin was 0.06, however looking back in the medical record he frequently has mildly elevated troponins.  We will recheck troponin, recheck hemoglobin.  Exam is overall reassuring, vitals normal.  Patient well-appearing in no acute distress, primarily frustrated that he still has his catheter.  ----------------------------------------- 10:57 AM on 03/29/2017 -----------------------------------------  Troponin has increased very slightly, more concerning hemoglobin has dropped by a gram, will admit for further management to the hospitalist service    ____________________________________________   FINAL CLINICAL IMPRESSION(S) / ED DIAGNOSES  Final diagnoses:  Gastrointestinal hemorrhage, unspecified gastrointestinal hemorrhage type  Chest pain, unspecified type  Elevated troponin         Note:  This document was prepared using Dragon voice recognition software and may include unintentional dictation errors.    Jene Every, MD 03/29/17 1101

## 2017-03-29 NOTE — ED Notes (Signed)
Notified ED provider of patient's troponin level of 0.07

## 2017-03-29 NOTE — Discharge Instructions (Signed)
As we discussed it is important to have your hemoglobin level and cardiac markers rechecked. Please seek medical attention for any high fevers, chest pain, shortness of breath, change in behavior, persistent vomiting, bloody stool or any other new or concerning symptoms.

## 2017-03-29 NOTE — Progress Notes (Signed)
Patient starting having chest pain and telemetry called with report of sustained VT for over 1 minute.  Rapid response called and patient was sent to ICU.  I called Dr. Daphane Shepherd and was instructed to call Dr. Mariah Milling, cardiologist on call.  He stated that patient's ICD is probably in need of reprogramming.  He will look at patient's chart and put in consult.  Report given to [facility] in ICU.  Patient tranported safely to unit.  Lennon Alstrom N  03/30/2017  12:01 AM

## 2017-03-29 NOTE — ED Notes (Signed)
Pt currently appears pale and states he feels weak, and SOB. Pt O2 sat remains 97 on room air. No labored breathing noted. Pt currently denies any pain at this time

## 2017-03-29 NOTE — ED Triage Notes (Signed)
Here this morning and left AMA to make urology appt and now checking back in for same sx of urinary retention.

## 2017-03-29 NOTE — H&P (Signed)
[employer] [facility] [place] [facility]   PATIENT NAME: Derek Barnes    MR#:  [id number]  DATE OF BIRTH:  11-22-1950  DATE OF ADMISSION:  03/29/2017  PRIMARY CARE PHYSICIAN: Joanna Hews, MD   REQUESTING/REFERRING PHYSICIAN: Dr. Jene Every  CHIEF COMPLAINT:   Chief Complaint  Patient presents with  . Urinary Retention    HISTORY OF PRESENT ILLNESS:  Derek Barnes  is a 67 y.o. male with a known history of idiopathic cardiomyopathy, non-ischemic congestive heart failure with last EF of 5-10%, ulcerative colitis, hypertension, CK D presents to hospital secondary to rectal bleed. Patient was in the emergency room about a week ago for urinary retention and was started on Flomax and was given an urology appointment. Today he was supposed to get his Foley catheter out to the urology office but had been having episodes of blood in his stool for almost 2 weeks now. It has been much worse yesterday and this morning that he came to the emergency room. His hemoglobin has dropped here from 13-12 within few hours without any fluids. He says his bleeding is bright red, no dark blood. It's not mixed with stool. Denies any abdominal cramping associated with it. Had a colonoscopy done 2 months ago which showed the area of chronic colitis/ulcerative colitis in his sigmoid colon and internal hemorrhoids. He is being admitted under observation for rectal bleed due to drop in his hemoglobin. Denies any nausea or vomiting, no fevers or chills and no other complaints. No active chest pain at this time.  PAST MEDICAL HISTORY:   Past Medical History:  Diagnosis Date  . CHF (congestive heart failure) (HCC)   . Chronic kidney disease (CKD)   . Colitis   . Decreased cardiac ejection fraction    EF 5-10%  . Dysrhythmia   . Hypertension   . Liver abscess   . Myocardial infarct (HCC)   . Nonischemic cardiomyopathy (HCC)     PAST SURGICAL HISTORY:   Past Surgical History:  Procedure  Laterality Date  . ACD DEFIBRILLATOR    . COLONOSCOPY    . COLONOSCOPY WITH PROPOFOL N/A 01/23/2017   Procedure: COLONOSCOPY WITH PROPOFOL;  Surgeon: Toledo, Boykin Nearing, MD;  Location: [facility] ENDOSCOPY;  Service: Gastroenterology;  Laterality: N/A;  . FRACTURE SURGERY    . HERNIA REPAIR    . INSERT / REPLACE / REMOVE PACEMAKER    . KNEE ARTHROSCOPY    . SHOULDER ARTHROSCOPY      SOCIAL HISTORY:   Social History   Tobacco Use  . Smoking status: Never Smoker  . Smokeless tobacco: Never Used  Substance Use Topics  . Alcohol use: No    FAMILY HISTORY:   Family History  Problem Relation Age of Onset  . Cardiomyopathy Mother   . Prostate cancer Neg Hx   . Bladder Cancer Neg Hx   . Kidney cancer Neg Hx     DRUG ALLERGIES:   Allergies  Allergen Reactions  . Contrast Media [Iodinated Diagnostic Agents] Anaphylaxis  . Penicillins Anaphylaxis    Has patient had a PCN reaction causing immediate rash, facial/tongue/throat swelling, SOB or lightheadedness with hypotension: Yes Has patient had a PCN reaction causing severe rash involving mucus membranes or skin necrosis: Yes Has patient had a PCN reaction that required hospitalization: No Has patient had a PCN reaction occurring within the last 10 years: No If all of the above answers are "NO", then may proceed with Cephalosporin use.   . Sotalol  Other (See Comments)    Excessive fatigue    REVIEW OF SYSTEMS:   Review of Systems  Constitutional: Positive for malaise/fatigue. Negative for chills, fever and weight loss.  HENT: Negative for ear discharge, ear pain, hearing loss and nosebleeds.   Eyes: Negative for blurred vision, double vision and photophobia.  Respiratory: Negative for cough, hemoptysis, shortness of breath and wheezing.   Cardiovascular: Positive for orthopnea. Negative for chest pain, palpitations and leg swelling.  Gastrointestinal: Positive for blood in stool and nausea. Negative for abdominal pain,  constipation, diarrhea, heartburn, melena and vomiting.  Genitourinary: Negative for dysuria, frequency, hematuria and urgency.  Musculoskeletal: Negative for back pain, myalgias and neck pain.  Skin: Negative for rash.  Neurological: Negative for dizziness, tingling, tremors, sensory change, speech change, focal weakness and headaches.  Endo/Heme/Allergies: Does not bruise/bleed easily.  Psychiatric/Behavioral: Negative for depression.    MEDICATIONS AT HOME:   Prior to Admission medications   Medication Sig Start Date End Date Taking? Authorizing Provider  amiodarone (PACERONE) 200 MG tablet Take 200 mg by mouth 2 (two) times daily.   Yes [provider]  aspirin EC 81 MG tablet Take 81 mg by mouth daily.   Yes [provider]  atorvastatin (LIPITOR) 40 MG tablet Take 40 mg by mouth daily.   Yes [provider]  Budesonide ER 9 MG TB24 Take 1 tablet by mouth daily.   Yes [provider]  bumetanide (BUMEX) 1 MG tablet Take 1 mg by mouth daily.   Yes [provider]  digoxin (LANOXIN) 0.125 MG tablet Take 1 tablet by mouth daily.    Yes [provider]  eplerenone (INSPRA) 50 MG tablet Take 50 mg by mouth daily.   Yes [provider]  isosorbide mononitrate (IMDUR) 30 MG 24 hr tablet Take 30 mg by mouth daily.   Yes [provider]  metFORMIN (GLUCOPHAGE) 500 MG tablet Take by mouth 2 (two) times daily with a meal.   Yes [provider]  metoprolol succinate (TOPROL-XL) 50 MG 24 hr tablet Take 50 mg by mouth daily. Take with or immediately following a meal.   Yes [provider]  nitroGLYCERIN (NITROSTAT) 0.4 MG SL tablet Place 1 tablet under the tongue every 5 (five) minutes as needed. 01/19/17  Yes [provider]  sacubitril-valsartan (ENTRESTO) 24-26 MG Take 1 tablet by mouth 2 (two) times daily.   Yes [provider]  tamsulosin (FLOMAX) 0.4 MG CAPS capsule Take 1 capsule (0.4 mg  total) by mouth daily. 03/19/17  Yes Schaevitz, Myra Rude, MD      VITAL SIGNS:  Blood pressure (!) 102/54, pulse 85, temperature 98.3 F (36.8 C), temperature source Oral, resp. rate 18, height 5\' 8"  (1.727 m), weight 88 kg (194 lb), SpO2 96 %.  PHYSICAL EXAMINATION:   Physical Exam  GENERAL:  67 y.o.-year-old patient lying in the bed with no acute distress.  EYES: Pupils equal, round, reactive to light and accommodation. No scleral icterus. Extraocular muscles intact.  HEENT: Head atraumatic, normocephalic. Oropharynx and nasopharynx clear.  NECK:  Supple, no jugular venous distention. No thyroid enlargement, no tenderness.  LUNGS: Normal breath sounds bilaterally, no wheezing, rales,rhonchi or crepitation. No use of accessory muscles of respiration.  CARDIOVASCULAR: S1, S2 normal. No  rubs, or gallops. 2/6 systolic murmur present ABDOMEN: Soft, nontender, nondistended. Bowel sounds present. No organomegaly or mass.  EXTREMITIES: No pedal edema, cyanosis, or clubbing.  NEUROLOGIC: Cranial nerves II through XII are intact. Muscle  strength 5/5 in all extremities. Sensation intact. Gait not checked.  PSYCHIATRIC: The patient is alert and oriented x 3.  SKIN: No obvious rash, lesion, or ulcer.   LABORATORY PANEL:   CBC Recent Labs  Lab 03/29/17 0920  WBC 10.1  HGB 11.3*  HCT 33.5*  PLT 168   ------------------------------------------------------------------------------------------------------------------  Chemistries  Recent Labs  Lab 03/29/17 0550  NA 137  K 3.8  CL 102  CO2 24  GLUCOSE 191*  BUN 18  CREATININE 1.18  CALCIUM 8.4*   ------------------------------------------------------------------------------------------------------------------  Cardiac Enzymes Recent Labs  Lab 03/29/17 0920  TROPONINI 0.07*   ------------------------------------------------------------------------------------------------------------------  RADIOLOGY:  Dg Chest 2  View  Result Date: 03/29/2017 CLINICAL DATA:  Initial evaluation for acute chest pain, shortness of breath. EXAM: CHEST  2 VIEW COMPARISON:  Prior radiograph from 03/13/2015. FINDINGS: Moderate cardiomegaly, stable. Left-sided pacemaker/AICD. Mediastinal silhouette normal. Lungs normally inflated. Perihilar vascular congestion without pulmonary edema. No pleural effusion. No focal infiltrates. No pneumothorax. No acute osseus abnormality. IMPRESSION: 1. Stable cardiomegaly with mild perihilar vascular congestion without overt pulmonary edema. 2. No other active cardiopulmonary disease. Electronically Signed   By: Rise Mu M.D.   On: 03/29/2017 06:33    EKG:   Orders placed or performed during the hospital encounter of 03/29/17  . EKG 12-Lead  . EKG 12-Lead    IMPRESSION AND PLAN:   Derek Barnes  is a 67 y.o. male with a known history of idiopathic cardiomyopathy, non-ischemic congestive heart failure with last EF of 5-10%, ulcerative colitis, hypertension, CK D presents to hospital secondary to rectal bleed.  1. Rectal bleed-either internal hemorrhoids or from his colitis with active flare. -No fevers or chills, no elevated white count. -Continue Entocort for now. Hemoglobin checked every 8 hours and transfuse if hemoglobin is less than 7 -GI consult for any other recommendations. -Just had a colonoscopy at the end of October 2018. -Hold aspirin. Not on any other anticoagulation -Liquid diet for now  2. Idiopathic cardiomyopathy haven't has known history of nonischemic idiopathic cardiomyopathy and EF of 5-10% -Follows with cardiology at [facility] - On cardiac medications, has usually issues with increased fluid gain. Continue his entresto, Bumex, Toprol, Imdur if blood pressure permits.  3. Acute urinary retention-Foley catheter was placed in last week, as outpatient neurology follow-up today. Continue Flomax -Discontinue Foley and do a voiding trial while in the hospital. If  unable to void, being set Foley and patient can follow up with urology as outpatient.  4. Paroxysmal atrial fibrillation-not on any anticoagulation. Hold aspirin -Rate is controlled. On amiodarone and digoxin. -Metoprolol. -Patient is status post AICD for history of V. fib arrest. Continue cardiology follow up as outpatient  5. DVT prophylaxis-Ted's and SCDs    All the records are reviewed and case discussed with ED provider. Management plans discussed with the patient, family and they are in agreement.  CODE STATUS: Full Code  TOTAL TIME TAKING CARE OF THIS PATIENT: 55 minutes.    Enid Baas M.D on 03/29/2017 at 12:09 PM  Between 7am to 6pm - Pager - [phone]  After 6pm go to [email or web address] - Social research officer, government  [facility]  Office  [phone]  CC: Primary care physician; Joanna Hews, MD

## 2017-03-29 NOTE — Progress Notes (Signed)
CH responded to a PG for a Rapid Response in room 220. Medical team is attending to the Pt on my arrival. No family is present at this time. CH received a PG for End of Life and excused himself. CH will monitor this Pt as needed. Pt is transferred to IC-09.    03/29/17 2300  Clinical Encounter Type  Visited With Patient;Health care provider  Visit Type Initial;Spiritual support;Code (Rapid Response)  Referral From Nurse  Consult/Referral To Chaplain

## 2017-03-29 NOTE — ED Triage Notes (Signed)
Pt arrives via EMS from home with reports of GI bleeding x2 weeks, chest pain and SOB that started yesterday afternoon. EMS reports pt "took nitro and passed out". EMS reports pt had a colonoscopy 2 weeks ago. Pt states he currently feels "SOB, weak, and tired". Pt nail beds are currently pale, resp 21, O2 100% on room air

## 2017-03-29 NOTE — Progress Notes (Signed)
03/29/2017 8:41 AM   Derek Barnes [birth year] [id number]  Referring provider: Joanna Hews, MD [address] [facility] [place], [place] [address]  Chief Complaint  Patient presents with  . Urinary Retention    HPI: The patient is a 67 year old gentleman with multiple medical comorbidities including CKD, CHF, and history of MI who presents for evaluation of difficulty with urination.  Per ED provider note, Foley catheter was placed due to his difficulty with urination only 100 cc was evacuated.  However, it is documented that he had over 999 cc on bladder scan.  The patient was started on Flomax while in the emergency department.  This is the first time he had voiding difficulty.  He notes he was unable to urinate.  Of note, the patient was seen in the emergency department this morning for bright red blood per rectum.  He then left before complete testing was done to make it his appointment.  He has been instructed to return to the ER after his appointment for further testing and imaging studies   PMH: Past Medical History:  Diagnosis Date  . CHF (congestive heart failure) (HCC)   . Chronic kidney disease (CKD)   . Colitis   . Decreased cardiac ejection fraction    EF 5-10%  . Dysrhythmia   . Hypertension   . Liver abscess   . Myocardial infarct (HCC)   . Nonischemic cardiomyopathy [facility])     Surgical History: Past Surgical History:  Procedure Laterality Date  . ACD DEFIBRILLATOR    . COLONOSCOPY    . COLONOSCOPY WITH PROPOFOL N/A 01/23/2017   Procedure: COLONOSCOPY WITH PROPOFOL;  Surgeon: Toledo, Boykin Nearing, MD;  Location: [facility] ENDOSCOPY;  Service: Gastroenterology;  Laterality: N/A;  . FRACTURE SURGERY    . HERNIA REPAIR    . INSERT / REPLACE / REMOVE PACEMAKER    . KNEE ARTHROSCOPY    . SHOULDER ARTHROSCOPY      Home Medications:  Allergies as of 03/29/2017      Reactions   Penicillins Anaphylaxis   Contrast Media [iodinated Diagnostic Agents]      Sotalol Other (See Comments)   Excessive fatigue      Medication List        Accurate as of 03/29/17  8:41 AM. Always use your most recent med list.          amiodarone 200 MG tablet Commonly known as:  PACERONE Take 200 mg by mouth 2 (two) times daily.   aspirin EC 81 MG tablet Take 81 mg by mouth daily.   atorvastatin 40 MG tablet Commonly known as:  LIPITOR Take 40 mg by mouth daily.   bumetanide 1 MG tablet Commonly known as:  BUMEX Take 1 mg by mouth daily.   DIGOXIN PO Take by mouth daily.   ENTRESTO 24-26 MG Generic drug:  sacubitril-valsartan Take 1 tablet by mouth 2 (two) times daily.   eplerenone 50 MG tablet Commonly known as:  INSPRA Take 50 mg by mouth daily.   isosorbide mononitrate 30 MG 24 hr tablet Commonly known as:  IMDUR Take 30 mg by mouth daily.   metFORMIN 500 MG tablet Commonly known as:  GLUCOPHAGE Take by mouth 2 (two) times daily with a meal.   metoprolol succinate 50 MG 24 hr tablet Commonly known as:  TOPROL-XL Take 50 mg by mouth daily. Take with or immediately following a meal.   nitroGLYCERIN 0.4 MG SL tablet Commonly known as:  NITROSTAT Place  1 tablet under the tongue every 5 (five) minutes as needed.   tamsulosin 0.4 MG Caps capsule Commonly known as:  FLOMAX Take 1 capsule (0.4 mg total) by mouth daily.   UCERIS 9 MG Tb24 Generic drug:  Budesonide ER Take 1 tablet by mouth daily.       Allergies:  Allergies  Allergen Reactions  . Penicillins Anaphylaxis  . Contrast Media [Iodinated Diagnostic Agents]   . Sotalol Other (See Comments)    Excessive fatigue    Family History: Family History  Problem Relation Age of Onset  . Prostate cancer Neg Hx   . Bladder Cancer Neg Hx   . Kidney cancer Neg Hx     Social History:  reports that  has never smoked. he has never used smokeless tobacco. He reports that he does not drink alcohol or use drugs.  ROS: UROLOGY Frequent Urination?: No Hard to postpone  urination?: No Burning/pain with urination?: Yes Get up at night to urinate?: Yes Leakage of urine?: No Urine stream starts and stops?: Yes Trouble starting stream?: Yes Do you have to strain to urinate?: Yes Blood in urine?: No Urinary tract infection?: No Sexually transmitted disease?: No Injury to kidneys or bladder?: No Painful intercourse?: No Weak stream?: No Erection problems?: No Penile pain?: No  Gastrointestinal Nausea?: No Vomiting?: No Indigestion/heartburn?: No Diarrhea?: Yes Constipation?: Yes  Constitutional Fever: No Night sweats?: No Weight loss?: No Fatigue?: No  Skin Skin rash/lesions?: No Itching?: No  Eyes Blurred vision?: No Double vision?: No  Ears/Nose/Throat Sore throat?: No Sinus problems?: No  Hematologic/Lymphatic Swollen glands?: No Easy bruising?: No  Cardiovascular Leg swelling?: No Chest pain?: Yes  Respiratory Cough?: No Shortness of breath?: No  Endocrine Excessive thirst?: No  Musculoskeletal Back pain?: No Joint pain?: No  Neurological Headaches?: No Dizziness?: No  Psychologic Depression?: No Anxiety?: No  Physical Exam: BP 128/70 (BP Location: Right Arm, Patient Position: Sitting, Cuff Size: Normal)   Pulse (!) 108   Ht 5\' 8"  (1.727 m)   Wt 194 lb 3.2 oz (88.1 kg)   BMI 29.53 kg/m   Constitutional:  Alert and oriented, No acute distress. HEENT: [place] AT, moist mucus membranes.  Trachea midline, no masses. Cardiovascular: No clubbing, cyanosis, or edema. Respiratory: Normal respiratory effort, no increased work of breathing. GI: Abdomen is soft, nontender, nondistended, no abdominal masses GU: No CVA tenderness.  Skin: No rashes, bruises or suspicious lesions. Lymph: No cervical or inguinal adenopathy. Neurologic: Grossly intact, no focal deficits, moving all 4 extremities. Psychiatric: Normal mood and affect.  Laboratory Data: Lab Results  Component Value Date   WBC 9.2 03/29/2017   HGB 12.5  (L) 03/29/2017   HCT 36.6 (L) 03/29/2017   MCV 88.8 03/29/2017   PLT 178 03/29/2017    Lab Results  Component Value Date   CREATININE 1.18 03/29/2017    No results found for: PSA  No results found for: TESTOSTERONE  Lab Results  Component Value Date   HGBA1C 7.7 (H) 05/13/2013    Urinalysis    Component Value Date/Time   COLORURINE YELLOW (A) 03/19/2017 0547   APPEARANCEUR CLEAR (A) 03/19/2017 0547   APPEARANCEUR Hazy 05/12/2013 1108   LABSPEC 1.021 03/19/2017 0547   LABSPEC 1.014 05/12/2013 1108   PHURINE 6.0 03/19/2017 0547   GLUCOSEU 150 (A) 03/19/2017 0547   GLUCOSEU Negative 05/12/2013 1108   HGBUR SMALL (A) 03/19/2017 0547   BILIRUBINUR NEGATIVE 03/19/2017 0547   KETONESUR 5 (A) 03/19/2017 0547   PROTEINUR 30 (  A) 03/19/2017 0547   NITRITE NEGATIVE 03/19/2017 0547   LEUKOCYTESUR NEGATIVE 03/19/2017 0547   LEUKOCYTESUR Negative 05/12/2013 1108    Assessment & Plan:    1.  Difficulty with urination I discussed with the patient that he cannot undergo a true trial of void if he is being required to return to the emergency department for bright red blood per rectum.  He does need further testing for this which I feel is more important than a trial of void.  I did discuss with him due to his high volume urinary retention that there is a good chance that he will not pass his trial of void, so he needs close follow-up for this with an afternoon PVR.  This is not possible if he needs to be readmitted to the hospital.  I have instructed him to return to the emergency department to complete his testing for bright red stool per rectum.  We will arrange for him to undergo a nurse visit once he is discharged hopefully next week for a trial of void.  Return for AM next week NV for TOV.  Hildred Laser, MD  [facility] [address] [place], [place] [address] [phone]

## 2017-03-29 NOTE — ED Notes (Signed)
Called report to Stefano Gaul

## 2017-03-29 NOTE — Progress Notes (Signed)
Pt ran a 50's run of V tach called Dr. Nemiah Commander and she ordered me to give pt 0.4 Nitro tablet and if his systolic is >105 and if it continues give metoprolol 2.5 IV 1 time push. Pt stated he felt better and refused the nitro so I will continue to monitor.

## 2017-03-29 NOTE — Consult Note (Signed)
[name], MD [facility]  [address], [address] [place], [place] [address] Phone: [phone] Fax : [phone]  Consultation  Referring Provider:     Dr. Nemiah Commander Primary Care Physician:  Joanna Hews, MD Primary Gastroenterologist:  Dr. Servando Snare         Reason for Consultation:     Ulcerative colitis  Date of Admission:  03/29/2017 Date of Consultation:  03/29/2017         HPI:   Derek Barnes is a 67 y.o. male who was admitted with urinary retention. The patient reports that he had been having some chest pain at home and took a sublingual nitroglycerin and fainted. The patient states that he must been rolling around on the floor and was covered with blood. The patient has a history of ulcerative colitis with a colonoscopy back on October 2018. He reports that he went back upstairs the next day he had another episode of chest pain and he took a nitroglycerin again went into bed and woke up with blood again. The patient states he has been having off-and-on bleeding for the last 2 years. The patient was in the hospital around Christmas time and reports that he was started on budesonide at that time which has not helped him. The patient denies any chronic treatment for his ulcerative colitis. The patient had been seen by me back in 2015 for ulcerative colitis. He was reports that there is some small amount of blood but not to the extent that he came in with. The patient's hemoglobin was found to have dropped on admission. The patient's hemoglobin on the 25th of this past year was 13.4 the patient now came in with a hemoglobin of 12.5 that was rechecked at 11.3. He denies having any bowel movements since admission. There is no report of any abdominal pain.  Past Medical History:  Diagnosis Date  . CHF (congestive heart failure) (HCC)   . Chronic kidney disease (CKD)   . Colitis   . Decreased cardiac ejection fraction    EF 5-10%  . Dysrhythmia   . Hypertension   . Liver abscess   .  Myocardial infarct (HCC)   . Nonischemic cardiomyopathy [facility])     Past Surgical History:  Procedure Laterality Date  . ACD DEFIBRILLATOR    . COLONOSCOPY    . COLONOSCOPY WITH PROPOFOL N/A 01/23/2017   Procedure: COLONOSCOPY WITH PROPOFOL;  Surgeon: Toledo, Boykin Nearing, MD;  Location: [facility] ENDOSCOPY;  Service: Gastroenterology;  Laterality: N/A;  . FRACTURE SURGERY    . HERNIA REPAIR    . INSERT / REPLACE / REMOVE PACEMAKER    . KNEE ARTHROSCOPY    . SHOULDER ARTHROSCOPY      Prior to Admission medications   Medication Sig Start Date End Date Taking? Authorizing Provider  amiodarone (PACERONE) 200 MG tablet Take 200 mg by mouth 2 (two) times daily.   Yes [provider]  aspirin EC 81 MG tablet Take 81 mg by mouth daily.   Yes [provider]  atorvastatin (LIPITOR) 40 MG tablet Take 40 mg by mouth daily.   Yes [provider]  Budesonide ER 9 MG TB24 Take 1 tablet by mouth daily.   Yes [provider]  bumetanide (BUMEX) 1 MG tablet Take 1 mg by mouth daily.   Yes [provider]  digoxin (LANOXIN) 0.125 MG tablet Take 1 tablet by mouth daily.    Yes [provider]  eplerenone (INSPRA) 50 MG tablet  Take 50 mg by mouth daily.   Yes [provider]  isosorbide mononitrate (IMDUR) 30 MG 24 hr tablet Take 30 mg by mouth daily.   Yes [provider]  metFORMIN (GLUCOPHAGE) 500 MG tablet Take by mouth 2 (two) times daily with a meal.   Yes [provider]  metoprolol succinate (TOPROL-XL) 50 MG 24 hr tablet Take 50 mg by mouth daily. Take with or immediately following a meal.   Yes [provider]  nitroGLYCERIN (NITROSTAT) 0.4 MG SL tablet Place 1 tablet under the tongue every 5 (five) minutes as needed. 01/19/17  Yes [provider]  sacubitril-valsartan (ENTRESTO) 24-26 MG Take 1 tablet by mouth 2 (two) times daily.   Yes [provider]  tamsulosin (FLOMAX) 0.4 MG CAPS capsule Take  1 capsule (0.4 mg total) by mouth daily. 03/19/17  Yes Schaevitz, Myra Rude, MD    Family History  Problem Relation Age of Onset  . Cardiomyopathy Mother   . Prostate cancer Neg Hx   . Bladder Cancer Neg Hx   . Kidney cancer Neg Hx      Social History   Tobacco Use  . Smoking status: Never Smoker  . Smokeless tobacco: Never Used  Substance Use Topics  . Alcohol use: No  . Drug use: No    Allergies as of 03/29/2017 - Review Complete 03/29/2017  Allergen Reaction Noted  . Contrast media [iodinated diagnostic agents] Anaphylaxis 01/22/2017  . Penicillins Anaphylaxis 08/16/2014  . Sotalol Other (See Comments) 10/22/2013    Review of Systems:    All systems reviewed and negative except where noted in HPI.   Physical Exam:  Vital signs in last 24 hours: Temp:  [98 F (36.7 C)-98.3 F (36.8 C)] 98 F (36.7 C) (01/04 1333) Pulse Rate:  [81-108] 85 (01/04 1417) Resp:  [16-22] 17 (01/04 1417) BP: (87-145)/(38-90) 127/76 (01/04 1417) SpO2:  [96 %-100 %] 100 % (01/04 1417) Weight:  [194 lb (88 kg)-200 lb (90.7 kg)] 194 lb (88 kg) (01/04 0849)   General:   Pleasant, cooperative in NAD Head:  Normocephalic and atraumatic. Eyes:   No icterus.   Conjunctiva pink. PERRLA. Ears:  Normal auditory acuity. Neck:  Supple; no masses or thyroidomegaly Lungs: Respirations even and unlabored. Lungs clear to auscultation bilaterally.   No wheezes, crackles, or rhonchi.  Heart:  Regular rate and rhythm;  Without murmur, clicks, rubs or gallops Abdomen:  Soft, nondistended, nontender. Normal bowel sounds. No appreciable masses or hepatomegaly.  No rebound or guarding.  Rectal:  Not performed. Msk:  Symmetrical without gross deformities.    Extremities:  Without edema, cyanosis or clubbing. Neurologic:  Alert and oriented x3;  grossly normal neurologically. Skin:  Intact without significant lesions or rashes. Cervical Nodes:  No significant cervical adenopathy. Psych:  Alert and  cooperative. Normal affect.  LAB RESULTS: Recent Labs    03/29/17 0550 03/29/17 0920  WBC 9.2 10.1  HGB 12.5* 11.3*  HCT 36.6* 33.5*  PLT 178 168   BMET Recent Labs    03/29/17 0550  NA 137  K 3.8  CL 102  CO2 24  GLUCOSE 191*  BUN 18  CREATININE 1.18  CALCIUM 8.4*   LFT No results for input(s): PROT, ALBUMIN, AST, ALT, ALKPHOS, BILITOT, BILIDIR, IBILI in the last 72 hours. PT/INR No results for input(s): LABPROT, INR in the last 72 hours.  STUDIES: Dg Chest 2 View  Result Date: 03/29/2017 CLINICAL DATA:  Initial evaluation for acute chest pain,  shortness of breath. EXAM: CHEST  2 VIEW COMPARISON:  Prior radiograph from 03/13/2015. FINDINGS: Moderate cardiomegaly, stable. Left-sided pacemaker/AICD. Mediastinal silhouette normal. Lungs normally inflated. Perihilar vascular congestion without pulmonary edema. No pleural effusion. No focal infiltrates. No pneumothorax. No acute osseus abnormality. IMPRESSION: 1. Stable cardiomegaly with mild perihilar vascular congestion without overt pulmonary edema. 2. No other active cardiopulmonary disease. Electronically Signed   By: Rise Mu M.D.   On: 03/29/2017 06:33      Impression / Plan:   Derek Barnes is a 67 y.o. y/o male with a history of ulcerative colitis. The patient had a colonoscopy on October 31 of 2018 with sigmoid colitis. The patient is now having rectal bleeding. The biopsies of the right colon transverse colon and descending colon were reported to be normal. The patient will be started on prednisone and mesalamine. He has also been told that this may take some time to start working. Since the patient had a colonoscopy 2 months ago there is no need to repeat the colonoscopy at this time. His rectal bleeding is likely caused from his ulcerative colitis. The patient has been given my card and may need to follow-up with Dr. Allegra Lai as an outpatient for his IBD.  Thank you for involving me in the care of this  patient.      LOS: 0 days   [name], MD  03/29/2017, 3:23 PM   Note: This dictation was prepared with Dragon dictation along with smaller phrase technology. Any transcriptional errors that result from this process are unintentional.

## 2017-03-29 NOTE — Progress Notes (Signed)
Called Dr. Cherlynn Kaiser regarding patient's 50 beat run of Vtach.  Appropriate orders were placed.  Arturo Morton  03/29/2017  8:14 PM

## 2017-03-29 NOTE — ED Provider Notes (Signed)
[facility] Emergency Department Provider Note  ____________________________________________   I have reviewed the triage vital signs and the nursing notes.   HISTORY  Chief Complaint GI Bleeding   History limited by: Not Limited   HPI Derek Barnes is a 67 y.o. male who presents to the emergency department today because of concern for continued GI bleeding.  LOCATION:rectum DURATION:long time, most recently 2 weeks TIMING: worsening QUALITY: red blood CONTEXT: patient states he has a long history of having gi bleeds. It has been worse the past two weeks. States that he usually is treated with steroids. Had colonoscopy performed a couple of months ago.  MODIFYING FACTORS: none ASSOCIATED SYMPTOMS: had chest pain tonight  Per medical record review patient has a history of chf, MI, recent colonoscopy with some friability.   Past Medical History:  Diagnosis Date  . CHF (congestive heart failure) (HCC)   . Chronic kidney disease (CKD)   . Colitis   . Decreased cardiac ejection fraction    EF 5-10%  . Dysrhythmia   . Hypertension   . Liver abscess   . Myocardial infarct (HCC)   . Nonischemic cardiomyopathy (HCC)     There are no active problems to display for this patient.   Past Surgical History:  Procedure Laterality Date  . ACD DEFIBRILLATOR    . COLONOSCOPY    . COLONOSCOPY WITH PROPOFOL N/A 01/23/2017   Procedure: COLONOSCOPY WITH PROPOFOL;  Surgeon: Toledo, Boykin Nearing, MD;  Location: [facility] ENDOSCOPY;  Service: Gastroenterology;  Laterality: N/A;  . FRACTURE SURGERY    . HERNIA REPAIR    . INSERT / REPLACE / REMOVE PACEMAKER    . KNEE ARTHROSCOPY    . SHOULDER ARTHROSCOPY      Prior to Admission medications   Medication Sig Start Date End Date Taking? Authorizing Provider  amiodarone (PACERONE) 200 MG tablet Take 200 mg by mouth 2 (two) times daily.    [provider]  atorvastatin (LIPITOR) 40 MG tablet Take 40 mg by  mouth daily.    [provider]  bumetanide (BUMEX) 1 MG tablet Take 1 mg by mouth daily.    [provider]  carvedilol (COREG) 12.5 MG tablet Take 12.5 mg by mouth 2 (two) times daily with a meal.    [provider]  chlorpheniramine-HYDROcodone (TUSSIONEX PENNKINETIC ER) 10-8 MG/5ML SUER Take 5 mLs by mouth every 12 (twelve) hours as needed for cough. 03/13/15   Tommi Rumps, PA-C  DIGOXIN PO Take by mouth daily.    [provider]  enalapril (VASOTEC) 10 MG tablet Take 10 mg by mouth daily.    [provider]  EPINEPHrine (ADRENALIN) 0.1 % nasal solution Place 1 drop into the nose once.    [provider]  eplerenone (INSPRA) 50 MG tablet Take 50 mg by mouth daily.    [provider]  furosemide (LASIX) 20 MG tablet Take 20 mg by mouth.    [provider]  isosorbide mononitrate (IMDUR) 30 MG 24 hr tablet Take 30 mg by mouth daily.    [provider]  metFORMIN (GLUCOPHAGE) 500 MG tablet Take by mouth 2 (two) times daily with a meal.    [provider]  metoprolol succinate (TOPROL-XL) 50 MG 24 hr tablet Take 50 mg by mouth daily. Take with or immediately following a meal.    [provider]  pantoprazole (PROTONIX) 40 MG tablet Take 40 mg by mouth daily.    [provider]  sacubitril-valsartan (ENTRESTO) 24-26 MG Take 1 tablet by mouth 2 (two) times daily.    [provider]  tamsulosin (FLOMAX) 0.4 MG CAPS capsule Take 1 capsule (0.4 mg total) by mouth daily. 03/19/17   Myrna Blazer, MD    Allergies Penicillins and Contrast media [iodinated diagnostic agents]  History reviewed. No pertinent family history.  Social History Social History   Tobacco Use  . Smoking status: Never Smoker  . Smokeless tobacco: Never Used  Substance Use Topics  . Alcohol use: No  . Drug use: No    Review of Systems Constitutional: No fever/chills Eyes: No visual  changes. ENT: No sore throat. Cardiovascular: Positive for chest pain. Respiratory: Denies shortness of breath. Gastrointestinal: No abdominal pain.  Positive for GI bleeding.  Genitourinary: Negative for dysuria. Musculoskeletal: Negative for back pain. Skin: Negative for rash. Neurological: Negative for headaches, focal weakness or numbness.  ____________________________________________   PHYSICAL EXAM:  VITAL SIGNS: ED Triage Vitals  Enc Vitals Group     BP 03/29/17 0551 136/90     Pulse Rate 03/29/17 0551 99     Resp 03/29/17 0551 (!) 21     Temp 03/29/17 0551 98.2 F (36.8 C)     Temp Source 03/29/17 0551 Oral     SpO2 03/29/17 0544 98 %     Weight 03/29/17 0552 200 lb (90.7 kg)     Height 03/29/17 0552 5\' 8"  (1.727 m)   Constitutional: Alert and oriented. Well appearing and in no distress. Eyes: Conjunctivae are normal.  ENT   Head: Normocephalic and atraumatic.   Nose: No congestion/rhinnorhea.   Mouth/Throat: Mucous membranes are moist.   Neck: No stridor. Hematological/Lymphatic/Immunilogical: No cervical lymphadenopathy. Cardiovascular: Normal rate, regular rhythm.  No murmurs, rubs, or gallops.  Respiratory: Normal respiratory effort without tachypnea nor retractions. Breath sounds are clear and equal bilaterally. No wheezes/rales/rhonchi. Gastrointestinal: Soft and non tender. No rebound. No guarding.  Genitourinary: Deferred Musculoskeletal: Normal range of motion in all extremities. No lower extremity edema. Neurologic:  Normal speech and language. No gross focal neurologic deficits are appreciated.  Skin:  Skin is warm, dry and intact. No rash noted. Psychiatric: Mood and affect are normal. Speech and behavior are normal. Patient exhibits appropriate insight and judgment.  ____________________________________________    LABS (pertinent positives/negatives)  CBC wbc 9.2, hgb 12.5, plt  178  ____________________________________________   EKG  I, Phineas Semen, attending physician, personally viewed and interpreted this EKG  EKG Time: 0726 Rate: 100 Rhythm: ventricular paced rhythm Axis: right axis deviation Intervals: qtc 482 QRS: wide ST changes: no st elevation Impression: abnormal ekg  ____________________________________________    RADIOLOGY  CXR Cardiomegaly, no acute disease   ____________________________________________   PROCEDURES  Procedures  ____________________________________________   INITIAL IMPRESSION / ASSESSMENT AND PLAN / ED COURSE  Pertinent labs & imaging results that were available during my care of the patient were reviewed by me and considered in my medical decision making (see chart for details).  Patient presents to the emergency department today with concern for GI bleed. Patient had recent colonoscopy showing friability. CBC shows slight decrease in hemoglobin today compared to previous. Patient did have an appointment scheduled at 800 that he wanted to go to. Troponin had not yet resulted. I did recommend that he stay for repeat hemoglobin and to await troponin result (and likely 2nd troponin given history). He was adamant that he had to go to the urology appointment. He did state he would return to  the emergency department for this testing. Patient was given steroid injection here to help with GI bleed.   ____________________________________________   FINAL CLINICAL IMPRESSION(S) / ED DIAGNOSES  Final diagnoses:  Gastrointestinal hemorrhage, unspecified gastrointestinal hemorrhage type     Note: This dictation was prepared with Dragon dictation. Any transcriptional errors that result from this process are unintentional     Phineas Semen, MD 03/29/17 [phone]

## 2017-03-29 NOTE — ED Notes (Signed)
Pt transported to room 220 

## 2017-03-29 NOTE — ED Notes (Signed)
Pt leaving A[facility] to [facility]t, MD has informed pt that his blood work has not resulted yet and would like to redraw hg.  Pt states that he will come back after his appt for blood work. PT in NAD at this time, pt in wheelchair to medical mall by Theodoro Grist, tech

## 2017-03-29 NOTE — ED Notes (Signed)
Called patient on mobile phone listed in demographics.  Voice mail left informing patient that a blood test resulted at a high level and that the physician recommends patient to return to ED for further evaluation.

## 2017-03-29 NOTE — Progress Notes (Signed)
Received phone call that patient was having sustained wide complex tachycardia rate 160 bpm, concerning for sustained VT Rapid response called, Details unclear though it would seem his VT converted to NSR, now rate 100 bpm, systolic pressures in the 90s,  He received his evening po amiodarone 200 mg x 1 Moved to ICU for consideration of starting amiodarone infusion  --patient has ICD which may not have delivered treatment if VT rate is below detection (slow VT). Device will likely need to be reprogrammed over the weekend if possible -Consider starting amiodarone infusion Bolus can be given slowly if there is concern for low BP -If hemodynamically unstable would DCCV -For recurrant VT despite patient being on amio infusion, would rebolus again  Signed, Dossie Arbour, MD, Ph.D [facility]

## 2017-03-29 NOTE — ED Notes (Signed)
Patient transported to X-ray 

## 2017-03-30 DIAGNOSIS — K625 Hemorrhage of anus and rectum: Secondary | ICD-10-CM

## 2017-03-30 DIAGNOSIS — Z7983 Long term (current) use of bisphosphonates: Secondary | ICD-10-CM | POA: Diagnosis not present

## 2017-03-30 DIAGNOSIS — I5042 Chronic combined systolic (congestive) and diastolic (congestive) heart failure: Secondary | ICD-10-CM

## 2017-03-30 DIAGNOSIS — E119 Type 2 diabetes mellitus without complications: Secondary | ICD-10-CM | POA: Diagnosis present

## 2017-03-30 DIAGNOSIS — I472 Ventricular tachycardia, unspecified: Secondary | ICD-10-CM

## 2017-03-30 DIAGNOSIS — R748 Abnormal levels of other serum enzymes: Secondary | ICD-10-CM | POA: Diagnosis not present

## 2017-03-30 DIAGNOSIS — Z8674 Personal history of sudden cardiac arrest: Secondary | ICD-10-CM | POA: Diagnosis not present

## 2017-03-30 DIAGNOSIS — Z91041 Radiographic dye allergy status: Secondary | ICD-10-CM | POA: Diagnosis not present

## 2017-03-30 DIAGNOSIS — Z7984 Long term (current) use of oral hypoglycemic drugs: Secondary | ICD-10-CM | POA: Diagnosis not present

## 2017-03-30 DIAGNOSIS — Z7982 Long term (current) use of aspirin: Secondary | ICD-10-CM | POA: Diagnosis not present

## 2017-03-30 DIAGNOSIS — R7989 Other specified abnormal findings of blood chemistry: Secondary | ICD-10-CM

## 2017-03-30 DIAGNOSIS — Z880 Allergy status to penicillin: Secondary | ICD-10-CM | POA: Diagnosis not present

## 2017-03-30 DIAGNOSIS — D62 Acute posthemorrhagic anemia: Secondary | ICD-10-CM | POA: Diagnosis present

## 2017-03-30 DIAGNOSIS — R778 Other specified abnormalities of plasma proteins: Secondary | ICD-10-CM

## 2017-03-30 DIAGNOSIS — I480 Paroxysmal atrial fibrillation: Secondary | ICD-10-CM | POA: Diagnosis present

## 2017-03-30 DIAGNOSIS — Z79899 Other long term (current) drug therapy: Secondary | ICD-10-CM | POA: Diagnosis not present

## 2017-03-30 DIAGNOSIS — I252 Old myocardial infarction: Secondary | ICD-10-CM | POA: Diagnosis not present

## 2017-03-30 DIAGNOSIS — Z95810 Presence of automatic (implantable) cardiac defibrillator: Secondary | ICD-10-CM | POA: Diagnosis not present

## 2017-03-30 DIAGNOSIS — I959 Hypotension, unspecified: Secondary | ICD-10-CM | POA: Diagnosis not present

## 2017-03-30 DIAGNOSIS — I110 Hypertensive heart disease with heart failure: Secondary | ICD-10-CM | POA: Diagnosis present

## 2017-03-30 DIAGNOSIS — Z888 Allergy status to other drugs, medicaments and biological substances status: Secondary | ICD-10-CM | POA: Diagnosis not present

## 2017-03-30 DIAGNOSIS — N401 Enlarged prostate with lower urinary tract symptoms: Secondary | ICD-10-CM | POA: Diagnosis present

## 2017-03-30 DIAGNOSIS — I255 Ischemic cardiomyopathy: Secondary | ICD-10-CM | POA: Diagnosis present

## 2017-03-30 DIAGNOSIS — R001 Bradycardia, unspecified: Secondary | ICD-10-CM | POA: Diagnosis not present

## 2017-03-30 DIAGNOSIS — E785 Hyperlipidemia, unspecified: Secondary | ICD-10-CM | POA: Diagnosis present

## 2017-03-30 DIAGNOSIS — I499 Cardiac arrhythmia, unspecified: Secondary | ICD-10-CM

## 2017-03-30 DIAGNOSIS — K51911 Ulcerative colitis, unspecified with rectal bleeding: Secondary | ICD-10-CM | POA: Diagnosis present

## 2017-03-30 DIAGNOSIS — R338 Other retention of urine: Secondary | ICD-10-CM | POA: Diagnosis present

## 2017-03-30 LAB — BASIC METABOLIC PANEL
ANION GAP: 10 (ref 5–15)
ANION GAP: 11 (ref 5–15)
BUN: 20 mg/dL (ref 6–20)
BUN: 20 mg/dL (ref 6–20)
CHLORIDE: 99 mmol/L — AB (ref 101–111)
CO2: 22 mmol/L (ref 22–32)
CO2: 21 mmol/L — AB (ref 22–32)
CREATININE: 1.16 mg/dL (ref 0.61–1.24)
Calcium: 8 mg/dL — ABNORMAL LOW (ref 8.9–10.3)
Calcium: 8.1 mg/dL — ABNORMAL LOW (ref 8.9–10.3)
Chloride: 101 mmol/L (ref 101–111)
Creatinine, Ser: 1.02 mg/dL (ref 0.61–1.24)
GFR calc non Af Amer: >60 mL/min (ref >60)
GLUCOSE: 213 mg/dL — AB (ref 65–99)
Glucose, Bld: 355 mg/dL — ABNORMAL HIGH (ref 65–99)
POTASSIUM: 4 mmol/L (ref 3.5–5.1)
Potassium: 4.1 mmol/L (ref 3.5–5.1)
SODIUM: 133 mmol/L — AB (ref 135–145)
Sodium: 131 mmol/L — ABNORMAL LOW (ref 135–145)

## 2017-03-30 LAB — PHOSPHORUS
Phosphorus: 1.5 mg/dL — ABNORMAL LOW (ref 2.5–4.6)
Phosphorus: 2.3 mg/dL — ABNORMAL LOW (ref 2.5–4.6)

## 2017-03-30 LAB — GLUCOSE, CAPILLARY
Glucose-Capillary: 206 mg/dL — ABNORMAL HIGH (ref 65–99)
Glucose-Capillary: 174 mg/dL — ABNORMAL HIGH (ref 65–99)
Glucose-Capillary: 206 mg/dL — ABNORMAL HIGH (ref 65–99)
Glucose-Capillary: 209 mg/dL — ABNORMAL HIGH (ref 65–99)
Glucose-Capillary: 201 mg/dL — ABNORMAL HIGH (ref 65–99)

## 2017-03-30 LAB — CBC
HCT: 32.7 % — ABNORMAL LOW (ref 40.0–52.0)
HEMATOCRIT: 31.8 % — AB (ref 40.0–52.0)
HEMOGLOBIN: 11.2 g/dL — AB (ref 13.0–18.0)
HEMOGLOBIN: 10.9 g/dL — AB (ref 13.0–18.0)
MCH: 30.5 pg (ref 26.0–34.0)
MCH: 30.4 pg (ref 26.0–34.0)
MCHC: 34.4 g/dL (ref 32.0–36.0)
MCHC: 34.4 g/dL (ref 32.0–36.0)
MCV: 88.8 fL (ref 80.0–100.0)
MCV: 88.4 fL (ref 80.0–100.0)
Platelets: 141 10*3/uL — ABNORMAL LOW (ref 150–440)
Platelets: 158 10*3/uL (ref 150–440)
RBC: 3.68 MIL/uL — AB (ref 4.40–5.90)
RBC: 3.6 MIL/uL — AB (ref 4.40–5.90)
RDW: 15.5 % — ABNORMAL HIGH (ref 11.5–14.5)
RDW: 15.7 % — ABNORMAL HIGH (ref 11.5–14.5)
WBC: 10.7 10*3/uL — ABNORMAL HIGH (ref 3.8–10.6)
WBC: 11.9 10*3/uL — ABNORMAL HIGH (ref 3.8–10.6)

## 2017-03-30 LAB — HEMOGLOBIN: Hemoglobin: 11.3 g/dL — ABNORMAL LOW (ref 13.0–18.0)

## 2017-03-30 LAB — MRSA PCR SCREENING: MRSA by PCR: NEGATIVE

## 2017-03-30 LAB — TROPONIN I
TROPONIN I: 0.04 ng/mL — AB (ref <0.03)
TROPONIN I: 0.08 ng/mL — AB (ref <0.03)

## 2017-03-30 LAB — MAGNESIUM
Magnesium: 2.4 mg/dL (ref 1.7–2.4)
Magnesium: 2.5 mg/dL — ABNORMAL HIGH (ref 1.7–2.4)

## 2017-03-30 LAB — HIV ANTIBODY (ROUTINE TESTING W REFLEX): HIV SCREEN 4TH GENERATION: NONREACTIVE

## 2017-03-30 MED ORDER — DOPAMINE-DEXTROSE 3.2-5 MG/ML-% IV SOLN
0.0000 ug/kg/min | INTRAVENOUS | Status: DC
Start: 2017-03-30 — End: 2017-03-30

## 2017-03-30 MED ORDER — DOPAMINE-DEXTROSE 3.2-5 MG/ML-% IV SOLN
INTRAVENOUS | Status: AC
  Filled 2017-03-30: qty 250

## 2017-03-30 MED ORDER — INSULIN ASPART 100 UNIT/ML ~~LOC~~ SOLN: 0.0000 [IU] | SUBCUTANEOUS | Status: DC

## 2017-03-30 MED ORDER — ORAL CARE MOUTH RINSE: 15.0000 mL | Freq: Two times a day (BID) | OROMUCOSAL | Status: DC

## 2017-03-30 MED ORDER — K PHOS MONO-SOD PHOS DI & MONO 155-852-130 MG PO TABS
1000.0000 mg | ORAL_TABLET | Freq: Once | ORAL | Status: AC
  Administered 2017-03-30: 1000 mg via ORAL
  Filled 2017-03-30: qty 4

## 2017-03-30 MED ORDER — K PHOS MONO-SOD PHOS DI & MONO 155-852-130 MG PO TABS
500.0000 mg | ORAL_TABLET | Freq: Once | ORAL | Status: AC
  Administered 2017-03-30: 500 mg via ORAL
  Filled 2017-03-30: qty 2

## 2017-03-30 NOTE — Progress Notes (Signed)
Patient ID: Derek Barnes, male   DOB: February 04, 1951, 67 y.o.   MRN: [id number]  [employer] PROGRESS NOTE  Derek Barnes ZHY:[id number] DOB: Feb 13, 1951 DOA: 03/29/2017 PCP: Joanna Hews, MD  HPI/Subjective: Patient feels a lot better.  At home he had 2 episodes where he lost a lot of blood.  Last night had to be transferred to the ICU for wide-complex tachycardia.  Objective: Vitals:   03/30/17 1210 03/30/17 1300  BP:  (!) 93/59  Pulse: 68 75  Resp:  15  Temp:    SpO2:  100%    Filed Weights   03/29/17 0849 03/29/17 2300  Weight: 88 kg (194 lb) 90.4 kg (199 lb 4.7 oz)    ROS: Review of Systems  Constitutional: Negative for chills and fever.  Eyes: Negative for blurred vision.  Respiratory: Negative for cough and shortness of breath.   Cardiovascular: Negative for chest pain.  Gastrointestinal: Positive for blood in stool. Negative for abdominal pain, constipation, diarrhea, nausea and vomiting.  Genitourinary: Negative for dysuria.  Musculoskeletal: Negative for joint pain.  Neurological: Negative for dizziness and headaches.   Exam: Physical Exam  Constitutional: He is oriented to person, place, and time.  HENT:  Nose: No mucosal edema.  Mouth/Throat: No oropharyngeal exudate or posterior oropharyngeal edema.  Eyes: Conjunctivae, EOM and lids are normal. Pupils are equal, round, and reactive to light.  Neck: No JVD present. Carotid bruit is not present. No edema present. No thyroid mass and no thyromegaly present.  Cardiovascular: S1 normal and S2 normal. Exam reveals no gallop.  No murmur heard. Pulses:      Dorsalis pedis pulses are 2+ on the right side, and 2+ on the left side.  Respiratory: No respiratory distress. He has no wheezes. He has no rhonchi. He has no rales.  GI: Soft. Bowel sounds are normal. There is no tenderness.  Musculoskeletal:       Right ankle: He exhibits swelling.       Left ankle: He exhibits swelling.  Lymphadenopathy:    He has no  cervical adenopathy.  Neurological: He is alert and oriented to person, place, and time. No cranial nerve deficit.  Skin: Skin is warm. No rash noted. Nails show no clubbing.  Psychiatric: He has a normal mood and affect.      Data Reviewed: Basic Metabolic Panel: Recent Labs  Lab 03/29/17 0550 03/29/17 1907 03/30/17 0001 03/30/17 0448  NA 137  --  131* 133*  K 3.8 3.6 4.0 4.1  CL 102  --  99* 101  CO2 24  --  21* 22  GLUCOSE 191*  --  355* 213*  BUN 18  --  20 20  CREATININE 1.18  --  1.16 1.02  CALCIUM 8.4*  --  8.0* 8.1*  MG  --  1.5* 2.5* 2.4  PHOS  --   --  1.5* 2.3*   CBC: Recent Labs  Lab 03/29/17 0550 03/29/17 0920 03/29/17 1508 03/30/17 0001 03/30/17 0448 03/30/17 1304  WBC 9.2 10.1  --  10.7* 11.9*  --   NEUTROABS 7.8*  --   --   --   --   --   HGB 12.5* 11.3* 11.6* 10.9* 11.2* 11.3*  HCT 36.6* 33.5*  --  31.8* 32.7*  --   MCV 88.8 89.3  --  88.4 88.8  --   PLT 178 168  --  141* 158  --    Cardiac Enzymes: Recent Labs  Lab 03/29/17 0920  03/29/17 1235 03/29/17 1806 03/30/17 0001 03/30/17 0448  TROPONINI 0.07* 0.07* 0.06* 0.08* 0.04*    CBG: Recent Labs  Lab 03/29/17 2240 03/30/17 0408 03/30/17 0742 03/30/17 1129  GLUCAP 283* 206* 174* 206*    Recent Results (from the past 240 hour(s))  MRSA PCR Screening     Status: None   Collection Time: 03/29/17 11:04 PM  Result Value Ref Range Status   MRSA by PCR NEGATIVE NEGATIVE Final    Comment:        The GeneXpert MRSA Assay (FDA approved for NASAL specimens only), is one component of a comprehensive MRSA colonization surveillance program. It is not intended to diagnose MRSA infection nor to guide or monitor treatment for MRSA infections. Performed at [facility], [address] [place], [place] [address]      Studies: Dg Chest 2 View  Result Date: 03/29/2017 CLINICAL DATA:  Initial evaluation for acute chest pain, shortness of breath. EXAM: CHEST  2 VIEW COMPARISON:   Prior radiograph from 03/13/2015. FINDINGS: Moderate cardiomegaly, stable. Left-sided pacemaker/AICD. Mediastinal silhouette normal. Lungs normally inflated. Perihilar vascular congestion without pulmonary edema. No pleural effusion. No focal infiltrates. No pneumothorax. No acute osseus abnormality. IMPRESSION: 1. Stable cardiomegaly with mild perihilar vascular congestion without overt pulmonary edema. 2. No other active cardiopulmonary disease. Electronically Signed   By: Rise Mu M.D.   On: 03/29/2017 06:33   Dg Chest Port 1 View  Result Date: 03/29/2017 CLINICAL DATA:  Arrythmia. EXAM: PORTABLE CHEST 1 VIEW COMPARISON:  Frontal and lateral views earlier this day. FINDINGS: Multilead left-sided pacemaker remains in place. Cardiomegaly is unchanged. Improved vascular congestion from exam earlier this day. No pulmonary edema. No developing airspace consolidation. No pleural fluid or pneumothorax. Surgical anchor overlies the right proximal humerus. IMPRESSION: Stable cardiomegaly. Improving vascular congestion. No new abnormality. Electronically Signed   By: Rubye Oaks M.D.   On: 03/29/2017 23:09    Scheduled Meds: . amiodarone  200 mg Oral BID  . atorvastatin  40 mg Oral Daily  . budesonide  9 mg Oral Daily  . bumetanide  1 mg Oral Daily  . digoxin  125 mcg Oral Daily  . insulin aspart  0-15 Units Subcutaneous Q4H  . isosorbide mononitrate  30 mg Oral Daily  . mouth rinse  15 mL Mouth Rinse BID  . mesalamine  2.4 g Oral Q breakfast  . metFORMIN  500 mg Oral BID WC  . metoprolol succinate  50 mg Oral Daily  . [START ON 04/04/2017] predniSONE  20 mg Oral QAC breakfast  . sacubitril-valsartan  1 tablet Oral BID  . tamsulosin  0.4 mg Oral Daily   Continuous Infusions: . DOPamine      Assessment/Plan:  1. Ventricular tachycardia.  Patient is on amiodarone.  Settings on defibrillator were adjusted by [employer]. 2. Severe cardiomyopathy with a low ejection fraction.  Patient  on Entresto, metoprolol 3. Rectal bleeding likely from ulcerative colitis on prednisone and Entocort and mesalamine.  Recommend checking a hemoglobin tomorrow 4. Type 2 diabetes on metformin and sliding scale 5. Chronic systolic congestive heart failure.  On Entresto and metoprolol.  Seems compensated at this point.  Code Status:     Code Status Orders  (From admission, onward)        Start     Ordered   03/29/17 1411  Full code  Continuous     03/29/17 1410    Code Status History    Date Active Date Inactive Code Status  Order ID Comments User Context   This patient has a current code status but no historical code status.    Advance Directive Documentation     Most Recent Value  Type of Advance Directive  Living will  Pre-existing out of facility DNR order (yellow form or pink MOST form)  No data  "MOST" Form in Place?  No data     Family Communication: As per critical care specialist Disposition Plan: Home in a day or so  Consultants: -Cardiology -Critical care team  Time spent: 25 minutes  Erielle Gawronski [employer]

## 2017-03-30 NOTE — Plan of Care (Signed)
Patient alert and able to make needs known.  Refused insulin this shift. Last blood sugar at 4:00 am 207.  No coverage given.  Patient continues with irregular heart rate. Heart rate registering on monitor at 24-78.  Apical and radial HR is 68 and 70, respectively. Patient has no c/o chest pain or discomfort. Cardiology will check ICD today for malfunction.  Will continue to monitor.  NP, Maggie aware.

## 2017-03-30 NOTE — Progress Notes (Signed)
Patient had multiple runs of bradycardia.  NP, Maggie notified.  Dopamine ordered.  Patient continues to be alert and oriented.  Patient awake. HR 24-31 and then increasing to 67-78.  Patient has no c/o pain or discomfort at this time.

## 2017-03-30 NOTE — Significant Event (Signed)
Rapid Response Event Note  Overview: Time Called: 2207 Arrival Time: 2209 Event Type: Cardiac  Initial Focused Assessment: Pt. Had increased WOB, sitting up in bed- pt. Denied chest pain but stated that is chest "felt tight" and breathing was hard.   Pt. Had 18 beat run of VT @ 2043, and then went back into VT from 2204 till 2211. 2215: Pt. Has AICD, BP: 98/63, HR: 102 Care RN on phone with pt.'s cardiologist- who was concerned this could be an issue with the pt.'s AICD. Pt. Stated he had the AICD recently checked and it was working.  Interventions: Pt. Was placed on 2 L [place], care nurse had just finished 4g of Mag infusion, pt.'s K+ had been previously replaced. Dr. Caryn Bee called, discussion about an amiodarone bolus which was eventually discontinued due to pt.'s BP. MD decided to initiate a transfer to ICU as SD status. Pt. Transported on zoll monitor with [facility], care RN and Rapid response RN.  Plan of Care (if not transferred):  Event Summary: Name of Physician Notified: Dr. Caryn Bee at 2210  Name of Consulting Physician Notified: Dr. Deno Lunger, cardiologist at [facility] at 2209  Outcome: Transferred (Comment)(transferred to ICU as SD pt.)  Event End Time: 2228  Cheryll Cockayne [place]

## 2017-03-30 NOTE — Progress Notes (Signed)
Dopamine on hold at this time per Cardiologist (Dr. Mariah Milling).  Patient refused insulin injection.  Patient aware of concerns of elevated blood sugar, however, continues to decline insulin. Patient continues with irregular heart rate with heart rate ranging anywhere from 28 to 76.  Patient continues to deny chest pain, shortness of breath.  Will continue to monitor.

## 2017-03-30 NOTE — Consult Note (Signed)
Cardiology Consultation:   Patient ID: ROME ECHAVARRIA; [id number]; 1950/06/27   Admit date: 03/29/2017 Date of Consult: 03/30/2017  Primary Care Provider: Joanna Hews, MD Primary Cardiologist: Dr. Devonne Barnes [facility])   Patient Profile:   Derek Barnes is a 67 y.o. male with a hx of chronic systolic and diastolic heart failure (NICM, LVEF 5-10%, status post ICD, prior cardiac arrest, paroxysmal atrial fibrillation, hypertension, and ulcerative colitis who is being seen today for the evaluation of ventricular tachycardia at the request of Dr. Jene Barnes.  History of Present Illness:   Derek Barnes was admitted 03/29/16 with GI bleed.  He was hospitalized the prior week for urinary retention and was started on Flomax.  He was supposed to follow-up in urology clinic on 03/29/17 and had his Foley catheter removed.  However, he started to develop hematochezia.  This has been ongoing for the preceding 2 weeks but became worse on the day prior to admission.  He was evaluated by GI and started on prednisone and mesalamine.  His rectal bleeding was thought to be due to ulcerative colitis.  Overnight he had an episode of VT at a rate of 160 bpm.  He felt short of breath but was otherwise asymptomatic.  The episode lasted for 6 minutes and his device did not fire.  Cardiology was consulted.  Potassium was 3.6 and magnesium was 1.5.  Overall he had been stable from a heart failure standpoint.  He had not been experiencing lower extremity edema, orthopnea, PND, or shortness of breath.  Troponin was mildly elevated to 0.08 and has been relatively flat.  EKG reveals ventricular pacing.  Derek Barnes is followed by Dr. Devonne Barnes at [facility].  He reportedly had issues with by V lead capture that have been addressed by EP.  His Medtronic by V ICD was changed out on 04/04/16.  Derek Barnes has struggled with volume overload and has had several titrations of his diuretic regimen.  He is previously had inappropriate ICD firing  for atrial fibrillation with rapid ventricular response.  His device was interrogated today and showed no VT or VF since implantation of the device with the exception of January 3 and March 29, 2017.  His device was functioning properly.   Past Medical History:  Diagnosis Date  . CHF (congestive heart failure) (HCC)   . Chronic kidney disease (CKD)   . Colitis   . Decreased cardiac ejection fraction    EF 5-10%  . Dysrhythmia   . Hypertension   . Liver abscess   . Myocardial infarct (HCC)   . Nonischemic cardiomyopathy [facility])     Past Surgical History:  Procedure Laterality Date  . ACD DEFIBRILLATOR    . COLONOSCOPY    . COLONOSCOPY WITH PROPOFOL N/A 01/23/2017   Procedure: COLONOSCOPY WITH PROPOFOL;  Surgeon: Toledo, Boykin Nearing, MD;  Location: [facility] ENDOSCOPY;  Service: Gastroenterology;  Laterality: N/A;  . FRACTURE SURGERY    . HERNIA REPAIR    . INSERT / REPLACE / REMOVE PACEMAKER    . KNEE ARTHROSCOPY    . SHOULDER ARTHROSCOPY       Home Medications:  Prior to Admission medications   Medication Sig Start Date End Date Taking? Authorizing Provider  amiodarone (PACERONE) 200 MG tablet Take 200 mg by mouth 2 (two) times daily.   Yes [provider]  aspirin EC 81 MG tablet Take 81 mg by mouth daily.   Yes [provider]  atorvastatin (LIPITOR) 40 MG tablet Take  40 mg by mouth daily.   Yes [provider]  Budesonide ER 9 MG TB24 Take 1 tablet by mouth daily.   Yes [provider]  bumetanide (BUMEX) 1 MG tablet Take 1 mg by mouth daily.   Yes [provider]  digoxin (LANOXIN) 0.125 MG tablet Take 1 tablet by mouth daily.    Yes [provider]  eplerenone (INSPRA) 50 MG tablet Take 50 mg by mouth daily.   Yes [provider]  isosorbide mononitrate (IMDUR) 30 MG 24 hr tablet Take 30 mg by mouth daily.   Yes [provider]  metFORMIN (GLUCOPHAGE) 500 MG tablet Take by mouth 2 (two) times daily with a  meal.   Yes [provider]  metoprolol succinate (TOPROL-XL) 50 MG 24 hr tablet Take 50 mg by mouth daily. Take with or immediately following a meal.   Yes [provider]  nitroGLYCERIN (NITROSTAT) 0.4 MG SL tablet Place 1 tablet under the tongue Barnes 5 (five) minutes as needed. 01/19/17  Yes [provider]  sacubitril-valsartan (ENTRESTO) 24-26 MG Take 1 tablet by mouth 2 (two) times daily.   Yes [provider]  tamsulosin (FLOMAX) 0.4 MG CAPS capsule Take 1 capsule (0.4 mg total) by mouth daily. 03/19/17  Yes Myrna Blazer, MD    Inpatient Medications: Scheduled Meds: . amiodarone  200 mg Oral BID  . atorvastatin  40 mg Oral Daily  . budesonide  9 mg Oral Daily  . bumetanide  1 mg Oral Daily  . digoxin  125 mcg Oral Daily  . insulin aspart  0-15 Units Subcutaneous Q4H  . isosorbide mononitrate  30 mg Oral Daily  . mesalamine  2.4 g Oral Q breakfast  . metFORMIN  500 mg Oral BID WC  . metoprolol succinate  50 mg Oral Daily  . [START ON 04/04/2017] predniSONE  20 mg Oral QAC breakfast  . sacubitril-valsartan  1 tablet Oral BID  . tamsulosin  0.4 mg Oral Daily   Continuous Infusions: . DOPamine    . DOPamine     PRN Meds: acetaminophen **OR** acetaminophen, metoprolol tartrate, nitroGLYCERIN, ondansetron **OR** ondansetron (ZOFRAN) IV  Allergies:    Allergies  Allergen Reactions  . Contrast Media [Iodinated Diagnostic Agents] Anaphylaxis  . Penicillins Anaphylaxis    Has patient had a PCN reaction causing immediate rash, facial/tongue/throat swelling, SOB or lightheadedness with hypotension: Yes Has patient had a PCN reaction causing severe rash involving mucus membranes or skin necrosis: Yes Has patient had a PCN reaction that required hospitalization: No Has patient had a PCN reaction occurring within the last 10 years: No If all of the above answers are "NO", then may proceed with Cephalosporin use.   . Sotalol Other (See  Comments)    Excessive fatigue    Social History:   Social History   Socioeconomic History  . Marital status: Divorced    Spouse name: Not on file  . Number of children: Not on file  . Years of education: Not on file  . Highest education level: Not on file  Social Needs  . Financial resource strain: Not on file  . Food insecurity - worry: Not on file  . Food insecurity - inability: Not on file  . Transportation needs - medical: Not on file  . Transportation needs - non-medical: Not on file  Occupational History  . Not on file  Tobacco Use  . Smoking status: Never Smoker  . Smokeless tobacco: Never Used  Substance  and Sexual Activity  . Alcohol use: No  . Drug use: No  . Sexual activity: Not Currently  Other Topics Concern  . Not on file  Social History Narrative   Independent and active at baseline.    Family History:    Family History  Problem Relation Age of Onset  . Cardiomyopathy Mother   . Prostate cancer Neg Hx   . Bladder Cancer Neg Hx   . Kidney cancer Neg Hx      ROS:  Please see the history of present illness.  ROS  All other ROS reviewed and negative.     Physical Exam/Data:   Vitals:   03/30/17 0300 03/30/17 0400 03/30/17 0500 03/30/17 0600  BP: (!) 87/39 99/61 (!) 90/56 (!) 100/58  Pulse: 77 76 72 69  Resp: 18 (!) 6 16 (!) 5  Temp:      TempSrc:      SpO2: 99% 100% 100% 99%  Weight:      Height:        Intake/Output Summary (Last 24 hours) at 03/30/2017 0850 Last data filed at 03/29/2017 2345 Gross per 24 hour  Intake 160 ml  Output 600 ml  Net -440 ml   Filed Weights   03/29/17 0849 03/29/17 2300  Weight: 194 lb (88 kg) 199 lb 4.7 oz (90.4 kg)   Body mass index is 30.3 kg/m.   VS:  BP 101/68   Pulse 67   Temp 98.4 F (36.9 C) (Axillary)   Resp 12   Ht 5\' 8"  (1.727 m)   Wt 199 lb 4.7 oz (90.4 kg)   SpO2 99%   BMI 30.30 kg/m  , BMI Body mass index is 30.3 kg/m. GENERAL:  Well appearing HEENT: Pupils equal round and  reactive, fundi not visualized, oral mucosa unremarkable NECK:  No jugular venous distention, waveform within normal limits, carotid upstroke brisk and symmetric, no bruits, no thyromegaly LYMPHATICS:  No cervical adenopathy LUNGS:  Clear to auscultation bilaterally HEART:  RRR.  PMI not displaced or sustained,S1 and S2 within normal limits, no S3, no S4, no clicks, no rubs, no murmurs ABD:  Flat, positive bowel sounds normal in frequency in pitch, no bruits, no rebound, no guarding, no midline pulsatile mass, no hepatomegaly, no splenomegaly EXT:  2 plus pulses throughout, no edema, no cyanosis no clubbing SKIN:  No rashes no nodules NEURO:  Cranial nerves II through XII grossly intact, motor grossly intact throughout PSYCH:  Cognitively intact, oriented to person place and time   EKG:  The EKG was personally reviewed and demonstrates:  VP Telemetry:  Telemetry was personally reviewed and demonstrates:  VP.  PVCs.  Monomorphic VT at 160 bpm.    Relevant CV Studies:  Echo 10/14/15:  Technically difficult study due to chest wall/lung interference  Dilated left ventricle - severe  Severely decreased left ventricular systolic function, ejection fraction  10 to 15%  Degenerative mitral valve disease  Mitral regurgitation - mild to moderate  Dilated left atrium - mild  Normal right ventricular systolic function  Laboratory Data:  Chemistry Recent Labs  Lab 03/29/17 0550 03/29/17 1907 03/30/17 0001 03/30/17 0448  NA 137  --  131* 133*  K 3.8 3.6 4.0 4.1  CL 102  --  99* 101  CO2 24  --  21* 22  GLUCOSE 191*  --  355* 213*  BUN 18  --  20 20  CREATININE 1.18  --  1.16 1.02  CALCIUM 8.4*  --  8.0* 8.1*  GFRNONAA >60  --  >60 >60  GFRAA >60  --  >60 >60  ANIONGAP 11  --  11 10    No results for input(s): PROT, ALBUMIN, AST, ALT, ALKPHOS, BILITOT in the last 168 hours. Hematology Recent Labs  Lab 03/29/17 0920 03/29/17 1508 03/30/17 0001 03/30/17 0448  WBC 10.1  --   10.7* 11.9*  RBC 3.75*  --  3.60* 3.68*  HGB 11.3* 11.6* 10.9* 11.2*  HCT 33.5*  --  31.8* 32.7*  MCV 89.3  --  88.4 88.8  MCH 30.0  --  30.4 30.5  MCHC 33.6  --  34.4 34.4  RDW 16.1*  --  15.7* 15.5*  PLT 168  --  141* 158   Cardiac Enzymes Recent Labs  Lab 03/29/17 0550 03/29/17 0920 03/29/17 1235 03/29/17 1806 03/30/17 0001 03/30/17 0448  TROPONINI 0.06* 0.07* 0.07* 0.06* 0.08* 0.04*   No results for input(s): TROPIPOC in the last 168 hours.  BNPNo results for input(s): BNP, PROBNP in the last 168 hours.  DDimer No results for input(s): DDIMER in the last 168 hours.  Radiology/Studies:  Dg Chest 2 View  Result Date: 03/29/2017 CLINICAL DATA:  Initial evaluation for acute chest pain, shortness of breath. EXAM: CHEST  2 VIEW COMPARISON:  Prior radiograph from 03/13/2015. FINDINGS: Moderate cardiomegaly, stable. Left-sided pacemaker/AICD. Mediastinal silhouette normal. Lungs normally inflated. Perihilar vascular congestion without pulmonary edema. No pleural effusion. No focal infiltrates. No pneumothorax. No acute osseus abnormality. IMPRESSION: 1. Stable cardiomegaly with mild perihilar vascular congestion without overt pulmonary edema. 2. No other active cardiopulmonary disease. Electronically Signed   By: Rise Mu M.D.   On: 03/29/2017 06:33   Dg Chest Port 1 View  Result Date: 03/29/2017 CLINICAL DATA:  Arrythmia. EXAM: PORTABLE CHEST 1 VIEW COMPARISON:  Frontal and lateral views earlier this day. FINDINGS: Multilead left-sided pacemaker remains in place. Cardiomegaly is unchanged. Improved vascular congestion from exam earlier this day. No pulmonary edema. No developing airspace consolidation. No pleural fluid or pneumothorax. Surgical anchor overlies the right proximal humerus. IMPRESSION: Stable cardiomegaly. Improving vascular congestion. No new abnormality. Electronically Signed   By: Rubye Oaks M.D.   On: 03/29/2017 23:09    Assessment and Plan:   #  Ventricular tachycardia:  Sustained event lasting 6 minutes and 4 minutes.  He also had an event on 1/3 that lasted for 3 minutes.  His ventricular rate is in the 160s.  Therefore, no therapy was applied.  His current device settings are set to monitor only unless his heart rate reaches 188 bpm and ventricular tachycardia.  This is likely due to his history of inappropriate shocks for atrial fibrillation.  He is already receiving amiodarone 400 mg daily.  He had been relatively quiet until now.  Magnesium was mildly abnormal, though I doubt that this was the cause of his ventricular arrhythmia.  He seems euvolemic from a heart failure standpoint.  I suspect that his GI bleed is what caused his instability.  We have discussed this with Dr. Graciela Husbands of EP.  He recommends changing his therapies to 3 zones, each with ATP and shocking.  Therefore, if he has another episode of ventricular tachycardia in the 160s he will receive therapies.  I would avoid the dopamine that was previously ordered due to concern that this could exacerbate arrhythmias.  If he needs blood pressure support would use milrinone instead.  Continue amiodarone, digoxin, and metoprolol.  # Chronic systolic and diastolic heart failure:  Currently euvolemic.  Optive all levels are good. Continue digoxin, Imdur, metoprolol, and Entresto.  We will hold the dose of bumex given his GI bleed.  AM dose of Entresto was held. Will resume if SBP >100.   # Hyperlipidemia: Continue atorvastatin.     For questions or updates, please contact [facility] Please consult [email or web address] for contact info under Cardiology/STEMI.   Signed, Chilton Si, MD  03/30/2017 8:50 AM

## 2017-03-31 ENCOUNTER — Encounter: Payer: Self-pay | Admitting: *Deleted

## 2017-03-31 MED ORDER — PREDNISONE 20 MG PO TABS
20.0000 mg | ORAL_TABLET | Freq: Every day | ORAL | Status: DC
  Administered 2017-03-31: 20 mg via ORAL
  Filled 2017-03-31: qty 1

## 2017-03-31 MED ORDER — MESALAMINE 1.2 G PO TBEC: 2.4000 g | DELAYED_RELEASE_TABLET | Freq: Every day | ORAL | 0 refills | Status: AC

## 2017-03-31 MED ORDER — INSULIN ASPART 100 UNIT/ML ~~LOC~~ SOLN: 0.0000 [IU] | Freq: Every day | SUBCUTANEOUS | Status: DC

## 2017-03-31 MED ORDER — BUDESONIDE ER 9 MG PO TB24: 1.0000 | ORAL_TABLET | Freq: Every day | ORAL | 0 refills | Status: AC

## 2017-03-31 MED ORDER — INSULIN ASPART 100 UNIT/ML ~~LOC~~ SOLN: 0.0000 [IU] | Freq: Three times a day (TID) | SUBCUTANEOUS | Status: DC

## 2017-03-31 NOTE — Progress Notes (Signed)
Discharged to home per MD order.  Vitals stable.  Ambulated around unit with no issues.  2 PIVs removed.  Discharged via wheelchair by [place], [place].

## 2017-03-31 NOTE — Discharge Summary (Signed)
[facility] - [place] [facility]   PATIENT NAME: Derek Barnes    MR#:  [id number]  DATE OF BIRTH:  [birth year]  DATE OF ADMISSION:  03/29/2017 ADMITTING PHYSICIAN: Enid Baas, MD  DATE OF DISCHARGE: 03/31/2017  PRIMARY CARE PHYSICIAN: Joanna Hews, MD    ADMISSION DIAGNOSIS:  Elevated troponin [R74.8] Gastrointestinal hemorrhage, unspecified gastrointestinal hemorrhage type [K92.2] Chest pain, unspecified type [R07.9]  DISCHARGE DIAGNOSIS:  Active Problems:   Rectal bleed   Arrhythmia   Elevated troponin   Chronic combined systolic and diastolic heart failure (HCC)   Ventricular tachycardia (HCC)   SECONDARY DIAGNOSIS:   Past Medical History:  Diagnosis Date  . CHF (congestive heart failure) (HCC)   . Chronic kidney disease (CKD)   . Colitis   . Decreased cardiac ejection fraction    EF 5-10%  . Dysrhythmia   . Hypertension   . Liver abscess   . Myocardial infarct (HCC)   . Nonischemic cardiomyopathy (HCC)     HOSPITAL COURSE:   1.  Rectal bleeding likely from ulcerative colitis.  The patient was on prednisone here in the hospital but only low-dose.  I spoke with covering gastroenterologist on call and he was on only low-dose prednisone of 20 mg.  He can go back on Entocort as outpatient.  Mesalamine was also prescribed.  Follow-up with gastroenterology as outpatient.  Hemoglobin was stable during hospital course.  Prescriptions for Entocort and mesalamine written. 2.  Ventricular tachycardia.  The patient was transferred to the ICU for amiodarone drip.  The patient did have bradycardia after amiodarone drip and was placed back on oral amiodarone.  Cardiology asked [employer] to reset his defibrillator to sense this and shocked him if this happens.  He had no further ventricular tachycardia after this. 3.  Severe cardiomyopathy with low ejection fraction.  Patient on Entresto and metoprolol 4.  Type 2 diabetes mellitus.  Can go back on  metformin 5.  Chronic systolic congestive heart failure on Entresto and metoprolol.  Patient is compensated at this point.  This was not his reason for coming into the hospital. 6.  Recent urinary retention secondary to BPH.  Foley removed.  Patient urinating well.  On Flomax.  DISCHARGE CONDITIONS:   Satisfactory  CONSULTS OBTAINED:  Treatment Team:  [name], MD Antonieta Iba, MD  DRUG ALLERGIES:   Allergies  Allergen Reactions  . Contrast Media [Iodinated Diagnostic Agents] Anaphylaxis  . Penicillins Anaphylaxis    Has patient had a PCN reaction causing immediate rash, facial/tongue/throat swelling, SOB or lightheadedness with hypotension: Yes Has patient had a PCN reaction causing severe rash involving mucus membranes or skin necrosis: Yes Has patient had a PCN reaction that required hospitalization: No Has patient had a PCN reaction occurring within the last 10 years: No If all of the above answers are "NO", then may proceed with Cephalosporin use.   . Sotalol Other (See Comments)    Excessive fatigue    DISCHARGE MEDICATIONS:   Allergies as of 03/31/2017      Reactions   Contrast Media [iodinated Diagnostic Agents] Anaphylaxis   Penicillins Anaphylaxis   Has patient had a PCN reaction causing immediate rash, facial/tongue/throat swelling, SOB or lightheadedness with hypotension: Yes Has patient had a PCN reaction causing severe rash involving mucus membranes or skin necrosis: Yes Has patient had a PCN reaction that required hospitalization: No Has patient had a PCN reaction occurring within the last 10 years: No If all of the above answers  are "NO", then may proceed with Cephalosporin use.   Sotalol Other (See Comments)   Excessive fatigue      Medication List    STOP taking these medications   aspirin EC 81 MG tablet     TAKE these medications   amiodarone 200 MG tablet Commonly known as:  PACERONE Take 200 mg by mouth 2 (two) times daily.    atorvastatin 40 MG tablet Commonly known as:  LIPITOR Take 40 mg by mouth daily.   Budesonide ER 9 MG Tb24 Take 1 tablet by mouth daily.   bumetanide 1 MG tablet Commonly known as:  BUMEX Take 1 mg by mouth daily.   digoxin 0.125 MG tablet Commonly known as:  LANOXIN Take 1 tablet by mouth daily.   ENTRESTO 24-26 MG Generic drug:  sacubitril-valsartan Take 1 tablet by mouth 2 (two) times daily.   eplerenone 50 MG tablet Commonly known as:  INSPRA Take 50 mg by mouth daily.   isosorbide mononitrate 30 MG 24 hr tablet Commonly known as:  IMDUR Take 30 mg by mouth daily.   mesalamine 1.2 g EC tablet Commonly known as:  LIALDA Take 2 tablets (2.4 g total) by mouth daily with breakfast. Start taking on:  04/01/2017   metFORMIN 500 MG tablet Commonly known as:  GLUCOPHAGE Take by mouth 2 (two) times daily with a meal.   metoprolol succinate 50 MG 24 hr tablet Commonly known as:  TOPROL-XL Take 50 mg by mouth daily. Take with or immediately following a meal.   nitroGLYCERIN 0.4 MG SL tablet Commonly known as:  NITROSTAT Place 1 tablet under the tongue every 5 (five) minutes as needed.   tamsulosin 0.4 MG Caps capsule Commonly known as:  FLOMAX Take 1 capsule (0.4 mg total) by mouth daily.        DISCHARGE INSTRUCTIONS:   Follow-up PMD 1 week  If you experience worsening of your admission symptoms, develop shortness of breath, life threatening emergency, suicidal or homicidal thoughts you must seek medical attention immediately by calling 911 or calling your MD immediately  if symptoms less severe.  You Must read complete instructions/literature along with all the possible adverse reactions/side effects for all the Medicines you take and that have been prescribed to you. Take any new Medicines after you have completely understood and accept all the possible adverse reactions/side effects.   Please note  You were cared for by a hospitalist during your hospital  stay. If you have any questions about your discharge medications or the care you received while you were in the hospital after you are discharged, you can call the unit and asked to speak with the hospitalist on call if the hospitalist that took care of you is not available. Once you are discharged, your primary care physician will handle any further medical issues. Please note that NO REFILLS for any discharge medications will be authorized once you are discharged, as it is imperative that you return to your primary care physician (or establish a relationship with a primary care physician if you do not have one) for your aftercare needs so that they can reassess your need for medications and monitor your lab values.    Today   CHIEF COMPLAINT:   Chief Complaint  Patient presents with  . Urinary Retention    HISTORY OF PRESENT ILLNESS:  Beatriz Settles  is a 67 y.o. male came in with rectal bleeding  VITAL SIGNS:  Blood pressure 122/68, pulse 76, temperature 98 F (36.7  C), temperature source Oral, resp. rate 16, height 5\' 8"  (1.727 m), weight 90.4 kg (199 lb 4.7 oz), SpO2 100 %.    PHYSICAL EXAMINATION:  GENERAL:  67 y.o.-year-old patient lying in the bed with no acute distress.  EYES: Pupils equal, round, reactive to light and accommodation. No scleral icterus. Extraocular muscles intact.  HEENT: Head atraumatic, normocephalic. Oropharynx and nasopharynx clear.  NECK:  Supple, no jugular venous distention. No thyroid enlargement, no tenderness.  LUNGS: Normal breath sounds bilaterally, no wheezing, rales,rhonchi or crepitation. No use of accessory muscles of respiration.  CARDIOVASCULAR: S1, S2 normal. No murmurs, rubs, or gallops.  ABDOMEN: Soft, non-tender, non-distended. Bowel sounds present. No organomegaly or mass.  EXTREMITIES: No pedal edema, cyanosis, or clubbing.  NEUROLOGIC: Cranial nerves II through XII are intact. Muscle strength 5/5 in all extremities. Sensation intact.  Gait not checked.  PSYCHIATRIC: The patient is alert and oriented x 3.  SKIN: No obvious rash, lesion, or ulcer.   DATA REVIEW:   CBC Recent Labs  Lab 03/30/17 0448 03/30/17 1304  WBC 11.9*  --   HGB 11.2* 11.3*  HCT 32.7*  --   PLT 158  --     Chemistries  Recent Labs  Lab 03/30/17 0448  NA 133*  K 4.1  CL 101  CO2 22  GLUCOSE 213*  BUN 20  CREATININE 1.02  CALCIUM 8.1*  MG 2.4    Cardiac Enzymes Recent Labs  Lab 03/30/17 0448  TROPONINI 0.04*    Microbiology Results  Results for orders placed or performed during the hospital encounter of 03/29/17  MRSA PCR Screening     Status: None   Collection Time: 03/29/17 11:04 PM  Result Value Ref Range Status   MRSA by PCR NEGATIVE NEGATIVE Final    Comment:        The GeneXpert MRSA Assay (FDA approved for NASAL specimens only), is one component of a comprehensive MRSA colonization surveillance program. It is not intended to diagnose MRSA infection nor to guide or monitor treatment for MRSA infections. Performed at [facility], [address] [place], [place] [address]     RADIOLOGY:  Dg Chest Port 1 View  Result Date: 03/29/2017 CLINICAL DATA:  Arrythmia. EXAM: PORTABLE CHEST 1 VIEW COMPARISON:  Frontal and lateral views earlier this day. FINDINGS: Multilead left-sided pacemaker remains in place. Cardiomegaly is unchanged. Improved vascular congestion from exam earlier this day. No pulmonary edema. No developing airspace consolidation. No pleural fluid or pneumothorax. Surgical anchor overlies the right proximal humerus. IMPRESSION: Stable cardiomegaly. Improving vascular congestion. No new abnormality. Electronically Signed   By: Rubye Oaks M.D.   On: 03/29/2017 23:09       Management plans discussed with the patient, and he is in agreement.  CODE STATUS:     Code Status Orders  (From admission, onward)        Start     Ordered   03/29/17 1411  Full code  Continuous      03/29/17 1410    Code Status History    Date Active Date Inactive Code Status Order ID Comments User Context   This patient has a current code status but no historical code status.    Advance Directive Documentation     Most Recent Value  Type of Advance Directive  Living will  Pre-existing out of facility DNR order (yellow form or pink MOST form)  No data  "MOST" Form in Place?  No data  TOTAL TIME TAKING CARE OF THIS PATIENT: 35  minutes.    Alford Highland M.D on 03/31/2017 at 3:07 PM  Between 7am to 6pm - Pager - [phone]  After 6pm go to [email or web address] - password [email or web address]  [employer] Office  [phone]  CC: Primary care physician; Joanna Hews, MD

## 2017-03-31 NOTE — Progress Notes (Signed)
Progress Note  Patient Name: Derek Barnes Date of Encounter: 03/31/2017  Primary Cardiologist: No primary care provider on file.   Subjective   Feeling well.  Wants to go home.  No recurrent GI bleed or VT.   Inpatient Medications    Scheduled Meds: . amiodarone  200 mg Oral BID  . atorvastatin  40 mg Oral Daily  . bumetanide  1 mg Oral Daily  . digoxin  125 mcg Oral Daily  . insulin aspart  0-15 Units Subcutaneous TID WC  . insulin aspart  0-5 Units Subcutaneous QHS  . isosorbide mononitrate  30 mg Oral Daily  . mesalamine  2.4 g Oral Q breakfast  . metFORMIN  500 mg Oral BID WC  . metoprolol succinate  50 mg Oral Daily  . predniSONE  20 mg Oral Q breakfast  . sacubitril-valsartan  1 tablet Oral BID  . tamsulosin  0.4 mg Oral Daily   Continuous Infusions:  PRN Meds: acetaminophen **OR** acetaminophen, metoprolol tartrate, nitroGLYCERIN, ondansetron **OR** ondansetron (ZOFRAN) IV   Vital Signs    Vitals:   03/31/17 0700 03/31/17 0800 03/31/17 0935 03/31/17 1005  BP: (!) 103/55 (!) 99/55 (!) 113/59 122/68  Pulse: 66 70 81 76  Resp: 15 15 (!) 22 16  Temp:  98 F (36.7 C)    TempSrc:  Oral    SpO2: 97% 98% 100% 100%  Weight:      Height:        Intake/Output Summary (Last 24 hours) at 03/31/2017 1103 Last data filed at 03/31/2017 1025 Gross per 24 hour  Intake 1420 ml  Output 2325 ml  Net -905 ml   Filed Weights   03/29/17 0849 03/29/17 2300  Weight: 194 lb (88 kg) 199 lb 4.7 oz (90.4 kg)    Telemetry    VP.  Occasional PVCs.  - Personally Reviewed  ECG    n/a - Personally Reviewed  Physical Exam   VS:  BP 122/68   Pulse 76   Temp 98 F (36.7 C) (Oral)   Resp 16   Ht 5\' 8"  (1.727 m)   Wt 199 lb 4.7 oz (90.4 kg)   SpO2 100%   BMI 30.30 kg/m  , BMI Body mass index is 30.3 kg/m. GENERAL:  Well appearing.  No acute distress.  HEENT: Pupils equal round and reactive, fundi not visualized, oral mucosa unremarkable NECK:  No jugular venous  distention, waveform within normal limits, carotid upstroke brisk and symmetric, no bruits, no thyromegaly LUNGS:  Clear to auscultation bilaterally HEART:  RRR.  PMI not displaced or sustained,S1 and S2 within normal limits, no S3, no S4, no clicks, no rubs, no murmurs ABD:  Flat, positive bowel sounds normal in frequency in pitch, no bruits, no rebound, no guarding, no midline pulsatile mass, no hepatomegaly, no splenomegaly EXT:  2 plus pulses throughout, no edema, no cyanosis no clubbing SKIN:  No rashes no nodules NEURO:  Cranial nerves II through XII grossly intact, motor grossly intact throughout [facility]:  Cognitively intact, oriented to person place and time   Labs    Chemistry Recent Labs  Lab 03/29/17 0550 03/29/17 1907 03/30/17 0001 03/30/17 0448  NA 137  --  131* 133*  K 3.8 3.6 4.0 4.1  CL 102  --  99* 101  CO2 24  --  21* 22  GLUCOSE 191*  --  355* 213*  BUN 18  --  20 20  CREATININE 1.18  --  1.16 1.02  CALCIUM 8.4*  --  8.0* 8.1*  GFRNONAA >60  --  >60 >60  GFRAA >60  --  >60 >60  ANIONGAP 11  --  11 10     Hematology Recent Labs  Lab 03/29/17 0920  03/30/17 0001 03/30/17 0448 03/30/17 1304  WBC 10.1  --  10.7* 11.9*  --   RBC 3.75*  --  3.60* 3.68*  --   HGB 11.3*   < > 10.9* 11.2* 11.3*  HCT 33.5*  --  31.8* 32.7*  --   MCV 89.3  --  88.4 88.8  --   MCH 30.0  --  30.4 30.5  --   MCHC 33.6  --  34.4 34.4  --   RDW 16.1*  --  15.7* 15.5*  --   PLT 168  --  141* 158  --    < > = values in this interval not displayed.    Cardiac Enzymes Recent Labs  Lab 03/29/17 1235 03/29/17 1806 03/30/17 0001 03/30/17 0448  TROPONINI 0.07* 0.06* 0.08* 0.04*   No results for input(s): TROPIPOC in the last 168 hours.   BNPNo results for input(s): BNP, PROBNP in the last 168 hours.   DDimer No results for input(s): DDIMER in the last 168 hours.   Radiology    Dg Chest Port 1 View  Result Date: 03/29/2017 CLINICAL DATA:  Arrythmia. EXAM: PORTABLE CHEST 1  VIEW COMPARISON:  Frontal and lateral views earlier this day. FINDINGS: Multilead left-sided pacemaker remains in place. Cardiomegaly is unchanged. Improved vascular congestion from exam earlier this day. No pulmonary edema. No developing airspace consolidation. No pleural fluid or pneumothorax. Surgical anchor overlies the right proximal humerus. IMPRESSION: Stable cardiomegaly. Improving vascular congestion. No new abnormality. Electronically Signed   By: Rubye Oaks M.D.   On: 03/29/2017 23:09    Cardiac Studies   Echo 10/14/15:  Technically difficult study due to chest wall/lung interference  Dilated left ventricle - severe  Severely decreased left ventricular systolic function, ejection fraction  10 to 15%  Degenerative mitral valve disease  Mitral regurgitation - mild to moderate  Dilated left atrium - mild  Normal right ventricular systolic function   Patient Profile     67 y.o. male  with a hx of chronic systolic and diastolic heart failure (NICM, LVEF 5-10%, status post ICD, prior cardiac arrest, paroxysmal atrial fibrillation, hypertension, and ulcerative colitis here with GI bleed when he was found to have sustained VT at a rate of 160 bpm.   Assessment & Plan    # Ventricular tachycardia:  Sustained VT lasting 6 minutes and 4 minutes on 03/29/17.  He also had an event on 1/3 that lasted for 3 minutes.  His ventricular rate was in the 160s.  Based on his device settings on admission, no therapy was applied.  This is likely due to his history of inappropriate shocks for atrial fibrillation.  He is already receiving amiodarone 400 mg daily.  He hadn't had any VT from the time of device generator change until now.  Magnesium was mildly abnormal, though I doubt that this was the cause of his ventricular arrhythmia.  He seems euvolemic from a heart failure standpoint.  I suspect that his GI bleed is what caused his instability.  We have discussed this with Dr. Graciela Husbands of EP.  He  recommends changing his therapies to 3 zones, each with ATP and shocking.  Therefore, if he has another episode of ventricular tachycardia in the 160s he  will receive therapies.  Continue amiodarone, digoxin, and metoprolol.  He will follow up with Dr. Pernell Dupre this week.   # Chronic systolic and diastolic heart failure:  Currently euvolemic.  Optivol levels are good. Continue digoxin, Imdur, metoprolol, and Entresto.  Resume bumex and Entresto now that GI bleed and BP are stable.   # Hyperlipidemia: Continue atorvastatin.     For questions or updates, please contact [facility] Please consult [email or web address] for contact info under Cardiology/STEMI.      Signed, Chilton Si, MD  03/31/2017, 11:03 AM

## 2017-03-31 NOTE — Plan of Care (Signed)
Patient continues with irregular heart rate and rhythm. No complaints of chest pain or SOB.  Makes needs known.  Verbalized desire to be discharged tomorrow.  Entresto given at bedtime.  BP within parameters also patient requests it not to be held. Patient continues to refuse insulin and CBGs at this time.  No acute distress noted.  Will continue to monitor.

## 2017-03-31 NOTE — Progress Notes (Signed)
Nutrition Brief Note  Patient identified on the Malnutrition Screening Tool (MST) Report  Wt Readings from Last 15 Encounters:  03/29/17 199 lb 4.7 oz (90.4 kg)  03/29/17 194 lb 3.2 oz (88.1 kg)  03/29/17 200 lb (90.7 kg)  03/19/17 200 lb (90.7 kg)  01/23/17 200 lb (90.7 kg)  03/13/15 200 lb (90.7 kg)  08/16/14 201 lb (91.2 kg)   Met with patient at bedside. He reports that PTA he had a decreased appetite for 2 weeks. He reports anorexia during that time period causing him to eat small meals during the day. He reports feeling much better now and his appetite has returned to normal. Denies N/V, abdominal pain, constipation/diarrhea, difficulty chewing/swallowing. UBW 200 lbs. Patient has not lost any weight.  Offered education on heart healthy/carbohydrate modified diet to patient but he refuses at this time reporting he has already received education about his diet and knows what to eat. Noted HgbA1c was 11.3 on 03/30/2017.  Discussed with RN. Patient has a good appetite and ate 100% of breakfast this AM.  Patient does not meet criteria for malnutrition at this time.  Nutrition Focused Physical Exam:   Most Recent Value  Orbital Region  No depletion  Upper Arm Region  No depletion  Thoracic and Lumbar Region  No depletion  Buccal Region  No depletion  Temple Region  No depletion  Clavicle Bone Region  No depletion  Clavicle and Acromion Bone Region  No depletion  Scapular Bone Region  No depletion  Dorsal Hand  No depletion  Patellar Region  No depletion  Anterior Thigh Region  No depletion  Posterior Calf Region  No depletion  Edema (RD Assessment)  None  Hair  Reviewed  Eyes  Reviewed  Mouth  Reviewed  Skin  Reviewed  Nails  Reviewed     Body mass index is 30.3 kg/m. Patient meets criteria for Obesity Class I based on current BMI.   Current diet order is Heart Healthy/Carbohydrate Modified, patient is consuming approximately 100% of meals at this time. Labs and  medications reviewed.   No nutrition interventions warranted at this time. If nutrition issues arise, please consult RD.   Willey Blade, MS, [place], LDN Office: [phone] Pager: [phone] After Hours/Weekend Pager: [phone]

## 2017-04-01 ENCOUNTER — Inpatient Hospital Stay (HOSPITAL_COMMUNITY)
Admit: 2017-04-01 | Discharge: 2017-04-01 | Disposition: A | Discharge status: Home / Self Care | Payer: BC Managed Care – PPO | Attending: Internal Medicine | Admitting: Internal Medicine

## 2017-04-01 ENCOUNTER — Encounter: Payer: Self-pay | Admitting: Emergency Medicine

## 2017-04-01 ENCOUNTER — Inpatient Hospital Stay
Admission: EM | Admit: 2017-04-01 | Discharge: 2017-04-02 | DRG: 065 | Disposition: A | Discharge status: Home / Self Care | Payer: BC Managed Care – PPO | Attending: Internal Medicine | Admitting: Internal Medicine

## 2017-04-01 ENCOUNTER — Emergency Department: Payer: BC Managed Care – PPO

## 2017-04-01 ENCOUNTER — Inpatient Hospital Stay: Payer: BC Managed Care – PPO

## 2017-04-01 ENCOUNTER — Other Ambulatory Visit: Payer: Self-pay

## 2017-04-01 DIAGNOSIS — Z95810 Presence of automatic (implantable) cardiac defibrillator: Secondary | ICD-10-CM | POA: Diagnosis not present

## 2017-04-01 DIAGNOSIS — N400 Enlarged prostate without lower urinary tract symptoms: Secondary | ICD-10-CM | POA: Diagnosis present

## 2017-04-01 DIAGNOSIS — R4701 Aphasia: Secondary | ICD-10-CM | POA: Diagnosis present

## 2017-04-01 DIAGNOSIS — N189 Chronic kidney disease, unspecified: Secondary | ICD-10-CM | POA: Diagnosis present

## 2017-04-01 DIAGNOSIS — I63412 Cerebral infarction due to embolism of left middle cerebral artery: Secondary | ICD-10-CM | POA: Diagnosis not present

## 2017-04-01 DIAGNOSIS — Z91041 Radiographic dye allergy status: Secondary | ICD-10-CM

## 2017-04-01 DIAGNOSIS — Z880 Allergy status to penicillin: Secondary | ICD-10-CM

## 2017-04-01 DIAGNOSIS — I248 Other forms of acute ischemic heart disease: Secondary | ICD-10-CM | POA: Diagnosis present

## 2017-04-01 DIAGNOSIS — R413 Other amnesia: Secondary | ICD-10-CM | POA: Diagnosis not present

## 2017-04-01 DIAGNOSIS — I639 Cerebral infarction, unspecified: Secondary | ICD-10-CM | POA: Diagnosis not present

## 2017-04-01 DIAGNOSIS — I480 Paroxysmal atrial fibrillation: Secondary | ICD-10-CM | POA: Diagnosis present

## 2017-04-01 DIAGNOSIS — I472 Ventricular tachycardia: Secondary | ICD-10-CM | POA: Diagnosis present

## 2017-04-01 DIAGNOSIS — I428 Other cardiomyopathies: Secondary | ICD-10-CM | POA: Diagnosis present

## 2017-04-01 DIAGNOSIS — E1122 Type 2 diabetes mellitus with diabetic chronic kidney disease: Secondary | ICD-10-CM | POA: Diagnosis present

## 2017-04-01 DIAGNOSIS — I252 Old myocardial infarction: Secondary | ICD-10-CM | POA: Diagnosis not present

## 2017-04-01 DIAGNOSIS — I340 Nonrheumatic mitral (valve) insufficiency: Secondary | ICD-10-CM

## 2017-04-01 DIAGNOSIS — I5022 Chronic systolic (congestive) heart failure: Secondary | ICD-10-CM | POA: Diagnosis present

## 2017-04-01 DIAGNOSIS — I2510 Atherosclerotic heart disease of native coronary artery without angina pectoris: Secondary | ICD-10-CM | POA: Diagnosis present

## 2017-04-01 DIAGNOSIS — I130 Hypertensive heart and chronic kidney disease with heart failure and stage 1 through stage 4 chronic kidney disease, or unspecified chronic kidney disease: Secondary | ICD-10-CM | POA: Diagnosis present

## 2017-04-01 DIAGNOSIS — Z888 Allergy status to other drugs, medicaments and biological substances status: Secondary | ICD-10-CM | POA: Diagnosis not present

## 2017-04-01 DIAGNOSIS — R29701 NIHSS score 1: Secondary | ICD-10-CM | POA: Diagnosis present

## 2017-04-01 DIAGNOSIS — K5190 Ulcerative colitis, unspecified, without complications: Secondary | ICD-10-CM | POA: Diagnosis present

## 2017-04-01 DIAGNOSIS — R748 Abnormal levels of other serum enzymes: Secondary | ICD-10-CM

## 2017-04-01 LAB — DIFFERENTIAL
BASOS ABS: 0 10*3/uL (ref 0–0.1)
BASOS PCT: 0 %
Eosinophils Absolute: 0 10*3/uL (ref 0–0.7)
Eosinophils Relative: 0 %
LYMPHS PCT: 11 %
Lymphs Abs: 0.7 10*3/uL — ABNORMAL LOW (ref 1.0–3.6)
MONO ABS: 0.7 10*3/uL (ref 0.2–1.0)
Monocytes Relative: 10 %
NEUTROS PCT: 79 %
Neutro Abs: 5.2 10*3/uL (ref 1.4–6.5)

## 2017-04-01 LAB — GLUCOSE, CAPILLARY
GLUCOSE-CAPILLARY: 124 mg/dL — AB (ref 65–99)
GLUCOSE-CAPILLARY: 141 mg/dL — AB (ref 65–99)
GLUCOSE-CAPILLARY: 145 mg/dL — AB (ref 65–99)
Glucose-Capillary: 201 mg/dL — ABNORMAL HIGH (ref 65–99)

## 2017-04-01 LAB — COMPREHENSIVE METABOLIC PANEL
ALBUMIN: 3.1 g/dL — AB (ref 3.5–5.0)
ALK PHOS: 85 U/L (ref 38–126)
ALT: 18 U/L (ref 17–63)
AST: 19 U/L (ref 15–41)
Anion gap: 7 (ref 5–15)
BUN: 22 mg/dL — ABNORMAL HIGH (ref 6–20)
CALCIUM: 8.1 mg/dL — AB (ref 8.9–10.3)
CHLORIDE: 102 mmol/L (ref 101–111)
CO2: 27 mmol/L (ref 22–32)
Creatinine, Ser: 1.08 mg/dL (ref 0.61–1.24)
GFR calc non Af Amer: >60 mL/min (ref >60)
GLUCOSE: 132 mg/dL — AB (ref 65–99)
Potassium: 3.4 mmol/L — ABNORMAL LOW (ref 3.5–5.1)
SODIUM: 136 mmol/L (ref 135–145)
Total Bilirubin: 0.8 mg/dL (ref 0.3–1.2)
Total Protein: 6.3 g/dL — ABNORMAL LOW (ref 6.5–8.1)

## 2017-04-01 LAB — CBC
HCT: 34.4 % — ABNORMAL LOW (ref 40.0–52.0)
Hemoglobin: 11.5 g/dL — ABNORMAL LOW (ref 13.0–18.0)
MCH: 30 pg (ref 26.0–34.0)
MCHC: 33.5 g/dL (ref 32.0–36.0)
MCV: 89.7 fL (ref 80.0–100.0)
PLATELETS: 173 10*3/uL (ref 150–440)
RBC: 3.83 MIL/uL — AB (ref 4.40–5.90)
RDW: 15.6 % — AB (ref 11.5–14.5)
WBC: 6.6 10*3/uL (ref 3.8–10.6)

## 2017-04-01 LAB — TROPONIN I
Troponin I: 0.05 ng/mL (ref <0.03)
Troponin I: 0.03 ng/mL (ref <0.03)
Troponin I: 0.03 ng/mL (ref <0.03)

## 2017-04-01 LAB — PROTIME-INR
INR: 0.93
PROTHROMBIN TIME: 12.4 s (ref 11.4–15.2)

## 2017-04-01 LAB — APTT: APTT: 25 s (ref 24–36)

## 2017-04-01 MED ORDER — INSULIN ASPART 100 UNIT/ML ~~LOC~~ SOLN: 0.0000 [IU] | Freq: Every day | SUBCUTANEOUS | Status: DC

## 2017-04-01 MED ORDER — DIGOXIN 125 MCG PO TABS
125.0000 ug | ORAL_TABLET | Freq: Every day | ORAL | Status: DC
  Administered 2017-04-02: 125 ug via ORAL
  Filled 2017-04-01: qty 1

## 2017-04-01 MED ORDER — ISOSORBIDE MONONITRATE ER 30 MG PO TB24
30.0000 mg | ORAL_TABLET | Freq: Every day | ORAL | Status: DC
  Administered 2017-04-02: 11:00:00 30 mg via ORAL
  Filled 2017-04-01: qty 1

## 2017-04-01 MED ORDER — HYDRALAZINE HCL 20 MG/ML IJ SOLN: 10.0000 mg | Freq: Four times a day (QID) | INTRAMUSCULAR | Status: DC | PRN

## 2017-04-01 MED ORDER — SPIRONOLACTONE 25 MG PO TABS: 25.0000 mg | ORAL_TABLET | Freq: Every day | ORAL | Status: DC

## 2017-04-01 MED ORDER — ATORVASTATIN CALCIUM 20 MG PO TABS
40.0000 mg | ORAL_TABLET | Freq: Every day | ORAL | Status: DC
  Administered 2017-04-02: 11:00:00 40 mg via ORAL
  Filled 2017-04-01: qty 2

## 2017-04-01 MED ORDER — INSULIN ASPART 100 UNIT/ML ~~LOC~~ SOLN: 0.0000 [IU] | Freq: Three times a day (TID) | SUBCUTANEOUS | Status: DC

## 2017-04-01 MED ORDER — MESALAMINE 1.2 G PO TBEC
2.4000 g | DELAYED_RELEASE_TABLET | Freq: Every day | ORAL | Status: DC
  Administered 2017-04-02: 1.2 g via ORAL
  Filled 2017-04-01: qty 2

## 2017-04-01 MED ORDER — BUMETANIDE 1 MG PO TABS
1.0000 mg | ORAL_TABLET | Freq: Every day | ORAL | Status: DC
  Filled 2017-04-01: qty 1

## 2017-04-01 MED ORDER — ACETAMINOPHEN 325 MG PO TABS
650.0000 mg | ORAL_TABLET | ORAL | Status: DC | PRN
Start: 2017-04-01 — End: 2017-04-02

## 2017-04-01 MED ORDER — ENOXAPARIN SODIUM 40 MG/0.4ML ~~LOC~~ SOLN
40.0000 mg | SUBCUTANEOUS | Status: DC
  Filled 2017-04-01: qty 0.4

## 2017-04-01 MED ORDER — ACETAMINOPHEN 650 MG RE SUPP: 650.0000 mg | RECTAL | Status: DC | PRN

## 2017-04-01 MED ORDER — METOPROLOL SUCCINATE ER 50 MG PO TB24
50.0000 mg | ORAL_TABLET | Freq: Every day | ORAL | Status: DC
  Administered 2017-04-02: 11:00:00 50 mg via ORAL
  Filled 2017-04-01: qty 1

## 2017-04-01 MED ORDER — TAMSULOSIN HCL 0.4 MG PO CAPS
0.4000 mg | ORAL_CAPSULE | Freq: Every day | ORAL | Status: DC
  Administered 2017-04-02: 0.4 mg via ORAL
  Filled 2017-04-01: qty 1

## 2017-04-01 MED ORDER — ACETAMINOPHEN 160 MG/5ML PO SOLN
650.0000 mg | ORAL | Status: DC | PRN
  Filled 2017-04-01: qty 20.3

## 2017-04-01 MED ORDER — SACUBITRIL-VALSARTAN 24-26 MG PO TABS
1.0000 | ORAL_TABLET | Freq: Two times a day (BID) | ORAL | Status: DC
  Administered 2017-04-02: 11:00:00 1 via ORAL
  Filled 2017-04-01 (×2): qty 1

## 2017-04-01 MED ORDER — INSULIN ASPART 100 UNIT/ML ~~LOC~~ SOLN
0.0000 [IU] | Freq: Three times a day (TID) | SUBCUTANEOUS | Status: DC
  Filled 2017-04-01: qty 1

## 2017-04-01 MED ORDER — PERFLUTREN LIPID MICROSPHERE
1.0000 mL | INTRAVENOUS | Status: AC | PRN
  Administered 2017-04-01: 1.5 mL via INTRAVENOUS

## 2017-04-01 MED ORDER — AMIODARONE HCL 200 MG PO TABS
200.0000 mg | ORAL_TABLET | Freq: Two times a day (BID) | ORAL | Status: DC
  Administered 2017-04-01 – 2017-04-02 (×2): 200 mg via ORAL
  Filled 2017-04-01 (×2): qty 1

## 2017-04-01 MED ORDER — BUDESONIDE 3 MG PO CPEP
9.0000 mg | ORAL_CAPSULE | Freq: Every day | ORAL | Status: DC
Start: 2017-04-02 — End: 2017-04-02
  Filled 2017-04-01: qty 3

## 2017-04-01 MED ORDER — STROKE: EARLY STAGES OF RECOVERY BOOK
Freq: Once | Status: AC
  Administered 2017-04-01 – 2017-04-02 (×2)

## 2017-04-01 MED ORDER — SENNOSIDES-DOCUSATE SODIUM 8.6-50 MG PO TABS
1.0000 | ORAL_TABLET | Freq: Every evening | ORAL | Status: DC | PRN
Start: 2017-04-01 — End: 2017-04-02

## 2017-04-01 NOTE — Consult Note (Signed)
Referring Physician: Mody    Chief Complaint: Difficulty with memory  HPI: Derek Barnes is an 66 y.o. male with a history of CAD and recent GIB taken off ASA who presents with complaints with memory and reading.  Patient reports going to bed at baseline on yesterday but noting at about 3AM and was unable to remember names.  Went back to bed and when he awakened this morning again was unable to remember names,  Dropped a glass out of his right hand.  Later noted that he was unable to read.  Patient presented for evaluation at that time.  Initial NIHSS of 1.  Date last known well: Date: 03/31/2017 Time last known well: Time: 22:00 tPA Given: No: Outside time window  Past Medical History:  Diagnosis Date  . CHF (congestive heart failure) (HCC)   . Chronic kidney disease (CKD)   . Colitis   . Decreased cardiac ejection fraction    EF 5-10%  . Dysrhythmia   . Hypertension   . Liver abscess   . Myocardial infarct (HCC)   . Nonischemic cardiomyopathy [facility])     Past Surgical History:  Procedure Laterality Date  . ACD DEFIBRILLATOR    . COLONOSCOPY    . COLONOSCOPY WITH PROPOFOL N/A 01/23/2017   Procedure: COLONOSCOPY WITH PROPOFOL;  Surgeon: Toledo, Boykin Nearing, MD;  Location: [facility] ENDOSCOPY;  Service: Gastroenterology;  Laterality: N/A;  . FRACTURE SURGERY    . HERNIA REPAIR    . INSERT / REPLACE / REMOVE PACEMAKER    . KNEE ARTHROSCOPY    . SHOULDER ARTHROSCOPY      Family History  Problem Relation Age of Onset  . Cardiomyopathy Mother   . Prostate cancer Neg Hx   . Bladder Cancer Neg Hx   . Kidney cancer Neg Hx    Social History:  reports that  has never smoked. he has never used smokeless tobacco. He reports that he does not drink alcohol or use drugs.  Allergies:  Allergies  Allergen Reactions  . Contrast Media [Iodinated Diagnostic Agents] Anaphylaxis  . Penicillins Anaphylaxis    Has patient had a PCN reaction causing immediate rash, facial/tongue/throat swelling,  SOB or lightheadedness with hypotension: Yes Has patient had a PCN reaction causing severe rash involving mucus membranes or skin necrosis: Yes Has patient had a PCN reaction that required hospitalization: No Has patient had a PCN reaction occurring within the last 10 years: No If all of the above answers are "NO", then may proceed with Cephalosporin use.   . Sotalol Other (See Comments)    Excessive fatigue    Medications:  I have reviewed the patient's current medications. Prior to Admission:  Medications Prior to Admission  Medication Sig Dispense Refill Last Dose  . amiodarone (PACERONE) 200 MG tablet Take 200 mg by mouth 2 (two) times daily.   04/01/2017 at Unknown time  . atorvastatin (LIPITOR) 40 MG tablet Take 40 mg by mouth daily.   04/01/2017 at Unknown time  . Budesonide ER 9 MG TB24 Take 1 tablet by mouth daily. 30 tablet 0 04/01/2017 at Unknown time  . bumetanide (BUMEX) 1 MG tablet Take 1 mg by mouth daily.   04/01/2017 at Unknown time  . digoxin (LANOXIN) 0.125 MG tablet Take 1 tablet by mouth daily.    04/01/2017 at Unknown time  . eplerenone (INSPRA) 50 MG tablet Take 50 mg by mouth daily.   04/01/2017 at Unknown time  . insulin aspart (NOVOLOG) 100 UNIT/ML injection Inject 0-15  Units into the skin 3 (three) times daily before meals.   Past Week at Unknown time  . isosorbide mononitrate (IMDUR) 30 MG 24 hr tablet Take 30 mg by mouth daily.   04/01/2017 at Unknown time  . metFORMIN (GLUCOPHAGE) 500 MG tablet Take by mouth 2 (two) times daily with a meal.   04/01/2017 at Unknown time  . metoprolol succinate (TOPROL-XL) 50 MG 24 hr tablet Take 50 mg by mouth daily. Take with or immediately following a meal.   04/01/2017 at Unknown time  . sacubitril-valsartan (ENTRESTO) 24-26 MG Take 1 tablet by mouth 2 (two) times daily.   04/01/2017 at Unknown time  . tamsulosin (FLOMAX) 0.4 MG CAPS capsule Take 1 capsule (0.4 mg total) by mouth daily. 30 capsule 0 04/01/2017 at Unknown time  . mesalamine  (LIALDA) 1.2 g EC tablet Take 2 tablets (2.4 g total) by mouth daily with breakfast. 60 tablet 0   . nitroGLYCERIN (NITROSTAT) 0.4 MG SL tablet Place 1 tablet under the tongue every 5 (five) minutes as needed.  0 prn at prn   Scheduled: . amiodarone  200 mg Oral BID  . [START ON 04/02/2017] atorvastatin  40 mg Oral Daily  . [START ON 04/02/2017] budesonide  9 mg Oral Daily  . [START ON 04/02/2017] bumetanide  1 mg Oral Daily  . [START ON 04/02/2017] digoxin  125 mcg Oral Daily  . enoxaparin (LOVENOX) injection  40 mg Subcutaneous Q24H  . insulin aspart  0-5 Units Subcutaneous QHS  . insulin aspart  0-9 Units Subcutaneous TID WC  . [START ON 04/02/2017] isosorbide mononitrate  30 mg Oral Daily  . [START ON 04/02/2017] mesalamine  2.4 g Oral Q breakfast  . [START ON 04/02/2017] metoprolol succinate  50 mg Oral Daily  . [START ON 04/02/2017] sacubitril-valsartan  1 tablet Oral BID  . [START ON 04/02/2017] spironolactone  25 mg Oral Daily  . [START ON 04/02/2017] tamsulosin  0.4 mg Oral Daily    ROS: History obtained from the patient  General ROS: negative for - chills, fatigue, fever, night sweats, weight gain or weight loss Psychological ROS: negative for - behavioral disorder, hallucinations, memory difficulties, mood swings or suicidal ideation Ophthalmic ROS: negative for - blurry vision, double vision, eye pain or loss of vision ENT ROS: negative for - epistaxis, nasal discharge, oral lesions, sore throat, tinnitus or vertigo Allergy and Immunology ROS: negative for - hives or itchy/watery eyes Hematological and Lymphatic ROS: negative for - bleeding problems, bruising or swollen lymph nodes Endocrine ROS: negative for - galactorrhea, hair pattern changes, polydipsia/polyuria or temperature intolerance Respiratory ROS: negative for - cough, hemoptysis, shortness of breath or wheezing Cardiovascular ROS: negative for - chest pain, dyspnea on exertion, edema or irregular heartbeat Gastrointestinal ROS:  diarrhea, melena Genito-Urinary ROS:  urinary retention Musculoskeletal ROS: negative for - joint swelling or muscular weakness Neurological ROS: as noted in HPI Dermatological ROS: negative for rash and skin lesion changes  Physical Examination: Blood pressure (!) 122/57, pulse 76, temperature (!) 97.3 F (36.3 C), resp. rate 18, height 5\' 8"  (1.727 m), weight 90.3 kg (199 lb), SpO2 98 %.  HEENT-  Normocephalic, no lesions, without obvious abnormality.  Normal external eye and conjunctiva.  Normal TM's bilaterally.  Normal auditory canals and external ears. Normal external nose, mucus membranes and septum.  Normal pharynx. Cardiovascular- S1, S2 normal, pulses palpable throughout   Lungs- chest clear, no wheezing, rales, normal symmetric air entry Abdomen- soft, non-tender; bowel sounds normal; no masses,  no organomegaly Extremities- no edema Lymph-no adenopathy palpable Musculoskeletal-no joint tenderness, deformity or swelling Skin-warm and dry, no hyperpigmentation, vitiligo, or suspicious lesions  Neurological Examination   Mental Status: Alert, oriented, thought content appropriate.  Word finding difficulties.  Difficulty reading.  Able to follow simple commands but requires reinforcement for 3-step commands. Cranial Nerves: II: Discs flat bilaterally; Visual fields grossly normal, pupils equal, round, reactive to light and accommodation III,IV, VI: ptosis not present, extra-ocular motions intact bilaterally V,VII: mild decrease in right NLF, facial light touch sensation normal bilaterally VIII: hearing normal bilaterally IX,X: gag reflex present XI: bilateral shoulder shrug XII: midline tongue extension Motor: Right : Upper extremity   5/5    Left:     Upper extremity   5/5  Lower extremity   5/5     Lower extremity   5/5 Tone and bulk:normal tone throughout; no atrophy noted Sensory: Pinprick and light touch intact throughout, bilaterally Deep Tendon Reflexes: 2+ and  symmetric throughout Plantars: Right: mute   Left: downgoing Cerebellar: Normal finger-to-nose, normal rapid alternating movements and normal heel-to-shin testing bilaterally Gait: normal gait and station   Laboratory Studies:  Basic Metabolic Panel: Recent Labs  Lab 03/29/17 0550 03/29/17 1907 03/30/17 0001 03/30/17 0448 04/01/17 0915  NA 137  --  131* 133* 136  K 3.8 3.6 4.0 4.1 3.4*  CL 102  --  99* 101 102  CO2 24  --  21* 22 27  GLUCOSE 191*  --  355* 213* 132*  BUN 18  --  20 20 22*  CREATININE 1.18  --  1.16 1.02 1.08  CALCIUM 8.4*  --  8.0* 8.1* 8.1*  MG  --  1.5* 2.5* 2.4  --   PHOS  --   --  1.5* 2.3*  --     Liver Function Tests: Recent Labs  Lab 04/01/17 0915  AST 19  ALT 18  ALKPHOS 85  BILITOT 0.8  PROT 6.3*  ALBUMIN 3.1*   No results for input(s): LIPASE, AMYLASE in the last 168 hours. No results for input(s): AMMONIA in the last 168 hours.  CBC: Recent Labs  Lab 03/29/17 0550 03/29/17 0920 03/29/17 1508 03/30/17 0001 03/30/17 0448 03/30/17 1304 04/01/17 0915  WBC 9.2 10.1  --  10.7* 11.9*  --  6.6  NEUTROABS 7.8*  --   --   --   --   --  5.2  HGB 12.5* 11.3* 11.6* 10.9* 11.2* 11.3* 11.5*  HCT 36.6* 33.5*  --  31.8* 32.7*  --  34.4*  MCV 88.8 89.3  --  88.4 88.8  --  89.7  PLT 178 168  --  141* 158  --  173    Cardiac Enzymes: Recent Labs  Lab 03/29/17 1806 03/30/17 0001 03/30/17 0448 04/01/17 0915 04/01/17 1219  TROPONINI 0.06* 0.08* 0.04* 0.05* 0.03*    BNP: Invalid input(s): POCBNP  CBG: Recent Labs  Lab 03/30/17 1129 03/30/17 1540 03/30/17 1935 04/01/17 0937 04/01/17 1402  GLUCAP 206* 209* 201* 124* 141*    Microbiology: Results for orders placed or performed during the hospital encounter of 03/29/17  MRSA PCR Screening     Status: None   Collection Time: 03/29/17 11:04 PM  Result Value Ref Range Status   MRSA by PCR NEGATIVE NEGATIVE Final    Comment:        The GeneXpert MRSA Assay (FDA approved for  NASAL specimens only), is one component of a comprehensive MRSA colonization surveillance program. It is not  intended to diagnose MRSA infection nor to guide or monitor treatment for MRSA infections. Performed at [facility], [address] [place], [place] [address]     Coagulation Studies: Recent Labs    04/01/17 0915  LABPROT 12.4  INR 0.93    Urinalysis: No results for input(s): COLORURINE, LABSPEC, PHURINE, GLUCOSEU, HGBUR, BILIRUBINUR, KETONESUR, PROTEINUR, UROBILINOGEN, NITRITE, LEUKOCYTESUR in the last 168 hours.  Invalid input(s): APPERANCEUR  Lipid Panel: No results found for: CHOL, TRIG, HDL, CHOLHDL, VLDL, LDLCALC  HgbA1C:  Lab Results  Component Value Date   HGBA1C 7.7 (H) 05/13/2013    Urine Drug Screen:  No results found for: LABOPIA, COCAINSCRNUR, LABBENZ, AMPHETMU, THCU, LABBARB  Alcohol Level: No results for input(s): ETH in the last 168 hours.  Other results: EKG: paced rhythm at 79 bpm.  Imaging: Ct Head Wo Contrast  Result Date: 04/01/2017 CLINICAL DATA:  Memory loss EXAM: CT HEAD WITHOUT CONTRAST TECHNIQUE: Contiguous axial images were obtained from the base of the skull through the vertex without intravenous contrast. COMPARISON:  None. FINDINGS: Brain: No evidence of acute infarction, hemorrhage, hydrocephalus, extra-axial collection or mass lesion/mass effect. Vascular: No hyperdense vessel or unexpected calcification. Skull: Normal. Negative for fracture or focal lesion. Sinuses/Orbits: No acute finding. Other: None. IMPRESSION: Normal head CT Electronically Signed   By: Alcide Clever M.D.   On: 04/01/2017 09:37   US Carotid Bilateral (at [facility] Only)  Result Date: 04/01/2017 CLINICAL DATA:  Stroke-like symptoms. History of the defibrillator implantation. EXAM: BILATERAL CAROTID DUPLEX ULTRASOUND TECHNIQUE: Wallace Cullens scale imaging, color Doppler and duplex ultrasound were performed of bilateral carotid and vertebral arteries in the  neck. COMPARISON:  None. FINDINGS: Criteria: Quantification of carotid stenosis is based on velocity parameters that correlate the residual internal carotid diameter with NASCET-based stenosis levels, using the diameter of the distal internal carotid lumen as the denominator for stenosis measurement. The following velocity measurements were obtained: RIGHT ICA:  103/19 cm/sec CCA:  161/15 cm/sec SYSTOLIC ICA/CCA RATIO:  0.6 DIASTOLIC ICA/CCA RATIO:  1.3 ECA:  139 cm/sec LEFT ICA:  94/29 cm/sec CCA:  150/23 cm/sec SYSTOLIC ICA/CCA RATIO:  0.6 DIASTOLIC ICA/CCA RATIO:  1.0 ECA:  92 cm/sec RIGHT CAROTID ARTERY: There is a minimal to moderate amount of echogenic plaque involving the origin and proximal aspects of the right internal carotid artery (image 22), not resulting in elevated peak systolic velocities within the interrogated course of the right internal carotid artery to suggest a hemodynamically significant stenosis. RIGHT VERTEBRAL ARTERY:  Antegrade flow LEFT CAROTID ARTERY: There is a moderate amount of scattered echogenic plaque throughout the proximal and mid aspects of the left common carotid artery (images 36 and 37). There is a moderate amount of atherosclerotic plaque involving the proximal aspect the left internal carotid artery (image 55), not resulting in elevated peak systolic velocities within the interrogated course the left internal carotid artery to suggest a hemodynamically significant stenosis. LEFT VERTEBRAL ARTERY:  Antegrade flow IMPRESSION: Moderate to large amount of bilateral atherosclerotic plaque, left greater than right, not resulting in a hemodynamically significant stenosis within either internal carotid artery. Electronically Signed   By: Simonne Come M.D.   On: 04/01/2017 13:36    Assessment: 67 y.o. male presenting with an aphasia.  No extremity numbness or weakness noted.  Patient previously on ASA but discontinued due to GIB.  Head CT unremarkable and patient unable to have  MRI due to pacer.  Allergic to contrast dye therefore unable to have CTA.  Suspect a small embolic acute infarct in a branch left MCA.  Carotid dopplers show a moderate to large amount of plaque, left greater than right, but without hemodynamically significant stenosis.  Echocardiogram pending.  A1c and LDL pending.    Stroke Risk Factors - hypertension  Plan: 1. PT consult, OT consult, Speech consult 2. Prophylactic therapy-None due to recent GIB.  Will need clearance by GI.   3. Telemetry monitoring 4. Frequent neuro checks 5. If above work up unremarkable patient would likely benefit from outpatient prolonged cardiac monitoring    Thana Farr, MD Neurology [phone] 04/01/2017, 2:43 PM

## 2017-04-01 NOTE — Progress Notes (Signed)
PT Cancellation Note  Patient Details Name: MOUHAMAD AZZARELLO MRN: [id number] DOB: 05/02/1950   Cancelled Treatment:    Reason Eval/Treat Not Completed: Patient at procedure or test/unavailable(Consult received and chart reviewed.  Patient currently with OT for evaluation.  Will re-attempt at later time/date as medically appropriate and available.)   Tyan Dy H. Manson Passey, PT, DPT, NCS 04/01/17, 2:59 PM [phone]

## 2017-04-01 NOTE — ED Notes (Signed)
Date and time results received: 04/01/17 1035 (use smartphrase ".now" to insert current time)  Test: troponin Critical Value: 0.05  Name of Provider Notified: Dr. Lenard Lance  Orders Received? Or Actions Taken?: Orders Received - See Orders for details

## 2017-04-01 NOTE — Evaluation (Signed)
Occupational Therapy Evaluation Patient Details Name: Derek Barnes MRN: [id number] DOB: 1950/12/15 Today's Date: 04/01/2017    History of Present Illness 67yo male with a known history of severe cardiomyopathy ejection fraction 5-10%, essential hypertension and chronic kidney disease stage III was admitted for possible CVA. CT negative.   Clinical Impression   Pt seen for OT evaluation this date. Pt was independent at baseline prior to admission. Presents with intact strength/sensation, baseline functional independence with ADL tasks, and impaired cognition (Pt demonstrated difficulty with immediate and delayed recall, naming of common objects, visuospatial/executive functioning, attention, minor word finding difficulty, and orientation with testing). Would benefit from more comprehensive cognitive skills  evaluation. Pt will benefit from skilled OT services to address cognitive deficits in relation to communication, higher level cognitive tasks such as medication management, and assess cognition for driving, in order to maximize return to PLOF and maximize safety. Recommend OP OT following hospitalization.     Follow Up Recommendations  Outpatient OT    Equipment Recommendations  None recommended by OT    Recommendations for Other Services       Precautions / Restrictions Precautions Precautions: None Restrictions Weight Bearing Restrictions: No      Mobility Bed Mobility Overal bed mobility: Independent                Transfers Overall transfer level: Independent                    Balance Overall balance assessment: No apparent balance deficits (not formally assessed)                                         ADL either performed or assessed with clinical judgement   ADL Overall ADL's : Independent                                       General ADL Comments: pt able to perform all basic ADL tasks with independence      Vision Baseline Vision/History: No visual deficits Patient Visual Report: No change from baseline Vision Assessment?: No apparent visual deficits     Perception     Praxis      Pertinent Vitals/Pain Pain Assessment: No/denies pain     Hand Dominance Right   Extremity/Trunk Assessment Upper Extremity Assessment Upper Extremity Assessment: Difficult to assess due to impaired cognition(strength/ROM WFL, impaired ability to follow commands for coordination)   Lower Extremity Assessment Lower Extremity Assessment: Difficult to assess due to impaired cognition;Defer to PT evaluation   Cervical / Trunk Assessment Cervical / Trunk Assessment: Normal   Communication     Cognition Arousal/Alertness: Awake/alert Behavior During Therapy: WFL for tasks assessed/performed Overall Cognitive Status: Impaired/Different from baseline Area of Impairment: Memory;Orientation;Following commands;Problem solving                 Orientation Level: Disoriented to;Place;Time   Memory: Decreased short-term memory Following Commands: Follows one step commands with increased time;Follows multi-step commands with increased time     Problem Solving: Slow processing;Difficulty sequencing;Requires verbal cues     General Comments       Exercises Other Exercises Other Exercises: Pt demonstrated difficulty with immediate and delayed recall, naming of common objects, visuospatial/executive functioning, attention, minor word finding difficulty, and orientation with testing. Would benefit from  more comprehensive cognitive evaluation.   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  OT Frequency: Min 2X/week   Barriers to D/C:            Co-evaluation              AM-PAC PT "6 Clicks" Daily Activity     Outcome Measure Help from another person eating meals?: None Help from another person taking care of personal grooming?: None Help from another person toileting, which includes using toliet, bedpan, or urinal?: None Help from another person bathing (including washing, rinsing, drying)?: None Help from another person to put on and taking off regular upper body clothing?: None Help from another person to put on and taking off regular lower body clothing?: None 6 Click Score: 24   End of Session    Activity Tolerance: Patient tolerated treatment well Patient left: in bed;with call bell/phone within reach;with family/visitor present  OT Visit Diagnosis: Other symptoms and signs involving cognitive function                Time: 3846-6599 OT Time Calculation (min): 31 min Charges:  OT General Charges $OT Visit: 1 Visit OT Evaluation $OT Eval Low Complexity: 1 Low OT Treatments $Therapeutic Activity: 8-22 mins G-Codes:      Richrd Prime, MPH, MS, OTR/L ascom [phone] 04/01/17, 4:25 PM

## 2017-04-01 NOTE — Consult Note (Signed)
Cardiology Consultation:   Patient ID: Derek Barnes; [id number]; Jan 12, 1951   Admit date: 04/01/2017 Date of Consult: 04/01/2017  Primary Care Provider: Joanna Hews, MD Primary Cardiologist: Dr. Pernell Barnes at [facility] Primary Electrophysiologist:  Derek Barnes   Patient Profile:   Derek Barnes is a 67 y.o. male with a hx of chronic systolic heart failure who is being seen today for the evaluation of mildly elevated troponin at the request of Dr. Juliene Barnes.  History of Present Illness:   Derek Barnes is a 67 year old male with prolonged history of severe systolic heart failure due to nonischemic cardiomyopathy diagnosed in 2007, previous cardiac arrest status post ICD placement, paroxysmal atrial fibrillation, hypertension, ulcerative colitis and recent ventricular tachycardia who was just discharged from the hospital yesterday and returned today with slurred speech. The patient had recent GI bleed thought to be due to ulcerative colitis.  He was hospitalized recently and had episodes of ventricular tachycardia.  He was started on amiodarone. He was discharged home yesterday and returned with slurred speech and mental status change with suspected CVA.  MRI could not be done due to presence of an ICD.  CT head showed no acute abnormalities.  His ejection fraction is 5-10% on most recent echocardiogram at [facility]. He had cardiac catheterization in 2007 that showed no significant coronary artery disease.  He also had a right and left cardiac catheterization in 2015 which showed mild nonobstructive coronary artery disease. He denies chest pain or shortness of breath.  Past Medical History:  Diagnosis Date  . CHF (congestive heart failure) (HCC)   . Chronic kidney disease (CKD)   . Colitis   . Decreased cardiac ejection fraction    EF 5-10%  . Dysrhythmia   . Hypertension   . Liver abscess   . Myocardial infarct (HCC)   . Nonischemic cardiomyopathy [facility])     Past Surgical History:  Procedure Laterality Date    . ACD DEFIBRILLATOR    . COLONOSCOPY    . COLONOSCOPY WITH PROPOFOL N/A 01/23/2017   Procedure: COLONOSCOPY WITH PROPOFOL;  Surgeon: Toledo, Boykin Nearing, MD;  Location: [facility] ENDOSCOPY;  Service: Gastroenterology;  Laterality: N/A;  . FRACTURE SURGERY    . HERNIA REPAIR    . INSERT / REPLACE / REMOVE PACEMAKER    . KNEE ARTHROSCOPY    . SHOULDER ARTHROSCOPY       Home Medications:  Prior to Admission medications   Medication Sig Start Date End Date Taking? Authorizing Provider  amiodarone (PACERONE) 200 MG tablet Take 200 mg by mouth 2 (two) times daily.   Yes [provider]  atorvastatin (LIPITOR) 40 MG tablet Take 40 mg by mouth daily.   Yes [provider]  Budesonide ER 9 MG TB24 Take 1 tablet by mouth daily. 03/31/17  Yes Barnes, Richard, MD  bumetanide (BUMEX) 1 MG tablet Take 1 mg by mouth daily.   Yes [provider]  digoxin (LANOXIN) 0.125 MG tablet Take 1 tablet by mouth daily.    Yes [provider]  eplerenone (INSPRA) 50 MG tablet Take 50 mg by mouth daily.   Yes [provider]  insulin aspart (NOVOLOG) 100 UNIT/ML injection Inject 0-15 Units into the skin 3 (three) times daily before meals.   Yes [provider]  isosorbide mononitrate (IMDUR) 30 MG 24 hr tablet Take 30 mg by mouth daily.   Yes [provider]  metFORMIN (GLUCOPHAGE) 500 MG tablet Take by mouth 2 (two) times daily with a meal.  Yes [provider]  metoprolol succinate (TOPROL-XL) 50 MG 24 hr tablet Take 50 mg by mouth daily. Take with or immediately following a meal.   Yes [provider]  sacubitril-valsartan (ENTRESTO) 24-26 MG Take 1 tablet by mouth 2 (two) times daily.   Yes [provider]  tamsulosin (FLOMAX) 0.4 MG CAPS capsule Take 1 capsule (0.4 mg total) by mouth daily. 03/19/17  Yes Myrna Blazer, MD  mesalamine (LIALDA) 1.2 g EC tablet Take 2 tablets (2.4 g total) by mouth daily with breakfast.  04/01/17   Alford Highland, MD  nitroGLYCERIN (NITROSTAT) 0.4 MG SL tablet Place 1 tablet under the tongue every 5 (five) minutes as needed. 01/19/17   [provider]    Inpatient Medications: Scheduled Meds: . amiodarone  200 mg Oral BID  . [START ON 04/02/2017] atorvastatin  40 mg Oral Daily  . [START ON 04/02/2017] budesonide  9 mg Oral Daily  . [START ON 04/02/2017] bumetanide  1 mg Oral Daily  . [START ON 04/02/2017] digoxin  125 mcg Oral Daily  . enoxaparin (LOVENOX) injection  40 mg Subcutaneous Q24H  . insulin aspart  0-15 Units Subcutaneous TID AC  . [START ON 04/02/2017] isosorbide mononitrate  30 mg Oral Daily  . [START ON 04/02/2017] mesalamine  2.4 g Oral Q breakfast  . [START ON 04/02/2017] metoprolol succinate  50 mg Oral Daily  . [START ON 04/02/2017] sacubitril-valsartan  1 tablet Oral BID  . [START ON 04/02/2017] spironolactone  25 mg Oral Daily  . [START ON 04/02/2017] tamsulosin  0.4 mg Oral Daily   Continuous Infusions:  PRN Meds: acetaminophen **OR** acetaminophen (TYLENOL) oral liquid 160 mg/5 mL **OR** acetaminophen, hydrALAZINE, senna-docusate  Allergies:    Allergies  Allergen Reactions  . Contrast Media [Iodinated Diagnostic Agents] Anaphylaxis  . Penicillins Anaphylaxis    Has patient had a PCN reaction causing immediate rash, facial/tongue/throat swelling, SOB or lightheadedness with hypotension: Yes Has patient had a PCN reaction causing severe rash involving mucus membranes or skin necrosis: Yes Has patient had a PCN reaction that required hospitalization: No Has patient had a PCN reaction occurring within the last 10 years: No If all of the above answers are "NO", then may proceed with Cephalosporin use.   . Sotalol Other (See Comments)    Excessive fatigue    Social History:   Social History   Socioeconomic History  . Marital status: Divorced    Spouse name: Not on file  . Number of children: Not on file  . Years of education: Not on file  .  Highest education level: Not on file  Social Needs  . Financial resource strain: Not on file  . Food insecurity - worry: Not on file  . Food insecurity - inability: Not on file  . Transportation needs - medical: Not on file  . Transportation needs - non-medical: Not on file  Occupational History  . Not on file  Tobacco Use  . Smoking status: Never Smoker  . Smokeless tobacco: Never Used  Substance and Sexual Activity  . Alcohol use: No  . Drug use: No  . Sexual activity: Not Currently  Other Topics Concern  . Not on file  Social History Narrative   Independent and active at baseline.    Family History:    Family History  Problem Relation Age of Onset  . Cardiomyopathy Mother   . Prostate cancer Neg Hx   . Bladder Cancer Neg Hx   . Kidney cancer Neg  Hx      ROS:  Please see the history of present illness.  ROS  All other ROS reviewed and negative.     Physical Exam/Data:   Vitals:   04/01/17 1030 04/01/17 1100 04/01/17 1130 04/01/17 1217  BP: 117/73 (!) 125/54 110/90 (!) 122/57  Pulse: 79 77 78 76  Resp: 15 20 18    Temp:      TempSrc:      SpO2:    98%  Weight:      Height:       No intake or output data in the 24 hours ending 04/01/17 1809 Filed Weights   04/01/17 0915  Weight: 199 lb (90.3 kg)   Body mass index is 30.26 kg/m.  General:  Well nourished, well developed, in no acute distress HEENT: normal Lymph: no adenopathy Neck: no JVD Endocrine:  No thryomegaly Vascular: No carotid bruits; FA pulses 2+ bilaterally without bruits  Cardiac:  normal S1, S2; RRR; no murmur  Lungs:  clear to auscultation bilaterally, no wheezing, rhonchi or rales  Abd: soft, nontender, no hepatomegaly  Ext: no edema Musculoskeletal:  No deformities, BUE and BLE strength normal and equal Skin: warm and dry  Neuro:  CNs 2-12 intact, no focal abnormalities noted Psych:  Normal affect   EKG:  The EKG was personally reviewed and demonstrates: Atrial sensed ventricular  paced rhythm Telemetry:  Telemetry was personally reviewed and demonstrates: As above  Relevant CV Studies: Echocardiogram is pending  Laboratory Data:  Chemistry Recent Labs  Lab 03/30/17 0001 03/30/17 0448 04/01/17 0915  NA 131* 133* 136  K 4.0 4.1 3.4*  CL 99* 101 102  CO2 21* 22 27  GLUCOSE 355* 213* 132*  BUN 20 20 22*  CREATININE 1.16 1.02 1.08  CALCIUM 8.0* 8.1* 8.1*  GFRNONAA >60 >60 >60  GFRAA >60 >60 >60  ANIONGAP 11 10 7     Recent Labs  Lab 04/01/17 0915  PROT 6.3*  ALBUMIN 3.1*  AST 19  ALT 18  ALKPHOS 85  BILITOT 0.8   Hematology Recent Labs  Lab 03/30/17 0001 03/30/17 0448 03/30/17 1304 04/01/17 0915  WBC 10.7* 11.9*  --  6.6  RBC 3.60* 3.68*  --  3.83*  HGB 10.9* 11.2* 11.3* 11.5*  HCT 31.8* 32.7*  --  34.4*  MCV 88.4 88.8  --  89.7  MCH 30.4 30.5  --  30.0  MCHC 34.4 34.4  --  33.5  RDW 15.7* 15.5*  --  15.6*  PLT 141* 158  --  173   Cardiac Enzymes Recent Labs  Lab 03/29/17 1806 03/30/17 0001 03/30/17 0448 04/01/17 0915 04/01/17 1219 04/01/17 1715  TROPONINI 0.06* 0.08* 0.04* 0.05* 0.03* 0.03*   No results for input(s): TROPIPOC in the last 168 hours.  BNPNo results for input(s): BNP, PROBNP in the last 168 hours.  DDimer No results for input(s): DDIMER in the last 168 hours.  Radiology/Studies:  Dg Chest 2 View  Result Date: 03/29/2017 CLINICAL DATA:  Initial evaluation for acute chest pain, shortness of breath. EXAM: CHEST  2 VIEW COMPARISON:  Prior radiograph from 03/13/2015. FINDINGS: Moderate cardiomegaly, stable. Left-sided pacemaker/AICD. Mediastinal silhouette normal. Lungs normally inflated. Perihilar vascular congestion without pulmonary edema. No pleural effusion. No focal infiltrates. No pneumothorax. No acute osseus abnormality. IMPRESSION: 1. Stable cardiomegaly with mild perihilar vascular congestion without overt pulmonary edema. 2. No other active cardiopulmonary disease. Electronically Signed   By: Rise Mu M.D.   On: 03/29/2017 06:33  Ct Head Wo Contrast  Result Date: 04/01/2017 CLINICAL DATA:  Memory loss EXAM: CT HEAD WITHOUT CONTRAST TECHNIQUE: Contiguous axial images were obtained from the base of the skull through the vertex without intravenous contrast. COMPARISON:  None. FINDINGS: Brain: No evidence of acute infarction, hemorrhage, hydrocephalus, extra-axial collection or mass lesion/mass effect. Vascular: No hyperdense vessel or unexpected calcification. Skull: Normal. Negative for fracture or focal lesion. Sinuses/Orbits: No acute finding. Other: None. IMPRESSION: Normal head CT Electronically Signed   By: Alcide Clever M.D.   On: 04/01/2017 09:37   US Carotid Bilateral (at [facility] Only)  Result Date: 04/01/2017 CLINICAL DATA:  Stroke-like symptoms. History of the defibrillator implantation. EXAM: BILATERAL CAROTID DUPLEX ULTRASOUND TECHNIQUE: Wallace Cullens scale imaging, color Doppler and duplex ultrasound were performed of bilateral carotid and vertebral arteries in the neck. COMPARISON:  None. FINDINGS: Criteria: Quantification of carotid stenosis is based on velocity parameters that correlate the residual internal carotid diameter with NASCET-based stenosis levels, using the diameter of the distal internal carotid lumen as the denominator for stenosis measurement. The following velocity measurements were obtained: RIGHT ICA:  103/19 cm/sec CCA:  161/15 cm/sec SYSTOLIC ICA/CCA RATIO:  0.6 DIASTOLIC ICA/CCA RATIO:  1.3 ECA:  139 cm/sec LEFT ICA:  94/29 cm/sec CCA:  150/23 cm/sec SYSTOLIC ICA/CCA RATIO:  0.6 DIASTOLIC ICA/CCA RATIO:  1.0 ECA:  92 cm/sec RIGHT CAROTID ARTERY: There is a minimal to moderate amount of echogenic plaque involving the origin and proximal aspects of the right internal carotid artery (image 22), not resulting in elevated peak systolic velocities within the interrogated course of the right internal carotid artery to suggest a hemodynamically significant stenosis. RIGHT  VERTEBRAL ARTERY:  Antegrade flow LEFT CAROTID ARTERY: There is a moderate amount of scattered echogenic plaque throughout the proximal and mid aspects of the left common carotid artery (images 36 and 37). There is a moderate amount of atherosclerotic plaque involving the proximal aspect the left internal carotid artery (image 55), not resulting in elevated peak systolic velocities within the interrogated course the left internal carotid artery to suggest a hemodynamically significant stenosis. LEFT VERTEBRAL ARTERY:  Antegrade flow IMPRESSION: Moderate to large amount of bilateral atherosclerotic plaque, left greater than right, not resulting in a hemodynamically significant stenosis within either internal carotid artery. Electronically Signed   By: Simonne Come M.D.   On: 04/01/2017 13:36   Dg Chest Port 1 View  Result Date: 03/29/2017 CLINICAL DATA:  Arrythmia. EXAM: PORTABLE CHEST 1 VIEW COMPARISON:  Frontal and lateral views earlier this day. FINDINGS: Multilead left-sided pacemaker remains in place. Cardiomegaly is unchanged. Improved vascular congestion from exam earlier this day. No pulmonary edema. No developing airspace consolidation. No pleural fluid or pneumothorax. Surgical anchor overlies the right proximal humerus. IMPRESSION: Stable cardiomegaly. Improving vascular congestion. No new abnormality. Electronically Signed   By: Rubye Oaks M.D.   On: 03/29/2017 23:09    Assessment and Plan:   1. Possible CVA: CT head was unremarkable.  MRI could not be done due to presence of an ICD.  Agree with neurologic workup. 2. Mildly elevated troponin: Likely due to demand ischemia in the setting of known severe systolic heart failure: The patient is known to have nonischemic cardiomyopathy with 2 previous cardiac catheterization showing no significant disease.  No need for further ischemic workup. 3. Chronic systolic heart failure: He appears to be euvolemic and currently on optimal medical  therapy 4. Paroxysmal atrial fibrillation: He is in sinus rhythm now.  It is not entirely  clear to me why he is not on anticoagulation but it could be due to recent GI bleed.  If CVA is confirmed, we should strongly consider anticoagulation.  The patient did not no the exact reason why he is not on anticoagulation and I could not find this information in care everywhere. 5. Recent ventricular tachycardia: Currently on amiodarone.   For questions or updates, please contact [facility] Please consult [email or web address] for contact info under Cardiology/STEMI.   Signed, Lorine Bears, MD  04/01/2017 6:09 PM

## 2017-04-01 NOTE — H&P (Signed)
[facility]   PATIENT NAME: Derek Barnes    MR#:  [id number]  DATE OF BIRTH:  1950-05-21  DATE OF ADMISSION:  04/01/2017  PRIMARY CARE PHYSICIAN: Joanna Hews, MD   REQUESTING/REFERRING PHYSICIAN: dr Lenard Lance  CHIEF COMPLAINT:      Could not remember people's names and could not read HISTORY OF PRESENT ILLNESS:  Derek Barnes  is a 67 y.o. male with a known history of severe cardiomyopathy ejection fraction 5-10%, essential hypertension and chronic kidney disease stage III who was discharges today from the hospital after episode of colitis as well as V. tach. Patient reports he went home and his usual state of health. He woke up around 3:00 to get something for the fridge and he all of a sudden could not remember people's names and apparently was unable to read it. He comes to the ER for further evaluation. He has no focal deficits. He is speaking in full sentences. He does appear a bit anxious. He does report he still cannot read and he has significant memory deficit. D he has a pacemaker and therefore cannot obtain MRI. ER M.D. spoke with neurologist on-call who advised admission to the hospital.  PAST MEDICAL HISTORY:   Past Medical History:  Diagnosis Date  . CHF (congestive heart failure) (HCC)   . Chronic kidney disease (CKD)   . Colitis   . Decreased cardiac ejection fraction    EF 5-10%  . Dysrhythmia   . Hypertension   . Liver abscess   . Myocardial infarct (HCC)   . Nonischemic cardiomyopathy (HCC)     PAST SURGICAL HISTORY:   Past Surgical History:  Procedure Laterality Date  . ACD DEFIBRILLATOR    . COLONOSCOPY    . COLONOSCOPY WITH PROPOFOL N/A 01/23/2017   Procedure: COLONOSCOPY WITH PROPOFOL;  Surgeon: Toledo, Boykin Nearing, MD;  Location: [facility] ENDOSCOPY;  Service: Gastroenterology;  Laterality: N/A;  . FRACTURE SURGERY    . HERNIA REPAIR    . INSERT / REPLACE / REMOVE PACEMAKER    . KNEE ARTHROSCOPY    . SHOULDER  ARTHROSCOPY      SOCIAL HISTORY:   Social History   Tobacco Use  . Smoking status: Never Smoker  . Smokeless tobacco: Never Used  Substance Use Topics  . Alcohol use: No    FAMILY HISTORY:   Family History  Problem Relation Age of Onset  . Cardiomyopathy Mother   . Prostate cancer Neg Hx   . Bladder Cancer Neg Hx   . Kidney cancer Neg Hx     DRUG ALLERGIES:   Allergies  Allergen Reactions  . Contrast Media [Iodinated Diagnostic Agents] Anaphylaxis  . Penicillins Anaphylaxis    Has patient had a PCN reaction causing immediate rash, facial/tongue/throat swelling, SOB or lightheadedness with hypotension: Yes Has patient had a PCN reaction causing severe rash involving mucus membranes or skin necrosis: Yes Has patient had a PCN reaction that required hospitalization: No Has patient had a PCN reaction occurring within the last 10 years: No If all of the above answers are "NO", then may proceed with Cephalosporin use.   . Sotalol Other (See Comments)    Excessive fatigue    REVIEW OF SYSTEMS:   Review of Systems  Constitutional: Negative.  Negative for chills, fever and malaise/fatigue.  HENT: Negative.  Negative for ear discharge, ear pain, hearing loss, nosebleeds and sore throat.   Eyes: Negative.  Negative for blurred vision and pain.  Respiratory:  Negative.  Negative for cough, hemoptysis, shortness of breath and wheezing.   Cardiovascular: Negative.  Negative for chest pain, palpitations and leg swelling.  Gastrointestinal: Negative.  Negative for abdominal pain, blood in stool, diarrhea, nausea and vomiting.  Genitourinary: Negative.  Negative for dysuria.  Musculoskeletal: Negative.  Negative for back pain.  Skin: Negative.   Neurological: Negative for dizziness, tremors, speech change, focal weakness, seizures and headaches.       Memory deficit  Endo/Heme/Allergies: Negative.  Does not bruise/bleed easily.  Psychiatric/Behavioral: Negative for depression,  hallucinations and suicidal ideas. The patient is nervous/anxious.     MEDICATIONS AT HOME:   Prior to Admission medications   Medication Sig Start Date End Date Taking? Authorizing Provider  amiodarone (PACERONE) 200 MG tablet Take 200 mg by mouth 2 (two) times daily.   Yes [provider]  atorvastatin (LIPITOR) 40 MG tablet Take 40 mg by mouth daily.   Yes [provider]  Budesonide ER 9 MG TB24 Take 1 tablet by mouth daily. 03/31/17  Yes Wieting, Richard, MD  bumetanide (BUMEX) 1 MG tablet Take 1 mg by mouth daily.   Yes [provider]  digoxin (LANOXIN) 0.125 MG tablet Take 1 tablet by mouth daily.    Yes [provider]  eplerenone (INSPRA) 50 MG tablet Take 50 mg by mouth daily.   Yes [provider]  insulin aspart (NOVOLOG) 100 UNIT/ML injection Inject 0-15 Units into the skin 3 (three) times daily before meals.   Yes [provider]  isosorbide mononitrate (IMDUR) 30 MG 24 hr tablet Take 30 mg by mouth daily.   Yes [provider]  metFORMIN (GLUCOPHAGE) 500 MG tablet Take by mouth 2 (two) times daily with a meal.   Yes [provider]  metoprolol succinate (TOPROL-XL) 50 MG 24 hr tablet Take 50 mg by mouth daily. Take with or immediately following a meal.   Yes [provider]  sacubitril-valsartan (ENTRESTO) 24-26 MG Take 1 tablet by mouth 2 (two) times daily.   Yes [provider]  tamsulosin (FLOMAX) 0.4 MG CAPS capsule Take 1 capsule (0.4 mg total) by mouth daily. 03/19/17  Yes Myrna Blazer, MD  mesalamine (LIALDA) 1.2 g EC tablet Take 2 tablets (2.4 g total) by mouth daily with breakfast. 04/01/17   Alford Highland, MD  nitroGLYCERIN (NITROSTAT) 0.4 MG SL tablet Place 1 tablet under the tongue every 5 (five) minutes as needed. 01/19/17   [provider]      VITAL SIGNS:  Blood pressure (!) 125/54, pulse 77, temperature (!) 97.3 F (36.3 C), resp. rate 20, height 5'  8" (1.727 m), weight 90.3 kg (199 lb), SpO2 99 %.  PHYSICAL EXAMINATION:   Physical Exam  Constitutional: He is oriented to person, place, and time and well-developed, well-nourished, and in no distress. No distress.  HENT:  Head: Normocephalic.  Eyes: No scleral icterus.  Neck: Normal range of motion. Neck supple. No JVD present. No tracheal deviation present.  Cardiovascular: Normal rate, regular rhythm and normal heart sounds. Exam reveals no gallop and no friction rub.  No murmur heard. Pulmonary/Chest: Effort normal and breath sounds normal. No respiratory distress. He has no wheezes. He has no rales. He exhibits no tenderness.  Abdominal: Soft. Bowel sounds are normal. He exhibits no distension and no mass. There is no tenderness. There is no rebound and no guarding.  Musculoskeletal: Normal range of motion. He exhibits no edema.  Neurological: He is alert and oriented to  person, place, and time.  Skin: Skin is warm. No rash noted. No erythema.  Psychiatric: Affect and judgment normal.      LABORATORY PANEL:   CBC Recent Labs  Lab 04/01/17 0915  WBC 6.6  HGB 11.5*  HCT 34.4*  PLT 173   ------------------------------------------------------------------------------------------------------------------  Chemistries  Recent Labs  Lab 03/30/17 0448 04/01/17 0915  NA 133* 136  K 4.1 3.4*  CL 101 102  CO2 22 27  GLUCOSE 213* 132*  BUN 20 22*  CREATININE 1.02 1.08  CALCIUM 8.1* 8.1*  MG 2.4  --   AST  --  19  ALT  --  18  ALKPHOS  --  85  BILITOT  --  0.8   ------------------------------------------------------------------------------------------------------------------  Cardiac Enzymes Recent Labs  Lab 04/01/17 0915  TROPONINI 0.05*   ------------------------------------------------------------------------------------------------------------------  RADIOLOGY:  Ct Head Wo Contrast  Result Date: 04/01/2017 CLINICAL DATA:  Memory loss EXAM: CT HEAD WITHOUT  CONTRAST TECHNIQUE: Contiguous axial images were obtained from the base of the skull through the vertex without intravenous contrast. COMPARISON:  None. FINDINGS: Brain: No evidence of acute infarction, hemorrhage, hydrocephalus, extra-axial collection or mass lesion/mass effect. Vascular: No hyperdense vessel or unexpected calcification. Skull: Normal. Negative for fracture or focal lesion. Sinuses/Orbits: No acute finding. Other: None. IMPRESSION: Normal head CT Electronically Signed   By: Alcide Clever M.D.   On: 04/01/2017 09:37    EKG:   Atrial sensed ventricular paced rhythm  IMPRESSION AND PLAN:   67 year old male with car he myopathy ejection fraction 5-10%, pacemaker, recent discharge from the hospital yesterday after ulcerative colitis flare and ventricular tachycardia who presents due to confusion.  1. Confusion, inability to read and make sense that things: Patient will be admitted for CVA workup. Carotid Doppler and echocardiogram have been ordered Patient has pacemaker and therefore MRI has not been ordered. Neurology consultation has been requested. Further workup as per neurology. Cannot obtain CTA due to iodine contrast allergy Cannot use aspirin due to recent rectal bleeding/ulcerative colitis flare PT, OT and speech consultation also requested  2. Elevated troponin/recent V. tach with ICU stay: [facility] consultation requested Continue amiodarone Continue digoxin  3. Cardiomyopathy EF 5-10%: Continue outpatient medications Continue metoprolol,Entresto,aldactone  4. Ulcerative colitis: Continue Mesalamine.  5. Diabetes: Sliding scale ordered  6. BPH: Continue Flomax   All the records are reviewed and case discussed with ED provider. Management plans discussed with the patient and he is in agreement  CODE STATUS: full  TOTAL TIME TAKING CARE OF THIS PATIENT: 45 minutes.    Duff Pozzi M.D on 04/01/2017 at 11:25 AM  Between 7am to 6pm - Pager -  [phone]  After 6pm go to [email or web address] - Social research officer, government  [facility]  Office  [phone]  CC: Primary care physician; Joanna Hews, MD

## 2017-04-01 NOTE — Progress Notes (Signed)
OT Cancellation Note  Patient Details Name: Derek Barnes MRN: [id number] DOB: January 12, 1951   Cancelled Treatment:    Reason Eval/Treat Not Completed: Patient at procedure or test/ unavailable. Order received, chart reviewed. Pt with neurologist upon initial attempt. Will re-attempt OT evaluation as pt is available.  Richrd Prime, MPH, MS, OTR/L ascom [phone] 04/01/17, 2:39 PM

## 2017-04-01 NOTE — ED Triage Notes (Signed)
States went to bed normal last night. Woke in approx 3am with inability to read, to remember names. Arrives with smile symmetrical, grips equal, speech clear but cont to state cant remember things he usually could.

## 2017-04-01 NOTE — ED Provider Notes (Signed)
[facility] Emergency Department Provider Note  Time seen: 9:54 AM  I have reviewed the triage vital signs and the nursing notes.   HISTORY  Chief Complaint Altered Mental Status    HPI Derek Barnes is a 67 y.o. male with a past medical history of CHF, CKD, hypertension, MI, presents to the emergency department for confusion and memory deficits.  According to the patient he woke this morning around 3:00 and could not remember things such as people's names.  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  States he went back to sleep however when he got up this morning he continued to have the memory deficits was having trouble recalling people's names and names of certain objects.  Patient states he went to sleep yesterday feeling normal.  Patient was recently discharged from the hospital yesterday after an admission for a GI bleed and chest pain.  Patient was ultimately discharged on prednisone, Entocort, mesalamine for his ulcerative colitis.  During the patient's admission he was noted to have ventricular tachycardia and was actually started on amiodarone drip and sent to the ICU.  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  Currently the patient states he feels well but is still having some memory difficulty.  Denies any weakness, numbness, speech difficulty, headache, chest pain, shortness of breath, abdominal pain.  Past Medical History:  Diagnosis Date  . CHF (congestive heart failure) (HCC)   . Chronic kidney disease (CKD)   . Colitis   . Decreased cardiac ejection fraction    EF 5-10%  . Dysrhythmia   . Hypertension   . Liver abscess   . Myocardial infarct (HCC)   . Nonischemic cardiomyopathy [facility])     Patient Active Problem List   Diagnosis Date Noted  . Ventricular tachycardia (HCC) 03/30/2017  . Arrhythmia   . Elevated troponin   . Chronic combined systolic and diastolic heart  failure (HCC)   . Rectal bleed 03/29/2017    Past Surgical History:  Procedure Laterality Date  . ACD DEFIBRILLATOR    . COLONOSCOPY    . COLONOSCOPY WITH PROPOFOL N/A 01/23/2017   Procedure: COLONOSCOPY WITH PROPOFOL;  Surgeon: Toledo, Boykin Nearing, MD;  Location: [facility] ENDOSCOPY;  Service: Gastroenterology;  Laterality: N/A;  . FRACTURE SURGERY    . HERNIA REPAIR    . INSERT / REPLACE / REMOVE PACEMAKER    . KNEE ARTHROSCOPY    . SHOULDER ARTHROSCOPY      Prior to Admission medications   Medication Sig Start Date End Date Taking? Authorizing Provider  amiodarone (PACERONE) 200 MG tablet Take 200 mg by mouth 2 (two) times daily.    [provider]  atorvastatin (LIPITOR) 40 MG tablet Take 40 mg by mouth daily.    [provider]  Budesonide ER 9 MG TB24 Take 1 tablet by mouth daily. 03/31/17   Alford Highland, MD  bumetanide (BUMEX) 1 MG tablet Take 1 mg by mouth daily.    [provider]  digoxin (LANOXIN) 0.125 MG tablet Take 1 tablet by mouth daily.     [provider]  eplerenone (INSPRA) 50 MG tablet Take 50 mg by mouth daily.    [provider]  isosorbide mononitrate (IMDUR) 30 MG 24 hr tablet Take 30 mg by mouth daily.    [provider]  mesalamine (LIALDA) 1.2 g EC tablet Take 2 tablets (2.4 g total) by mouth daily  with breakfast. 04/01/17   Alford Highland, MD  metFORMIN (GLUCOPHAGE) 500 MG tablet Take by mouth 2 (two) times daily with a meal.    [provider]  metoprolol succinate (TOPROL-XL) 50 MG 24 hr tablet Take 50 mg by mouth daily. Take with or immediately following a meal.    [provider]  nitroGLYCERIN (NITROSTAT) 0.4 MG SL tablet Place 1 tablet under the tongue every 5 (five) minutes as needed. 01/19/17   [provider]  sacubitril-valsartan (ENTRESTO) 24-26 MG Take 1 tablet by mouth 2 (two) times daily.    [provider]  tamsulosin (FLOMAX) 0.4 MG CAPS capsule Take 1  capsule (0.4 mg total) by mouth daily. 03/19/17   Schaevitz, Myra Rude, MD    Allergies  Allergen Reactions  . Contrast Media [Iodinated Diagnostic Agents] Anaphylaxis  . Penicillins Anaphylaxis    Has patient had a PCN reaction causing immediate rash, facial/tongue/throat swelling, SOB or lightheadedness with hypotension: Yes Has patient had a PCN reaction causing severe rash involving mucus membranes or skin necrosis: Yes Has patient had a PCN reaction that required hospitalization: No Has patient had a PCN reaction occurring within the last 10 years: No If all of the above answers are "NO", then may proceed with Cephalosporin use.   . Sotalol Other (See Comments)    Excessive fatigue    Family History  Problem Relation Age of Onset  . Cardiomyopathy Mother   . Prostate cancer Neg Hx   . Bladder Cancer Neg Hx   . Kidney cancer Neg Hx     Social History Social History   Tobacco Use  . Smoking status: Never Smoker  . Smokeless tobacco: Never Used  Substance Use Topics  . Alcohol use: No  . Drug use: No    Review of Systems Constitutional: Negative for fever. Eyes: Negative for visual changes. ENT: Negative for congestion Cardiovascular: Negative for chest pain. Respiratory: Negative for shortness of breath. Gastrointestinal: Negative for abdominal pain, vomiting.  Continues to have intermittent blood with his bowel movements. Genitourinary: Negative for dysuria. Musculoskeletal: Negative for back pain. Skin: Negative for rash. Neurological: Negative for headaches, focal weakness or numbness.  Positive for memory impairment. All other ROS negative  ____________________________________________   PHYSICAL EXAM:  VITAL SIGNS: ED Triage Vitals  Enc Vitals Group     BP 04/01/17 0914 110/81     Pulse Rate 04/01/17 0914 86     Resp 04/01/17 0914 18     Temp 04/01/17 0914 (!) 97.3 F (36.3 C)     Temp Source 04/01/17 0914 Oral     SpO2 04/01/17 0914 99 %      Weight 04/01/17 0915 199 lb (90.3 kg)     Height 04/01/17 0915 5\' 8"  (1.727 m)     Head Circumference --      Peak Flow --      Pain Score --      Pain Loc --      Pain Edu? --      Excl. in GC? --     Constitutional: Alert and oriented. Well appearing and in no distress. Eyes: Normal exam ENT   Head: Normocephalic and atraumatic   Mouth/Throat: Mucous membranes are moist. Cardiovascular: Normal rate, regular rhythm. No murmur Respiratory: Normal respiratory effort without tachypnea nor retractions. Breath sounds are clear  Gastrointestinal: Soft and nontender. No distention.  Musculoskeletal: Nontender with normal range of motion in all extremities.  Neurologic:  Normal speech and language. No gross  focal neurologic deficits.  Equal grip strengths.  No pronator drift.  5/5 motor in all extremities.  Cranial nerves intact. Skin:  Skin is warm, dry and intact.  Psychiatric: Mood and affect are normal. Speech and behavior are normal.   ____________________________________________    EKG  EKG reviewed and interpreted by myself shows an atrial sensed ventricular paced rhythm at 79 bpm with a widened QRS nonspecific ST changes.  ____________________________________________    RADIOLOGY  CT head negative  ____________________________________________   INITIAL IMPRESSION / ASSESSMENT AND PLAN / ED COURSE  Pertinent labs & imaging results that were available during my care of the patient were reviewed by me and considered in my medical decision making (see chart for details).  Patient presents to the emergency department for memory impairment.  Patient states he went to bed normal last night but was having trouble remembering things this morning and continues to have trouble.  Differential would include CVA, TIA, prednisone induced reaction, medication reaction, anemia, electrolyte or metabolic abnormality, infectious etiology.  Patient is outside of any TPA window.  I have  canceled the code stroke but we will continue the medical workup including CT scan of the head, labs and continue to closely monitor.  Patient agreeable to this plan of care.  On exam patient does have some difficulty reading sentences, continues to state memory deficits.  I discussed the patient with Dr. Thad Ranger of neurology.  Unfortunately the patient has a pacemaker and cannot have an MRI performed.  Also he has an anaphylactic IV dye allergy cannot have a CT angiography performed.  Given the patient's continued symptoms she recommends admission to the hospital for further treatment.  I discussed the patient with the hospitalist for admission.  Patient agreeable to this plan of care.   NIH Stroke Scale   Interval: Baseline Time: 11:34 AM Person Administering Scale: Joeanna Howdyshell  Administer stroke scale items in the order listed. Record performance in each category after each subscale exam. Do not go back and change scores. Follow directions provided for each exam technique. Scores should reflect what the patient does, not what the clinician thinks the patient can do. The clinician should record answers while administering the exam and work quickly. Except where indicated, the patient should not be coached (i.e., repeated requests to patient to make a special effort).   1a  Level of consciousness: 0=alert; keenly responsive  1b. LOC questions:  0=Performs both tasks correctly  1c. LOC commands: 0=Performs both tasks correctly  2.  Best Gaze: 0=normal  3.  Visual: 0=No visual loss  4. Facial Palsy: 0=Normal symmetric movement  5a.  Motor left arm: 0=No drift, limb holds 90 (or 45) degrees for full 10 seconds  5b.  Motor right arm: 0=No drift, limb holds 90 (or 45) degrees for full 10 seconds  6a. motor left leg: 0=No drift, limb holds 90 (or 45) degrees for full 10 seconds  6b  Motor right leg:  0=No drift, limb holds 90 (or 45) degrees for full 10 seconds  7. Limb Ataxia: 0=Absent   8.  Sensory: 0=Normal; no sensory loss  9. Best Language:  0=No aphasia, normal  10. Dysarthria: 1=Mild to moderate, patient slurs at least some words and at worst, can be understood with some difficulty  11. Extinction and Inattention: 0=No abnormality  12. Distal motor function: 0=Normal   Total:   1   ____________________________________________   FINAL CLINICAL IMPRESSION(S) / ED DIAGNOSES  Memory impairment CVA   Hayato Guaman,  Caryn Bee, MD 04/01/17 1135

## 2017-04-02 LAB — LIPID PANEL
Cholesterol: 105 mg/dL (ref 0–200)
HDL: 43 mg/dL (ref >40)
LDL CALC: 49 mg/dL (ref 0–99)
Total CHOL/HDL Ratio: 2.4 RATIO
Triglycerides: 67 mg/dL (ref <150)
VLDL: 13 mg/dL (ref 0–40)

## 2017-04-02 LAB — HEMOGLOBIN A1C
HEMOGLOBIN A1C: 7.7 % — AB (ref 4.8–5.6)
MEAN PLASMA GLUCOSE: 174.29 mg/dL

## 2017-04-02 LAB — TROPONIN I: Troponin I: 0.04 ng/mL (ref <0.03)

## 2017-04-02 LAB — GLUCOSE, CAPILLARY: GLUCOSE-CAPILLARY: 121 mg/dL — AB (ref 65–99)

## 2017-04-02 MED ORDER — ASPIRIN EC 81 MG PO TBEC: 81.0000 mg | DELAYED_RELEASE_TABLET | Freq: Every day | ORAL | 0 refills | Status: AC

## 2017-04-02 NOTE — Plan of Care (Signed)
  Progressing Education: Knowledge of General Education information will improve 04/02/2017 1220 - Progressing by Luretha Murphy, RN Health Behavior/Discharge Planning: Ability to manage health-related needs will improve 04/02/2017 1220 - Progressing by Luretha Murphy, RN Clinical Measurements: Ability to maintain clinical measurements within normal limits will improve 04/02/2017 1220 - Progressing by Luretha Murphy, RN Will remain free from infection 04/02/2017 1220 - Progressing by Luretha Murphy, RN Diagnostic test results will improve 04/02/2017 1220 - Progressing by Luretha Murphy, RN Respiratory complications will improve 04/02/2017 1220 - Progressing by Luretha Murphy, RN Cardiovascular complication will be avoided 04/02/2017 1220 - Progressing by Luretha Murphy, RN Activity: Risk for activity intolerance will decrease 04/02/2017 1220 - Progressing by Luretha Murphy, RN Nutrition: Adequate nutrition will be maintained 04/02/2017 1220 - Progressing by Luretha Murphy, RN Coping: Level of anxiety will decrease 04/02/2017 1220 - Progressing by Luretha Murphy, RN Elimination: Will not experience complications related to bowel motility 04/02/2017 1220 - Progressing by Luretha Murphy, RN Will not experience complications related to urinary retention 04/02/2017 1220 - Progressing by Luretha Murphy, RN Pain Managment: General experience of comfort will improve 04/02/2017 1220 - Progressing by Luretha Murphy, RN Safety: Ability to remain free from injury will improve 04/02/2017 1220 - Progressing by Luretha Murphy, RN Skin Integrity: Risk for impaired skin integrity will decrease 04/02/2017 1220 - Progressing by Luretha Murphy, RN

## 2017-04-02 NOTE — Plan of Care (Signed)
  Progressing Education: Knowledge of General Education information will improve 04/02/2017 1222 - Progressing by Luretha Murphy, RN 04/02/2017 1220 - Progressing by Luretha Murphy, RN Health Behavior/Discharge Planning: Ability to manage health-related needs will improve 04/02/2017 1222 - Progressing by Luretha Murphy, RN 04/02/2017 1220 - Progressing by Luretha Murphy, RN Clinical Measurements: Ability to maintain clinical measurements within normal limits will improve 04/02/2017 1222 - Progressing by Luretha Murphy, RN 04/02/2017 1220 - Progressing by Luretha Murphy, RN Will remain free from infection 04/02/2017 1222 - Progressing by Luretha Murphy, RN 04/02/2017 1220 - Progressing by Luretha Murphy, RN Diagnostic test results will improve 04/02/2017 1222 - Progressing by Luretha Murphy, RN 04/02/2017 1220 - Progressing by Luretha Murphy, RN Respiratory complications will improve 04/02/2017 1222 - Progressing by Luretha Murphy, RN 04/02/2017 1220 - Progressing by Luretha Murphy, RN Cardiovascular complication will be avoided 04/02/2017 1222 - Progressing by Luretha Murphy, RN 04/02/2017 1220 - Progressing by Luretha Murphy, RN Activity: Risk for activity intolerance will decrease 04/02/2017 1222 - Progressing by Luretha Murphy, RN 04/02/2017 1220 - Progressing by Luretha Murphy, RN Nutrition: Adequate nutrition will be maintained 04/02/2017 1222 - Progressing by Luretha Murphy, RN 04/02/2017 1220 - Progressing by Luretha Murphy, RN Coping: Level of anxiety will decrease 04/02/2017 1222 - Progressing by Luretha Murphy, RN 04/02/2017 1220 - Progressing by Luretha Murphy, RN Elimination: Will not experience complications related to bowel motility 04/02/2017 1222 - Progressing by Luretha Murphy, RN 04/02/2017 1220 - Progressing by Luretha Murphy, RN Will not experience complications related to urinary retention 04/02/2017 1222 - Progressing by Luretha Murphy, RN 04/02/2017 1220 - Progressing by Luretha Murphy, RN Pain  Managment: General experience of comfort will improve 04/02/2017 1222 - Progressing by Luretha Murphy, RN 04/02/2017 1220 - Progressing by Luretha Murphy, RN Safety: Ability to remain free from injury will improve 04/02/2017 1222 - Progressing by Luretha Murphy, RN 04/02/2017 1220 - Progressing by Luretha Murphy, RN Skin Integrity: Risk for impaired skin integrity will decrease 04/02/2017 1222 - Progressing by Luretha Murphy, RN 04/02/2017 1220 - Progressing by Luretha Murphy, RN Education: Knowledge of disease or condition will improve 04/02/2017 1222 - Progressing by Luretha Murphy, RN Note Handout  for stroke given STROKE Early stages of recovery. Knowledge of secondary prevention will improve 04/02/2017 1222 - Progressing by Luretha Murphy, RN

## 2017-04-02 NOTE — Progress Notes (Signed)
Pt for discharge home. A/o. No distress. Instructions discussed with pt  meds discussed  Verbalized understanding. Sl d/cd.

## 2017-04-02 NOTE — Progress Notes (Signed)
SLP Cancellation Note  Patient Details Name: Derek Barnes MRN: [id number] DOB: Oct 23, 1950   Cancelled treatment:       Reason Eval/Treat Not Completed: (pt discharged b/f evaluation could be completed). Pt admitted w/ possible CVA w/ suspected mental status changes and slurred speech and possible other Cognitive deficits. Met w/ pt briefly before lunch to inform him that ST services would return after lunch but pt insisted on discharging out of the building immediately after eating his meal. Pt had left his room when ST services arrived for eval. NSG updated SLP.     Orinda Kenner, MS, CCC-SLP Watson,Katherine 04/02/2017, 1:25 PM

## 2017-04-02 NOTE — Progress Notes (Signed)
PT Cancellation Note  Patient Details Name: Derek Barnes MRN: [id number] DOB: 01-22-1951   Cancelled Treatment:    Reason Eval/Treat Not Completed: (Evaluation attempted.  Patient currently refusing participation with therapy, voicing frustration with situation and health status.  Provided active listening and support, attempted to explain information within scope.  Hospitalist entered for rouding; therapist stepped out to allow physician time to discuss situation and care with patient in 1:1 setting.  Will continue efforts at later time as appropriate.)   Jasamine Pottinger H. Manson Passey, PT, DPT, NCS 04/02/17, 9:03 AM [phone]

## 2017-04-02 NOTE — Progress Notes (Signed)
Patient verbally abusive, cursing providers, nursing - feels we r the cause of his problems.  Blames providers for his stroke and other medical misery.  Adamant in wanting to take ASA regardless of bleeding or death. He understands all the risks and benefits.  Also d/w his wife/loved one and gone over all test results.  Time spent: 35 mins

## 2017-04-02 NOTE — Plan of Care (Signed)
  Education: Knowledge of General Education information will improve 04/02/2017 0534 - Progressing by Geri Seminole, RN   Health Behavior/Discharge Planning: Ability to manage health-related needs will improve 04/02/2017 0534 - Progressing by Geri Seminole, RN   Clinical Measurements: Ability to maintain clinical measurements within normal limits will improve 04/02/2017 0534 - Progressing by Geri Seminole, RN Will remain free from infection 04/02/2017 0534 - Progressing by Geri Seminole, RN Diagnostic test results will improve 04/02/2017 0534 - Progressing by Geri Seminole, RN Respiratory complications will improve 04/02/2017 0534 - Progressing by Geri Seminole, RN Cardiovascular complication will be avoided 04/02/2017 0534 - Progressing by Geri Seminole, RN   Activity: Risk for activity intolerance will decrease 04/02/2017 0534 - Progressing by Geri Seminole, RN   Nutrition: Adequate nutrition will be maintained 04/02/2017 0534 - Progressing by Geri Seminole, RN   Coping: Level of anxiety will decrease 04/02/2017 0534 - Progressing by Geri Seminole, RN   Elimination: Will not experience complications related to bowel motility 04/02/2017 0534 - Progressing by Geri Seminole, RN Will not experience complications related to urinary retention 04/02/2017 0534 - Progressing by Geri Seminole, RN   Pain Managment: General experience of comfort will improve 04/02/2017 0534 - Progressing by Geri Seminole, RN   Safety: Ability to remain free from injury will improve 04/02/2017 0534 - Progressing by Geri Seminole, RN   Skin Integrity: Risk for impaired skin integrity will decrease 04/02/2017 0534 - Progressing by Geri Seminole, RN

## 2017-04-03 LAB — ECHOCARDIOGRAM COMPLETE
HEIGHTINCHES: 68 in
WEIGHTICAEL: 3184 [oz_av]

## 2017-04-03 NOTE — Discharge Summary (Signed)
5        [facility] [place] at [facility]   PATIENT NAME: Derek Barnes    MR#:  [id number]  DATE OF BIRTH:  Jan 05, 1951  DATE OF ADMISSION:  04/01/2017   ADMITTING PHYSICIAN: Adrian Saran, MD  DATE OF DISCHARGE: 04/02/2017  1:58 PM  PRIMARY CARE PHYSICIAN: Joanna Hews, MD   ADMISSION DIAGNOSIS:  Memory impairment [R41.3] DISCHARGE DIAGNOSIS:  Active Problems:   Stroke (cerebrum) (HCC)  SECONDARY DIAGNOSIS:   Past Medical History:  Diagnosis Date  . CHF (congestive heart failure) (HCC)   . Chronic kidney disease (CKD)   . Colitis   . Decreased cardiac ejection fraction    EF 5-10%  . Dysrhythmia   . Hypertension   . Liver abscess   . Myocardial infarct (HCC)   . Nonischemic cardiomyopathy [facility])    HOSPITAL COURSE:  67 y.o. male with a history of CAD and recent GIB taken off ASA admitted with aphasia  1. Suspected CVA: CT head was unremarkable.  MRI could not be done due to presence of an ICD.    - No extremity numbness or weakness noted.  Patient previously on ASA but discontinued due to GIB at [facility] patient's family member.   - Allergic to contrast dye therefore unable to have CTA.   -Neurology suspects a small embolic acute infarct in a branch left MCA.  - Carotid dopplers show a moderate to large amount of plaque, left greater than right, but without hemodynamically significant stenosis.  Echocardiogram normal -  Case discussed with neurology and GI and was recommended to start aspirin. GI is in agreement.  Patient is very much wanting to start aspirin anyway. 2. Mildly elevated troponin: Likely due to demand ischemia in the setting of known severe systolic heart failure: The patient is known to have nonischemic cardiomyopathy with 2 previous cardiac catheterization showing no significant disease.  No need for further ischemic workup. 3. Chronic systolic heart failure: He appears to be euvolemic and currently on optimal medical therapy 4. Paroxysmal  atrial fibrillation: He is in sinus rhythm now.   We will leave full dose anticoagulation decision up to cardiology and neurology as an outpatient.   5. Recent ventricular tachycardia: Currently on amiodarone. DISCHARGE CONDITIONS:  stable CONSULTS OBTAINED:  Treatment Team:  Kym Groom, MD Thana Farr, MD Toney Reil, MD DRUG ALLERGIES:   Allergies  Allergen Reactions  . Contrast Media [Iodinated Diagnostic Agents] Anaphylaxis  . Penicillins Anaphylaxis    Has patient had a PCN reaction causing immediate rash, facial/tongue/throat swelling, SOB or lightheadedness with hypotension: Yes Has patient had a PCN reaction causing severe rash involving mucus membranes or skin necrosis: Yes Has patient had a PCN reaction that required hospitalization: No Has patient had a PCN reaction occurring within the last 10 years: No If all of the above answers are "NO", then may proceed with Cephalosporin use.   . Sotalol Other (See Comments)    Excessive fatigue   DISCHARGE MEDICATIONS:   Allergies as of 04/02/2017      Reactions   Contrast Media [iodinated Diagnostic Agents] Anaphylaxis   Penicillins Anaphylaxis   Has patient had a PCN reaction causing immediate rash, facial/tongue/throat swelling, SOB or lightheadedness with hypotension: Yes Has patient had a PCN reaction causing severe rash involving mucus membranes or skin necrosis: Yes Has patient had a PCN reaction that required hospitalization: No Has patient had a PCN reaction occurring within the last 10 years: No If all  of the above answers are "NO", then may proceed with Cephalosporin use.   Sotalol Other (See Comments)   Excessive fatigue      Medication List    TAKE these medications   amiodarone 200 MG tablet Commonly known as:  PACERONE Take 200 mg by mouth 2 (two) times daily.   aspirin EC 81 MG tablet Take 1 tablet (81 mg total) by mouth daily.   atorvastatin 40 MG tablet Commonly known as:   LIPITOR Take 40 mg by mouth daily.   Budesonide ER 9 MG Tb24 Take 1 tablet by mouth daily.   bumetanide 1 MG tablet Commonly known as:  BUMEX Take 1 mg by mouth daily.   digoxin 0.125 MG tablet Commonly known as:  LANOXIN Take 1 tablet by mouth daily.   ENTRESTO 24-26 MG Generic drug:  sacubitril-valsartan Take 1 tablet by mouth 2 (two) times daily.   eplerenone 50 MG tablet Commonly known as:  INSPRA Take 50 mg by mouth daily.   insulin aspart 100 UNIT/ML injection Commonly known as:  novoLOG Inject 0-15 Units into the skin 3 (three) times daily before meals.   isosorbide mononitrate 30 MG 24 hr tablet Commonly known as:  IMDUR Take 30 mg by mouth daily.   mesalamine 1.2 g EC tablet Commonly known as:  LIALDA Take 2 tablets (2.4 g total) by mouth daily with breakfast.   metFORMIN 500 MG tablet Commonly known as:  GLUCOPHAGE Take by mouth 2 (two) times daily with a meal.   metoprolol succinate 50 MG 24 hr tablet Commonly known as:  TOPROL-XL Take 50 mg by mouth daily. Take with or immediately following a meal.   nitroGLYCERIN 0.4 MG SL tablet Commonly known as:  NITROSTAT Place 1 tablet under the tongue every 5 (five) minutes as needed.   tamsulosin 0.4 MG Caps capsule Commonly known as:  FLOMAX Take 1 capsule (0.4 mg total) by mouth daily.        DISCHARGE INSTRUCTIONS:   DIET:  Cardiac diet DISCHARGE CONDITION:  Good ACTIVITY:  Activity as tolerated OXYGEN:  Home Oxygen: No.  Oxygen Delivery: room air DISCHARGE LOCATION:  home   If you experience worsening of your admission symptoms, develop shortness of breath, life threatening emergency, suicidal or homicidal thoughts you must seek medical attention immediately by calling 911 or calling your MD immediately  if symptoms less severe.  You Must read complete instructions/literature along with all the possible adverse reactions/side effects for all the Medicines you take and that have been  prescribed to you. Take any new Medicines after you have completely understood and accpet all the possible adverse reactions/side effects.   Please note  You were cared for by a hospitalist during your hospital stay. If you have any questions about your discharge medications or the care you received while you were in the hospital after you are discharged, you can call the unit and asked to speak with the hospitalist on call if the hospitalist that took care of you is not available. Once you are discharged, your primary care physician will handle any further medical issues. Please note that NO REFILLS for any discharge medications will be authorized once you are discharged, as it is imperative that you return to your primary care physician (or establish a relationship with a primary care physician if you do not have one) for your aftercare needs so that they can reassess your need for medications and monitor your lab values.    On the day  of Discharge:  VITAL SIGNS:  Blood pressure 125/75, pulse 78, temperature (!) 97.5 F (36.4 C), temperature source Oral, resp. rate 18, height 5\' 8"  (1.727 m), weight 90.3 kg (199 lb), SpO2 97 %. PHYSICAL EXAMINATION:  GENERAL:  67 y.o.-year-old patient lying in the bed with no acute distress.  EYES: Pupils equal, round, reactive to light and accommodation. No scleral icterus. Extraocular muscles intact.  HEENT: Head atraumatic, normocephalic. Oropharynx and nasopharynx clear.  NECK:  Supple, no jugular venous distention. No thyroid enlargement, no tenderness.  LUNGS: Normal breath sounds bilaterally, no wheezing, rales,rhonchi or crepitation. No use of accessory muscles of respiration.  CARDIOVASCULAR: S1, S2 normal. No murmurs, rubs, or gallops.  ABDOMEN: Soft, non-tender, non-distended. Bowel sounds present. No organomegaly or mass.  EXTREMITIES: No pedal edema, cyanosis, or clubbing.  NEUROLOGIC: Cranial nerves II through XII are intact. Muscle strength 5/5  in all extremities. Sensation intact. Gait not checked.  PSYCHIATRIC: The patient is alert and oriented x 3.  SKIN: No obvious rash, lesion, or ulcer.  DATA REVIEW:   CBC Recent Labs  Lab 04/01/17 0915  WBC 6.6  HGB 11.5*  HCT 34.4*  PLT 173    Chemistries  Recent Labs  Lab 03/30/17 0448 04/01/17 0915  NA 133* 136  K 4.1 3.4*  CL 101 102  CO2 22 27  GLUCOSE 213* 132*  BUN 20 22*  CREATININE 1.02 1.08  CALCIUM 8.1* 8.1*  MG 2.4  --   AST  --  19  ALT  --  18  ALKPHOS  --  85  BILITOT  --  0.8     Follow-up Information    Joanna Hews, MD. Schedule an appointment as soon as possible for a visit in 1 week.   Specialty:  Family Medicine Contact information: [address] [place] [address] [phone]        Lonell Face, MD. Schedule an appointment as soon as possible for a visit in 2 weeks.   Specialty:  Neurology Contact information: [address] [facility] [place] [address] [phone]        Toney Reil, MD. Schedule an appointment as soon as possible for a visit in 2 weeks.   Specialty:  Gastroenterology Contact information: [address] [place] [address] [phone]            Management plans discussed with the patient, family and they are in agreement.  CODE STATUS: Prior   TOTAL TIME TAKING CARE OF THIS PATIENT: 45 minutes.    Delfino Lovett M.D on 04/03/2017 at 5:30 PM  Between 7am to 6pm - Pager - [phone]  After 6pm go to [email or web address] - Social research officer, government  [facility] [place] [facility]  Office  [phone]  CC: Primary care physician; Joanna Hews, MD   Note: This dictation was prepared with Dragon dictation along with smaller phrase technology. Any transcriptional errors that result from this process are unintentional.

## 2017-04-05 ENCOUNTER — Ambulatory Visit: Payer: BC Managed Care – PPO | Admitting: Urology

## 2017-04-05 ENCOUNTER — Encounter: Payer: Self-pay | Admitting: Urology

## 2017-10-15 ENCOUNTER — Emergency Department: Payer: BC Managed Care – PPO

## 2017-10-15 ENCOUNTER — Inpatient Hospital Stay
Admission: EM | Admit: 2017-10-15 | Discharge: 2017-10-25 | DRG: 871 | Disposition: A | Discharge status: Other Acute Inpatient Hospital | Payer: BC Managed Care – PPO | Attending: Internal Medicine | Admitting: Internal Medicine

## 2017-10-15 ENCOUNTER — Other Ambulatory Visit: Payer: Self-pay

## 2017-10-15 ENCOUNTER — Encounter: Payer: Self-pay | Admitting: *Deleted

## 2017-10-15 DIAGNOSIS — I252 Old myocardial infarction: Secondary | ICD-10-CM

## 2017-10-15 DIAGNOSIS — Z79899 Other long term (current) drug therapy: Secondary | ICD-10-CM

## 2017-10-15 DIAGNOSIS — R002 Palpitations: Secondary | ICD-10-CM

## 2017-10-15 DIAGNOSIS — G9349 Other encephalopathy: Secondary | ICD-10-CM | POA: Diagnosis present

## 2017-10-15 DIAGNOSIS — Z515 Encounter for palliative care: Secondary | ICD-10-CM

## 2017-10-15 DIAGNOSIS — Z8249 Family history of ischemic heart disease and other diseases of the circulatory system: Secondary | ICD-10-CM

## 2017-10-15 DIAGNOSIS — R509 Fever, unspecified: Secondary | ICD-10-CM

## 2017-10-15 DIAGNOSIS — K7589 Other specified inflammatory liver diseases: Secondary | ICD-10-CM | POA: Diagnosis present

## 2017-10-15 DIAGNOSIS — Z978 Presence of other specified devices: Secondary | ICD-10-CM

## 2017-10-15 DIAGNOSIS — J189 Pneumonia, unspecified organism: Secondary | ICD-10-CM | POA: Diagnosis present

## 2017-10-15 DIAGNOSIS — I480 Paroxysmal atrial fibrillation: Secondary | ICD-10-CM | POA: Diagnosis present

## 2017-10-15 DIAGNOSIS — Z7189 Other specified counseling: Secondary | ICD-10-CM

## 2017-10-15 DIAGNOSIS — J9690 Respiratory failure, unspecified, unspecified whether with hypoxia or hypercapnia: Secondary | ICD-10-CM

## 2017-10-15 DIAGNOSIS — J9801 Acute bronchospasm: Secondary | ICD-10-CM | POA: Diagnosis not present

## 2017-10-15 DIAGNOSIS — R410 Disorientation, unspecified: Secondary | ICD-10-CM

## 2017-10-15 DIAGNOSIS — Z95810 Presence of automatic (implantable) cardiac defibrillator: Secondary | ICD-10-CM

## 2017-10-15 DIAGNOSIS — R7989 Other specified abnormal findings of blood chemistry: Secondary | ICD-10-CM

## 2017-10-15 DIAGNOSIS — R0602 Shortness of breath: Secondary | ICD-10-CM

## 2017-10-15 DIAGNOSIS — R21 Rash and other nonspecific skin eruption: Secondary | ICD-10-CM | POA: Diagnosis present

## 2017-10-15 DIAGNOSIS — Z794 Long term (current) use of insulin: Secondary | ICD-10-CM

## 2017-10-15 DIAGNOSIS — D649 Anemia, unspecified: Secondary | ICD-10-CM | POA: Diagnosis present

## 2017-10-15 DIAGNOSIS — I420 Dilated cardiomyopathy: Secondary | ICD-10-CM | POA: Diagnosis present

## 2017-10-15 DIAGNOSIS — A4151 Sepsis due to Escherichia coli [E. coli]: Principal | ICD-10-CM | POA: Diagnosis present

## 2017-10-15 DIAGNOSIS — Z91041 Radiographic dye allergy status: Secondary | ICD-10-CM

## 2017-10-15 DIAGNOSIS — E1122 Type 2 diabetes mellitus with diabetic chronic kidney disease: Secondary | ICD-10-CM | POA: Diagnosis present

## 2017-10-15 DIAGNOSIS — Z880 Allergy status to penicillin: Secondary | ICD-10-CM

## 2017-10-15 DIAGNOSIS — R748 Abnormal levels of other serum enzymes: Secondary | ICD-10-CM

## 2017-10-15 DIAGNOSIS — E1165 Type 2 diabetes mellitus with hyperglycemia: Secondary | ICD-10-CM | POA: Diagnosis not present

## 2017-10-15 DIAGNOSIS — N390 Urinary tract infection, site not specified: Secondary | ICD-10-CM | POA: Diagnosis present

## 2017-10-15 DIAGNOSIS — R112 Nausea with vomiting, unspecified: Secondary | ICD-10-CM

## 2017-10-15 DIAGNOSIS — D696 Thrombocytopenia, unspecified: Secondary | ICD-10-CM | POA: Diagnosis present

## 2017-10-15 DIAGNOSIS — I5023 Acute on chronic systolic (congestive) heart failure: Secondary | ICD-10-CM | POA: Diagnosis present

## 2017-10-15 DIAGNOSIS — R443 Hallucinations, unspecified: Secondary | ICD-10-CM | POA: Diagnosis not present

## 2017-10-15 DIAGNOSIS — N170 Acute kidney failure with tubular necrosis: Secondary | ICD-10-CM | POA: Diagnosis present

## 2017-10-15 DIAGNOSIS — Z7951 Long term (current) use of inhaled steroids: Secondary | ICD-10-CM

## 2017-10-15 DIAGNOSIS — J9601 Acute respiratory failure with hypoxia: Secondary | ICD-10-CM | POA: Diagnosis present

## 2017-10-15 DIAGNOSIS — R140 Abdominal distension (gaseous): Secondary | ICD-10-CM

## 2017-10-15 DIAGNOSIS — N183 Chronic kidney disease, stage 3 (moderate): Secondary | ICD-10-CM | POA: Diagnosis present

## 2017-10-15 DIAGNOSIS — Z888 Allergy status to other drugs, medicaments and biological substances status: Secondary | ICD-10-CM

## 2017-10-15 DIAGNOSIS — K7200 Acute and subacute hepatic failure without coma: Secondary | ICD-10-CM | POA: Diagnosis present

## 2017-10-15 DIAGNOSIS — Z7984 Long term (current) use of oral hypoglycemic drugs: Secondary | ICD-10-CM

## 2017-10-15 DIAGNOSIS — Z4659 Encounter for fitting and adjustment of other gastrointestinal appliance and device: Secondary | ICD-10-CM

## 2017-10-15 DIAGNOSIS — R778 Other specified abnormalities of plasma proteins: Secondary | ICD-10-CM

## 2017-10-15 DIAGNOSIS — N179 Acute kidney failure, unspecified: Secondary | ICD-10-CM

## 2017-10-15 DIAGNOSIS — R451 Restlessness and agitation: Secondary | ICD-10-CM | POA: Diagnosis present

## 2017-10-15 DIAGNOSIS — E874 Mixed disorder of acid-base balance: Secondary | ICD-10-CM | POA: Diagnosis present

## 2017-10-15 DIAGNOSIS — E8809 Other disorders of plasma-protein metabolism, not elsewhere classified: Secondary | ICD-10-CM | POA: Diagnosis present

## 2017-10-15 DIAGNOSIS — K761 Chronic passive congestion of liver: Secondary | ICD-10-CM | POA: Diagnosis present

## 2017-10-15 DIAGNOSIS — I130 Hypertensive heart and chronic kidney disease with heart failure and stage 1 through stage 4 chronic kidney disease, or unspecified chronic kidney disease: Secondary | ICD-10-CM | POA: Diagnosis present

## 2017-10-15 DIAGNOSIS — Z8673 Personal history of transient ischemic attack (TIA), and cerebral infarction without residual deficits: Secondary | ICD-10-CM

## 2017-10-15 DIAGNOSIS — K5150 Left sided colitis without complications: Secondary | ICD-10-CM | POA: Diagnosis present

## 2017-10-15 DIAGNOSIS — I472 Ventricular tachycardia: Secondary | ICD-10-CM | POA: Diagnosis present

## 2017-10-15 LAB — COMPREHENSIVE METABOLIC PANEL
ALBUMIN: 3.1 g/dL — AB (ref 3.5–5.0)
ALT: 19 U/L (ref 0–44)
ANION GAP: 8 (ref 5–15)
AST: 50 U/L — ABNORMAL HIGH (ref 15–41)
Alkaline Phosphatase: 51 U/L (ref 38–126)
BUN: 20 mg/dL (ref 8–23)
CHLORIDE: 103 mmol/L (ref 98–111)
CO2: 24 mmol/L (ref 22–32)
CREATININE: 1.32 mg/dL — AB (ref 0.61–1.24)
Calcium: 8.4 mg/dL — ABNORMAL LOW (ref 8.9–10.3)
GFR calc Af Amer: >60 mL/min (ref >60)
GFR calc non Af Amer: 55 mL/min — ABNORMAL LOW (ref >60)
GLUCOSE: 198 mg/dL — AB (ref 70–99)
Potassium: 3.6 mmol/L (ref 3.5–5.1)
SODIUM: 135 mmol/L (ref 135–145)
Total Bilirubin: 2.9 mg/dL — ABNORMAL HIGH (ref 0.3–1.2)
Total Protein: 6 g/dL — ABNORMAL LOW (ref 6.5–8.1)

## 2017-10-15 LAB — CBC WITH DIFFERENTIAL/PLATELET
Basophils Absolute: 0 10*3/uL (ref 0–0.1)
Basophils Relative: 0 %
Eosinophils Absolute: 0 10*3/uL (ref 0–0.7)
Eosinophils Relative: 0 %
HCT: 33.2 % — ABNORMAL LOW (ref 40.0–52.0)
HEMOGLOBIN: 11.2 g/dL — AB (ref 13.0–18.0)
LYMPHS ABS: 0.6 10*3/uL — AB (ref 1.0–3.6)
LYMPHS PCT: 8 %
MCH: 29.3 pg (ref 26.0–34.0)
MCHC: 33.8 g/dL (ref 32.0–36.0)
MCV: 86.6 fL (ref 80.0–100.0)
Monocytes Absolute: 0.7 10*3/uL (ref 0.2–1.0)
Monocytes Relative: 8 %
NEUTROS PCT: 84 %
Neutro Abs: 6.6 10*3/uL — ABNORMAL HIGH (ref 1.4–6.5)
Platelets: 113 10*3/uL — ABNORMAL LOW (ref 150–440)
RBC: 3.83 MIL/uL — AB (ref 4.40–5.90)
RDW: 17.2 % — ABNORMAL HIGH (ref 11.5–14.5)
WBC: 8 10*3/uL (ref 3.8–10.6)

## 2017-10-15 LAB — TROPONIN I: Troponin I: 0.14 ng/mL (ref <0.03)

## 2017-10-15 LAB — LACTIC ACID, PLASMA: LACTIC ACID, VENOUS: 1.2 mmol/L (ref 0.5–1.9)

## 2017-10-15 MED ORDER — DOXYCYCLINE HYCLATE 100 MG PO TABS
100.0000 mg | ORAL_TABLET | Freq: Once | ORAL | Status: AC
Start: 2017-10-15 — End: 2017-10-15
  Administered 2017-10-15: 100 mg via ORAL
  Filled 2017-10-15: qty 1

## 2017-10-15 MED ORDER — ACETAMINOPHEN 500 MG PO TABS
1000.0000 mg | ORAL_TABLET | Freq: Once | ORAL | Status: AC
  Administered 2017-10-15: 1000 mg via ORAL
  Filled 2017-10-15: qty 2

## 2017-10-15 MED ORDER — ASPIRIN 81 MG PO CHEW
324.0000 mg | CHEWABLE_TABLET | Freq: Once | ORAL | Status: AC
  Administered 2017-10-15: 324 mg via ORAL
  Filled 2017-10-15: qty 4

## 2017-10-15 NOTE — ED Provider Notes (Signed)
[facility] Emergency Department Provider Note    First MD Initiated Contact with Patient 10/15/17 2000     (approximate)  I have reviewed the triage vital signs and the nursing notes.   HISTORY  Chief Complaint Shortness of Breath and Nausea    HPI Derek Barnes is a 67 y.o. male with a history of CKD as well as congestive heart failure with severe decreased EF nonischemic cardiomyopathy presents to the ER for evaluation of fever and feeling "sick all day "associated with nausea.  Patient also thinks that he had his pacemaker fire today.  Is never done that before.  Patient denies any abdominal pain.  Has been feeling [place] and chills at home.  Not been on any antibiotics.  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  Does endorse generalized malaise.  No recent antibiotic use.  No cough.    Past Medical History:  Diagnosis Date  . CHF (congestive heart failure) (HCC)   . Chronic kidney disease (CKD)   . Colitis   . Decreased cardiac ejection fraction    EF 5-10%  . Dysrhythmia   . Hypertension   . Liver abscess   . Myocardial infarct (HCC)   . Nonischemic cardiomyopathy (HCC)    Family History  Problem Relation Age of Onset  . Cardiomyopathy Mother   . Prostate cancer Neg Hx   . Bladder Cancer Neg Hx   . Kidney cancer Neg Hx    Past Surgical History:  Procedure Laterality Date  . ACD DEFIBRILLATOR    . COLONOSCOPY    . COLONOSCOPY WITH PROPOFOL N/A 01/23/2017   Procedure: COLONOSCOPY WITH PROPOFOL;  Surgeon: Toledo, Boykin Nearing, MD;  Location: [facility] ENDOSCOPY;  Service: Gastroenterology;  Laterality: N/A;  . FRACTURE SURGERY    . HERNIA REPAIR    . INSERT / REPLACE / REMOVE PACEMAKER    . KNEE ARTHROSCOPY    . SHOULDER ARTHROSCOPY     Patient Active Problem List   Diagnosis Date Noted  . Stroke (cerebrum) (HCC) 04/01/2017  . Ventricular tachycardia (HCC) 03/30/2017    . Arrhythmia   . Elevated troponin   . Chronic combined systolic and diastolic heart failure (HCC)   . Rectal bleed 03/29/2017      Prior to Admission medications   Medication Sig Start Date End Date Taking? Authorizing Provider  amiodarone (PACERONE) 200 MG tablet Take 200 mg by mouth 2 (two) times daily.   Yes [provider]  atorvastatin (LIPITOR) 40 MG tablet Take 40 mg by mouth 2 (two) times daily.    Yes [provider]  Budesonide ER 9 MG TB24 Take 1 tablet by mouth daily. 03/31/17  Yes Wieting, Richard, MD  bumetanide (BUMEX) 1 MG tablet Take 1 mg by mouth daily.   Yes [provider]  digoxin (LANOXIN) 0.125 MG tablet Take 1 tablet by mouth daily.    Yes [provider]  eplerenone (INSPRA) 50 MG tablet Take 50 mg by mouth daily.   Yes [provider]  isosorbide mononitrate (IMDUR) 30 MG 24 hr tablet Take 30 mg by mouth daily.   Yes [provider]  mesalamine (LIALDA) 1.2 g EC tablet Take 2 tablets (2.4 g total) by mouth daily with breakfast. Patient taking differently: Take 4.8 g by mouth 2 (two) times daily.  04/01/17  Yes Alford Highland, MD  metFORMIN (GLUCOPHAGE)  500 MG tablet Take 500 mg by mouth 2 (two) times daily with a meal.    Yes [provider]  metoprolol succinate (TOPROL-XL) 50 MG 24 hr tablet Take 50 mg by mouth daily. Take with or immediately following a meal.   Yes [provider]  nitroGLYCERIN (NITROSTAT) 0.4 MG SL tablet Place 1 tablet under the tongue every 5 (five) minutes as needed. 01/19/17  Yes [provider]  sacubitril-valsartan (ENTRESTO) 24-26 MG Take 1 tablet by mouth 2 (two) times daily.   Yes [provider]  tamsulosin (FLOMAX) 0.4 MG CAPS capsule Take 1 capsule (0.4 mg total) by mouth daily. 03/19/17  Yes Schaevitz, Myra Rude, MD  insulin aspart (NOVOLOG) 100 UNIT/ML injection Inject 0-15 Units into the skin 3 (three) times daily before meals.     [provider]    Allergies Contrast media [iodinated diagnostic agents]; Penicillins; and Sotalol    Social History Social History   Tobacco Use  . Smoking status: Never Smoker  . Smokeless tobacco: Never Used  Substance Use Topics  . Alcohol use: No  . Drug use: No    Review of Systems Patient denies headaches, rhinorrhea, blurry vision, numbness, shortness of breath, chest pain, edema, cough, abdominal pain, nausea, vomiting, diarrhea, dysuria, fevers, rashes or hallucinations unless otherwise stated above in HPI. ____________________________________________   PHYSICAL EXAM:  VITAL SIGNS: Vitals:   10/15/17 2100 10/15/17 2112  BP: 104/63 104/63  Pulse: 96 93  Resp: (!) 26 18  Temp:    SpO2: 96% 97%    Constitutional: Alert and oriented, ill appearing.  Eyes: Conjunctivae are normal.  Head: Atraumatic. Nose: No congestion/rhinnorhea. Mouth/Throat: Mucous membranes are moist.   Neck: No stridor. Painless ROM.  Cardiovascular: Normal rate, regular rhythm. Grossly normal heart sounds.  Good peripheral circulation. Respiratory: Normal respiratory effort.  No retractions. Lungs CTAB. Gastrointestinal: Soft and nontender. No distention. No abdominal bruits. No CVA tenderness. Genitourinary: deferred Musculoskeletal: No lower extremity tenderness nor edema.  No joint effusions. Neurologic:  Normal speech and language. No gross focal neurologic deficits are appreciated. No facial droop Skin:  Skin is warm, dry and intact.  Psychiatric: Mood and affect are normal. Speech and behavior are normal.  ____________________________________________   LABS (all labs ordered are listed, but only abnormal results are displayed)  Results for orders placed or performed during the hospital encounter of 10/15/17 (from the past 24 hour(s))  Comprehensive metabolic panel     Status: Abnormal   Collection Time: 10/15/17  8:10 PM  Result Value Ref Range   Sodium 135 135 -  145 mmol/L   Potassium 3.6 3.5 - 5.1 mmol/L   Chloride 103 98 - 111 mmol/L   CO2 24 22 - 32 mmol/L   Glucose, Bld 198 (H) 70 - 99 mg/dL   BUN 20 8 - 23 mg/dL   Creatinine, Ser 1.61 (H) 0.61 - 1.24 mg/dL   Calcium 8.4 (L) 8.9 - 10.3 mg/dL   Total Protein 6.0 (L) 6.5 - 8.1 g/dL   Albumin 3.1 (L) 3.5 - 5.0 g/dL   AST 50 (H) 15 - 41 U/L   ALT 19 0 - 44 U/L   Alkaline Phosphatase 51 38 - 126 U/L   Total Bilirubin 2.9 (H) 0.3 - 1.2 mg/dL   GFR calc non Af Amer 55 (L) >60 mL/min   GFR calc Af Amer >60 >60 mL/min   Anion gap 8 5 - 15  Troponin I     Status: Abnormal  Collection Time: 10/15/17  8:10 PM  Result Value Ref Range   Troponin I 0.14 (HH) <0.03 ng/mL  CBC WITH DIFFERENTIAL     Status: Abnormal   Collection Time: 10/15/17  8:10 PM  Result Value Ref Range   WBC 8.0 3.8 - 10.6 K/uL   RBC 3.83 (L) 4.40 - 5.90 MIL/uL   Hemoglobin 11.2 (L) 13.0 - 18.0 g/dL   HCT 16.1 (L) 09.6 - 04.5 %   MCV 86.6 80.0 - 100.0 fL   MCH 29.3 26.0 - 34.0 pg   MCHC 33.8 32.0 - 36.0 g/dL   RDW 40.9 (H) 81.1 - 91.4 %   Platelets 113 (L) 150 - 440 K/uL   Neutrophils Relative % 84 %   Neutro Abs 6.6 (H) 1.4 - 6.5 K/uL   Lymphocytes Relative 8 %   Lymphs Abs 0.6 (L) 1.0 - 3.6 K/uL   Monocytes Relative 8 %   Monocytes Absolute 0.7 0.2 - 1.0 K/uL   Eosinophils Relative 0 %   Eosinophils Absolute 0.0 0 - 0.7 K/uL   Basophils Relative 0 %   Basophils Absolute 0.0 0 - 0.1 K/uL  Lipase, blood     Status: None   Collection Time: 10/15/17  8:10 PM  Result Value Ref Range   Lipase 26 11 - 51 U/L  Protime-INR     Status: Abnormal   Collection Time: 10/15/17  8:10 PM  Result Value Ref Range   Prothrombin Time 15.6 (H) 11.4 - 15.2 seconds   INR 1.25   APTT     Status: None   Collection Time: 10/15/17  8:10 PM  Result Value Ref Range   aPTT 32 24 - 36 seconds  Lactic acid, plasma     Status: None   Collection Time: 10/15/17  8:18 PM  Result Value Ref Range   Lactic Acid, Venous 1.2 0.5 - 1.9 mmol/L    ____________________________________________  EKG My review and personal interpretation at Time: 20:00   Indication: sepsis  Rate: 105  Rhythm: a-sensed v paced Axis: left Other: paced rhythm ____________________________________________  RADIOLOGY  I personally reviewed all radiographic images ordered to evaluate for the above acute complaints and reviewed radiology reports and findings.  These findings were personally discussed with the patient.  Please see medical record for radiology report.  ____________________________________________   PROCEDURES  Procedure(s) performed:  .Critical Care Performed by: Willy Eddy, MD Authorized by: Willy Eddy, MD   Critical care provider statement:    Critical care time (minutes):  40   Critical care time was exclusive of:  Separately billable procedures and treating other patients   Critical care was necessary to treat or prevent imminent or life-threatening deterioration of the following conditions:  Cardiac failure   Critical care was time spent personally by me on the following activities:  Development of treatment plan with patient or surrogate, discussions with consultants, evaluation of patient's response to treatment, examination of patient, obtaining history from patient or surrogate, ordering and performing treatments and interventions, ordering and review of laboratory studies, ordering and review of radiographic studies, pulse oximetry, re-evaluation of patient's condition and review of old charts      Critical Care performed: no ____________________________________________   INITIAL IMPRESSION / ASSESSMENT AND PLAN / ED COURSE  Pertinent labs & imaging results that were available during my care of the patient were reviewed by me and considered in my medical decision making (see chart for details).   DDX: sepsis, pna, chf, copd, cholecystitis, cholelithiasis,  enteritis, rmsf, bacteremia  Derek Barnes is a  67 y.o. who presents to the ED with symptoms as described above.  Patient ill-appearing but protecting his airway no hypoxia.  He is febrile.  No abdominal tenderness on initial exam.  EKG shows paced rhythm.  Certainly concerning for pericarditis or underlying cardiac issue but given the fever also concerning for sepsis.  Does endorse tick bites concerning for possible tickborne illness.  Blood work will be sent for sepsis work-up.  The patient will be placed on continuous pulse oximetry and telemetry for monitoring.  Laboratory evaluation will be sent to evaluate for the above complaints.     Clinical Course as of Oct 16 5  Tue Oct 15, 2017  2136 Patient does have mildly elevated bilirubin.  Given his nausea and fever will order right upper quadrant ultrasound.  Repeat abdominal exam is benign at this time.  May be secondary to vascular congestion.  He has no leukocytosis.  Troponin is elevated to 0.14.  Patient was having chest pain earlier and does not take aspirin or heparin.  He is currently hemodynamically stable on the monitor.  Will give full dose aspirin.  Patient will likely require heparinization but want to rule out other septic or infectious process.  Patient also reporting that he is frequently out in [place] woods and there is concern for tick bite therefore will cover for [facility] spotted fever with doxycycline.   [PR]  Wed Oct 16, 2017  0001 RUQ [place] there is no evidence of biliary pathology.  Repeat abdominal exam soft and benign.  At this point we will continue with doxycycline for concern for tickborne illness.  Based on his cardiac disease and elevated troponin with 2 defibrillation episodes today will heparinize admit to the hospital for further evaluation and management.   [PR]    Clinical Course User Index [PR] Willy Eddy, MD     As part of my medical decision making, I reviewed the following data within the electronic MEDICAL RECORD NUMBER Nursing notes reviewed and  incorporated, Labs reviewed, notes from prior ED visits  ____________________________________________   FINAL CLINICAL IMPRESSION(S) / ED DIAGNOSES  Final diagnoses:  Nausea & vomiting  Fever, unspecified fever cause  Palpitations  Elevated troponin I level      NEW MEDICATIONS STARTED DURING THIS VISIT:  New Prescriptions   No medications on file     Note:  This document was prepared using Dragon voice recognition software and may include unintentional dictation errors.    Willy Eddy, MD 10/16/17 [phone]

## 2017-10-15 NOTE — ED Triage Notes (Signed)
Arrived via EMS. "sick all day" pt states he had a close friends funeral yesterday and ever since he has been short of breath and nauseated. He has a pacemaker and felt it fire today which it has never done

## 2017-10-16 ENCOUNTER — Other Ambulatory Visit: Payer: Self-pay

## 2017-10-16 ENCOUNTER — Inpatient Hospital Stay: Admit: 2017-10-16 | Payer: BC Managed Care – PPO

## 2017-10-16 ENCOUNTER — Inpatient Hospital Stay: Payer: BC Managed Care – PPO

## 2017-10-16 ENCOUNTER — Inpatient Hospital Stay (HOSPITAL_COMMUNITY)
Admit: 2017-10-16 | Discharge: 2017-10-16 | Disposition: A | Discharge status: Home / Self Care | Payer: BC Managed Care – PPO | Attending: Internal Medicine | Admitting: Internal Medicine

## 2017-10-16 ENCOUNTER — Encounter: Payer: Self-pay | Admitting: Internal Medicine

## 2017-10-16 DIAGNOSIS — J810 Acute pulmonary edema: Secondary | ICD-10-CM | POA: Diagnosis not present

## 2017-10-16 DIAGNOSIS — I130 Hypertensive heart and chronic kidney disease with heart failure and stage 1 through stage 4 chronic kidney disease, or unspecified chronic kidney disease: Secondary | ICD-10-CM | POA: Diagnosis present

## 2017-10-16 DIAGNOSIS — J9801 Acute bronchospasm: Secondary | ICD-10-CM | POA: Diagnosis not present

## 2017-10-16 DIAGNOSIS — I472 Ventricular tachycardia: Secondary | ICD-10-CM | POA: Diagnosis present

## 2017-10-16 DIAGNOSIS — I252 Old myocardial infarction: Secondary | ICD-10-CM | POA: Diagnosis not present

## 2017-10-16 DIAGNOSIS — G9349 Other encephalopathy: Secondary | ICD-10-CM | POA: Diagnosis present

## 2017-10-16 DIAGNOSIS — N170 Acute kidney failure with tubular necrosis: Secondary | ICD-10-CM | POA: Diagnosis present

## 2017-10-16 DIAGNOSIS — I5023 Acute on chronic systolic (congestive) heart failure: Secondary | ICD-10-CM | POA: Diagnosis present

## 2017-10-16 DIAGNOSIS — E874 Mixed disorder of acid-base balance: Secondary | ICD-10-CM | POA: Diagnosis present

## 2017-10-16 DIAGNOSIS — K759 Inflammatory liver disease, unspecified: Secondary | ICD-10-CM | POA: Diagnosis not present

## 2017-10-16 DIAGNOSIS — N183 Chronic kidney disease, stage 3 (moderate): Secondary | ICD-10-CM | POA: Diagnosis present

## 2017-10-16 DIAGNOSIS — A4151 Sepsis due to Escherichia coli [E. coli]: Secondary | ICD-10-CM | POA: Diagnosis present

## 2017-10-16 DIAGNOSIS — I340 Nonrheumatic mitral (valve) insufficiency: Secondary | ICD-10-CM | POA: Diagnosis not present

## 2017-10-16 DIAGNOSIS — N390 Urinary tract infection, site not specified: Secondary | ICD-10-CM | POA: Diagnosis present

## 2017-10-16 DIAGNOSIS — R945 Abnormal results of liver function studies: Secondary | ICD-10-CM | POA: Diagnosis not present

## 2017-10-16 DIAGNOSIS — R509 Fever, unspecified: Secondary | ICD-10-CM | POA: Diagnosis not present

## 2017-10-16 DIAGNOSIS — K761 Chronic passive congestion of liver: Secondary | ICD-10-CM | POA: Diagnosis present

## 2017-10-16 DIAGNOSIS — R21 Rash and other nonspecific skin eruption: Secondary | ICD-10-CM | POA: Diagnosis present

## 2017-10-16 DIAGNOSIS — Z7189 Other specified counseling: Secondary | ICD-10-CM | POA: Diagnosis not present

## 2017-10-16 DIAGNOSIS — R7989 Other specified abnormal findings of blood chemistry: Secondary | ICD-10-CM | POA: Diagnosis present

## 2017-10-16 DIAGNOSIS — I420 Dilated cardiomyopathy: Secondary | ICD-10-CM

## 2017-10-16 DIAGNOSIS — J9601 Acute respiratory failure with hypoxia: Secondary | ICD-10-CM

## 2017-10-16 DIAGNOSIS — R443 Hallucinations, unspecified: Secondary | ICD-10-CM | POA: Diagnosis not present

## 2017-10-16 DIAGNOSIS — E1165 Type 2 diabetes mellitus with hyperglycemia: Secondary | ICD-10-CM | POA: Diagnosis not present

## 2017-10-16 DIAGNOSIS — R748 Abnormal levels of other serum enzymes: Secondary | ICD-10-CM | POA: Diagnosis not present

## 2017-10-16 DIAGNOSIS — Z515 Encounter for palliative care: Secondary | ICD-10-CM | POA: Diagnosis not present

## 2017-10-16 DIAGNOSIS — K7589 Other specified inflammatory liver diseases: Secondary | ICD-10-CM | POA: Diagnosis present

## 2017-10-16 DIAGNOSIS — E1122 Type 2 diabetes mellitus with diabetic chronic kidney disease: Secondary | ICD-10-CM | POA: Diagnosis present

## 2017-10-16 DIAGNOSIS — B179 Acute viral hepatitis, unspecified: Secondary | ICD-10-CM | POA: Diagnosis not present

## 2017-10-16 DIAGNOSIS — R410 Disorientation, unspecified: Secondary | ICD-10-CM | POA: Diagnosis not present

## 2017-10-16 DIAGNOSIS — K5150 Left sided colitis without complications: Secondary | ICD-10-CM | POA: Diagnosis present

## 2017-10-16 DIAGNOSIS — J189 Pneumonia, unspecified organism: Secondary | ICD-10-CM | POA: Diagnosis present

## 2017-10-16 DIAGNOSIS — I480 Paroxysmal atrial fibrillation: Secondary | ICD-10-CM | POA: Diagnosis present

## 2017-10-16 DIAGNOSIS — I5042 Chronic combined systolic (congestive) and diastolic (congestive) heart failure: Secondary | ICD-10-CM

## 2017-10-16 DIAGNOSIS — N179 Acute kidney failure, unspecified: Secondary | ICD-10-CM | POA: Diagnosis not present

## 2017-10-16 DIAGNOSIS — K7200 Acute and subacute hepatic failure without coma: Secondary | ICD-10-CM | POA: Diagnosis present

## 2017-10-16 DIAGNOSIS — I509 Heart failure, unspecified: Secondary | ICD-10-CM | POA: Diagnosis present

## 2017-10-16 DIAGNOSIS — R002 Palpitations: Secondary | ICD-10-CM | POA: Diagnosis present

## 2017-10-16 LAB — BLOOD CULTURE ID PANEL (REFLEXED)
Acinetobacter baumannii: NOT DETECTED
CANDIDA KRUSEI: NOT DETECTED
CANDIDA PARAPSILOSIS: NOT DETECTED
CANDIDA TROPICALIS: NOT DETECTED
Candida albicans: NOT DETECTED
Candida glabrata: NOT DETECTED
ESCHERICHIA COLI: NOT DETECTED
Enterobacter cloacae complex: NOT DETECTED
Enterobacteriaceae species: NOT DETECTED
Enterococcus species: NOT DETECTED
Haemophilus influenzae: NOT DETECTED
KLEBSIELLA OXYTOCA: NOT DETECTED
KLEBSIELLA PNEUMONIAE: NOT DETECTED
Listeria monocytogenes: NOT DETECTED
Methicillin resistance: NOT DETECTED
Neisseria meningitidis: NOT DETECTED
PROTEUS SPECIES: NOT DETECTED
PSEUDOMONAS AERUGINOSA: NOT DETECTED
SERRATIA MARCESCENS: NOT DETECTED
STAPHYLOCOCCUS AUREUS BCID: NOT DETECTED
STAPHYLOCOCCUS SPECIES: DETECTED — AB
STREPTOCOCCUS PNEUMONIAE: NOT DETECTED
Streptococcus agalactiae: NOT DETECTED
Streptococcus pyogenes: NOT DETECTED
Streptococcus species: NOT DETECTED

## 2017-10-16 LAB — URINALYSIS, COMPLETE (UACMP) WITH MICROSCOPIC
BILIRUBIN URINE: NEGATIVE
GLUCOSE, UA: NEGATIVE mg/dL
KETONES UR: NEGATIVE mg/dL
Nitrite: NEGATIVE
PROTEIN: 100 mg/dL — AB
Specific Gravity, Urine: 1.026 (ref 1.005–1.030)
pH: 5 (ref 5.0–8.0)

## 2017-10-16 LAB — HEPARIN LEVEL (UNFRACTIONATED)
HEPARIN UNFRACTIONATED: 0.24 [IU]/mL — AB (ref 0.30–0.70)
Heparin Unfractionated: 0.3 IU/mL (ref 0.30–0.70)
Heparin Unfractionated: 0.29 IU/mL — ABNORMAL LOW (ref 0.30–0.70)

## 2017-10-16 LAB — CBC
HEMATOCRIT: 32 % — AB (ref 40.0–52.0)
HEMOGLOBIN: 10.8 g/dL — AB (ref 13.0–18.0)
MCH: 29.2 pg (ref 26.0–34.0)
MCHC: 33.9 g/dL (ref 32.0–36.0)
MCV: 86.4 fL (ref 80.0–100.0)
Platelets: 92 10*3/uL — ABNORMAL LOW (ref 150–440)
RBC: 3.7 MIL/uL — AB (ref 4.40–5.90)
RDW: 17.1 % — ABNORMAL HIGH (ref 11.5–14.5)
WBC: 4.3 10*3/uL (ref 3.8–10.6)

## 2017-10-16 LAB — BLOOD GAS, ARTERIAL
ACID-BASE DEFICIT: 9.7 mmol/L — AB (ref 0.0–2.0)
BICARBONATE: 15.9 mmol/L — AB (ref 20.0–28.0)
Drawn by: 187461
FIO2: 1
LHR: 24 {breaths}/min
MECHVT: 600 mL
Mechanical Rate: 24
O2 SAT: 88.8 %
PEEP/CPAP: 10 cmH2O
Patient temperature: 37
pCO2 arterial: 33 mmHg (ref 32.0–48.0)
pH, Arterial: 7.29 — ABNORMAL LOW (ref 7.350–7.450)
pO2, Arterial: 63 mmHg — ABNORMAL LOW (ref 83.0–108.0)

## 2017-10-16 LAB — MRSA PCR SCREENING: MRSA by PCR: NEGATIVE

## 2017-10-16 LAB — GLUCOSE, CAPILLARY
GLUCOSE-CAPILLARY: 110 mg/dL — AB (ref 70–99)
GLUCOSE-CAPILLARY: 112 mg/dL — AB (ref 70–99)
Glucose-Capillary: 106 mg/dL — ABNORMAL HIGH (ref 70–99)

## 2017-10-16 LAB — DIGOXIN LEVEL: DIGOXIN LVL: 0.4 ng/mL — AB (ref 0.8–2.0)

## 2017-10-16 LAB — PROTIME-INR
INR: 1.25
Prothrombin Time: 15.6 seconds — ABNORMAL HIGH (ref 11.4–15.2)

## 2017-10-16 LAB — LIPASE, BLOOD: Lipase: 26 U/L (ref 11–51)

## 2017-10-16 LAB — PREALBUMIN: PREALBUMIN: 13.1 mg/dL — AB (ref 18–38)

## 2017-10-16 LAB — TROPONIN I
TROPONIN I: 0.18 ng/mL — AB (ref <0.03)
TROPONIN I: 0.33 ng/mL — AB (ref <0.03)
Troponin I: 0.12 ng/mL (ref <0.03)

## 2017-10-16 LAB — POTASSIUM: POTASSIUM: 4.7 mmol/L (ref 3.5–5.1)

## 2017-10-16 LAB — APTT: APTT: 32 s (ref 24–36)

## 2017-10-16 LAB — TRIGLYCERIDES: Triglycerides: 65 mg/dL (ref <150)

## 2017-10-16 MED ORDER — FENTANYL CITRATE (PF) 100 MCG/2ML IJ SOLN
100.0000 ug | Freq: Once | INTRAMUSCULAR | Status: AC
  Administered 2017-10-16: 100 ug via INTRAVENOUS
  Filled 2017-10-16: qty 2

## 2017-10-16 MED ORDER — INSULIN ASPART 100 UNIT/ML ~~LOC~~ SOLN: 0.0000 [IU] | Freq: Every day | SUBCUTANEOUS | Status: DC

## 2017-10-16 MED ORDER — DOCUSATE SODIUM 50 MG/5ML PO LIQD
100.0000 mg | Freq: Two times a day (BID) | ORAL | Status: DC | PRN
  Filled 2017-10-16: qty 10

## 2017-10-16 MED ORDER — HEPARIN BOLUS VIA INFUSION
1300.0000 [IU] | Freq: Once | INTRAVENOUS | Status: AC
  Administered 2017-10-16: 1300 [IU] via INTRAVENOUS
  Filled 2017-10-16: qty 1300

## 2017-10-16 MED ORDER — BISACODYL 10 MG RE SUPP: 10.0000 mg | Freq: Every day | RECTAL | Status: DC | PRN

## 2017-10-16 MED ORDER — BISACODYL 5 MG PO TBEC: 5.0000 mg | DELAYED_RELEASE_TABLET | Freq: Every day | ORAL | Status: DC | PRN

## 2017-10-16 MED ORDER — NITROGLYCERIN 0.4 MG SL SUBL
0.4000 mg | SUBLINGUAL_TABLET | SUBLINGUAL | Status: DC | PRN
  Administered 2017-10-18: 0.4 mg via SUBLINGUAL
  Filled 2017-10-16: qty 1

## 2017-10-16 MED ORDER — IRBESARTAN 75 MG PO TABS
37.5000 mg | ORAL_TABLET | Freq: Every day | ORAL | Status: DC
  Filled 2017-10-16: qty 0.5

## 2017-10-16 MED ORDER — IPRATROPIUM-ALBUTEROL 0.5-2.5 (3) MG/3ML IN SOLN: 3.0000 mL | Freq: Four times a day (QID) | RESPIRATORY_TRACT | Status: DC

## 2017-10-16 MED ORDER — FUROSEMIDE 10 MG/ML IJ SOLN
40.0000 mg | Freq: Once | INTRAMUSCULAR | Status: AC
  Administered 2017-10-16: 40 mg via INTRAVENOUS
  Filled 2017-10-16: qty 4

## 2017-10-16 MED ORDER — ACETAMINOPHEN 325 MG PO TABS
650.0000 mg | ORAL_TABLET | Freq: Four times a day (QID) | ORAL | Status: DC | PRN
  Administered 2017-10-16: 650 mg via ORAL
  Filled 2017-10-16: qty 2

## 2017-10-16 MED ORDER — CEFTRIAXONE SODIUM 1 G IJ SOLR
INTRAMUSCULAR | Status: AC
  Filled 2017-10-16: qty 10

## 2017-10-16 MED ORDER — TAMSULOSIN HCL 0.4 MG PO CAPS
0.4000 mg | ORAL_CAPSULE | Freq: Every day | ORAL | Status: DC
  Filled 2017-10-16: qty 1

## 2017-10-16 MED ORDER — MORPHINE SULFATE (PF) 2 MG/ML IV SOLN
2.0000 mg | Freq: Once | INTRAVENOUS | Status: AC
  Administered 2017-10-16: 2 mg via INTRAVENOUS

## 2017-10-16 MED ORDER — FUROSEMIDE 10 MG/ML IJ SOLN
40.0000 mg | Freq: Once | INTRAMUSCULAR | Status: DC
  Filled 2017-10-16: qty 4

## 2017-10-16 MED ORDER — BUDESONIDE 3 MG PO CPEP
9.0000 mg | ORAL_CAPSULE | Freq: Every day | ORAL | Status: DC
  Filled 2017-10-16: qty 3

## 2017-10-16 MED ORDER — PROPOFOL 1000 MG/100ML IV EMUL
0.0000 ug/kg/min | INTRAVENOUS | Status: DC
  Administered 2017-10-16: 20 ug/kg/min via INTRAVENOUS
  Administered 2017-10-16 – 2017-10-17 (×2): 30 ug/kg/min via INTRAVENOUS
  Filled 2017-10-16 (×2): qty 100

## 2017-10-16 MED ORDER — DIGOXIN 125 MCG PO TABS
125.0000 ug | ORAL_TABLET | Freq: Every day | ORAL | Status: DC
  Filled 2017-10-16: qty 1

## 2017-10-16 MED ORDER — AMIODARONE HCL IN DEXTROSE 360-4.14 MG/200ML-% IV SOLN
30.0000 mg/h | INTRAVENOUS | Status: DC
  Administered 2017-10-16: 30 mg/h via INTRAVENOUS
  Filled 2017-10-16: qty 200

## 2017-10-16 MED ORDER — PROPOFOL 1000 MG/100ML IV EMUL
INTRAVENOUS | Status: AC
  Administered 2017-10-16: 20 ug/kg/min via INTRAVENOUS
  Filled 2017-10-16: qty 100

## 2017-10-16 MED ORDER — ISOSORBIDE MONONITRATE ER 30 MG PO TB24
30.0000 mg | ORAL_TABLET | Freq: Every day | ORAL | Status: DC
  Filled 2017-10-16: qty 1

## 2017-10-16 MED ORDER — METOPROLOL TARTRATE 25 MG PO TABS: 25.0000 mg | ORAL_TABLET | Freq: Two times a day (BID) | ORAL | Status: DC

## 2017-10-16 MED ORDER — CHLORHEXIDINE GLUCONATE 0.12% ORAL RINSE (MEDLINE KIT)
15.0000 mL | Freq: Two times a day (BID) | OROMUCOSAL | Status: DC
  Administered 2017-10-16 – 2017-10-19 (×5): 15 mL via OROMUCOSAL

## 2017-10-16 MED ORDER — AMIODARONE HCL 200 MG PO TABS: 200.0000 mg | ORAL_TABLET | Freq: Two times a day (BID) | ORAL | Status: DC

## 2017-10-16 MED ORDER — NITROGLYCERIN 0.3 MG/HR TD PT24
0.3000 mg | MEDICATED_PATCH | Freq: Every day | TRANSDERMAL | Status: DC
  Administered 2017-10-18 – 2017-10-24 (×6): 0.3 mg via TRANSDERMAL
  Filled 2017-10-16 (×10): qty 1

## 2017-10-16 MED ORDER — BUMETANIDE 1 MG PO TABS
1.0000 mg | ORAL_TABLET | Freq: Every day | ORAL | Status: DC
  Filled 2017-10-16: qty 1

## 2017-10-16 MED ORDER — IPRATROPIUM-ALBUTEROL 0.5-2.5 (3) MG/3ML IN SOLN: 3.0000 mL | RESPIRATORY_TRACT | Status: DC | PRN

## 2017-10-16 MED ORDER — SENNOSIDES-DOCUSATE SODIUM 8.6-50 MG PO TABS: 1.0000 | ORAL_TABLET | Freq: Every evening | ORAL | Status: DC | PRN

## 2017-10-16 MED ORDER — METOPROLOL TARTRATE 5 MG/5ML IV SOLN: 2.5000 mg | INTRAVENOUS | Status: DC | PRN

## 2017-10-16 MED ORDER — FAMOTIDINE IN NACL 20-0.9 MG/50ML-% IV SOLN
20.0000 mg | Freq: Two times a day (BID) | INTRAVENOUS | Status: DC
  Administered 2017-10-16 – 2017-10-17 (×3): 20 mg via INTRAVENOUS
  Filled 2017-10-16 (×3): qty 50

## 2017-10-16 MED ORDER — AMIODARONE HCL IN DEXTROSE 360-4.14 MG/200ML-% IV SOLN
INTRAVENOUS | Status: AC
  Filled 2017-10-16: qty 200

## 2017-10-16 MED ORDER — ORAL CARE MOUTH RINSE
15.0000 mL | OROMUCOSAL | Status: DC
  Administered 2017-10-16 – 2017-10-17 (×6): 15 mL via OROMUCOSAL

## 2017-10-16 MED ORDER — FENTANYL CITRATE (PF) 100 MCG/2ML IJ SOLN: 100.0000 ug | INTRAMUSCULAR | Status: DC | PRN

## 2017-10-16 MED ORDER — POTASSIUM CHLORIDE 20 MEQ/15ML (10%) PO SOLN
40.0000 meq | Freq: Two times a day (BID) | ORAL | Status: DC
  Filled 2017-10-16 (×3): qty 30

## 2017-10-16 MED ORDER — MESALAMINE 1.2 G PO TBEC
4.8000 g | DELAYED_RELEASE_TABLET | Freq: Two times a day (BID) | ORAL | Status: DC
  Filled 2017-10-16 (×2): qty 4

## 2017-10-16 MED ORDER — ONDANSETRON HCL 4 MG PO TABS: 4.0000 mg | ORAL_TABLET | Freq: Four times a day (QID) | ORAL | Status: DC | PRN

## 2017-10-16 MED ORDER — SODIUM CHLORIDE 0.9 % IV SOLN
100.0000 mg | Freq: Two times a day (BID) | INTRAVENOUS | Status: DC
  Administered 2017-10-16 – 2017-10-17 (×3): 100 mg via INTRAVENOUS
  Filled 2017-10-16 (×5): qty 100

## 2017-10-16 MED ORDER — VANCOMYCIN HCL IN DEXTROSE 1-5 GM/200ML-% IV SOLN
1000.0000 mg | Freq: Two times a day (BID) | INTRAVENOUS | Status: DC
  Administered 2017-10-17: 1000 mg via INTRAVENOUS
  Filled 2017-10-16 (×2): qty 200

## 2017-10-16 MED ORDER — SODIUM CHLORIDE 0.9 % IV SOLN
0.0000 ug/min | INTRAVENOUS | Status: DC
  Administered 2017-10-16 (×2): 100 ug/min via INTRAVENOUS
  Administered 2017-10-17: 75 ug/min via INTRAVENOUS
  Administered 2017-10-17: 15 ug/min via INTRAVENOUS
  Administered 2017-10-17: 75 ug/min via INTRAVENOUS
  Filled 2017-10-16 (×5): qty 4

## 2017-10-16 MED ORDER — POTASSIUM CHLORIDE CRYS ER 20 MEQ PO TBCR: 40.0000 meq | EXTENDED_RELEASE_TABLET | Freq: Two times a day (BID) | ORAL | Status: DC

## 2017-10-16 MED ORDER — AMIODARONE LOAD VIA INFUSION
150.0000 mg | Freq: Once | INTRAVENOUS | Status: AC
  Administered 2017-10-16: 150 mg via INTRAVENOUS
  Filled 2017-10-16: qty 83.34

## 2017-10-16 MED ORDER — FENTANYL CITRATE (PF) 100 MCG/2ML IJ SOLN
INTRAMUSCULAR | Status: AC
  Administered 2017-10-16: 100 ug via INTRAVENOUS
  Filled 2017-10-16: qty 2

## 2017-10-16 MED ORDER — ONDANSETRON HCL 4 MG/2ML IJ SOLN: 4.0000 mg | Freq: Four times a day (QID) | INTRAMUSCULAR | Status: DC | PRN

## 2017-10-16 MED ORDER — ROCURONIUM BROMIDE 50 MG/5ML IV SOLN
50.0000 mg | Freq: Once | INTRAVENOUS | Status: AC
  Administered 2017-10-16: 50 mg via INTRAVENOUS

## 2017-10-16 MED ORDER — METOPROLOL SUCCINATE ER 50 MG PO TB24
50.0000 mg | ORAL_TABLET | Freq: Every day | ORAL | Status: DC
  Filled 2017-10-16: qty 1

## 2017-10-16 MED ORDER — HEPARIN (PORCINE) IN NACL 100-0.45 UNIT/ML-% IJ SOLN
1300.0000 [IU]/h | INTRAMUSCULAR | Status: DC
  Administered 2017-10-16: 4000 [IU]/h via INTRAVENOUS
  Administered 2017-10-16: 1000 [IU]/h via INTRAVENOUS
  Administered 2017-10-16: 1150 [IU]/h via INTRAVENOUS
  Administered 2017-10-17: 1300 [IU]/h via INTRAVENOUS
  Filled 2017-10-16 (×3): qty 250

## 2017-10-16 MED ORDER — ASPIRIN 81 MG PO CHEW: 81.0000 mg | CHEWABLE_TABLET | Freq: Every day | ORAL | Status: DC

## 2017-10-16 MED ORDER — ETOMIDATE 2 MG/ML IV SOLN
INTRAVENOUS | Status: AC
  Administered 2017-10-16: 30 mg via INTRAVENOUS
  Filled 2017-10-16: qty 20

## 2017-10-16 MED ORDER — VANCOMYCIN HCL 10 G IV SOLR
1500.0000 mg | Freq: Once | INTRAVENOUS | Status: AC
  Administered 2017-10-16: 1500 mg via INTRAVENOUS
  Filled 2017-10-16: qty 1500

## 2017-10-16 MED ORDER — ACETAMINOPHEN 650 MG RE SUPP: 650.0000 mg | Freq: Four times a day (QID) | RECTAL | Status: DC | PRN

## 2017-10-16 MED ORDER — SPIRONOLACTONE 25 MG PO TABS
25.0000 mg | ORAL_TABLET | Freq: Every day | ORAL | Status: DC
Start: 2017-10-17 — End: 2017-10-17

## 2017-10-16 MED ORDER — SPIRONOLACTONE 25 MG PO TABS: 25.0000 mg | ORAL_TABLET | Freq: Every day | ORAL | Status: DC

## 2017-10-16 MED ORDER — PERFLUTREN LIPID MICROSPHERE
1.0000 mL | INTRAVENOUS | Status: AC | PRN
  Administered 2017-10-16: 2 mL via INTRAVENOUS
  Filled 2017-10-16: qty 10

## 2017-10-16 MED ORDER — IPRATROPIUM-ALBUTEROL 0.5-2.5 (3) MG/3ML IN SOLN
RESPIRATORY_TRACT | Status: AC
  Administered 2017-10-16: 1 mL
  Filled 2017-10-16: qty 3

## 2017-10-16 MED ORDER — HEPARIN BOLUS VIA INFUSION
4000.0000 [IU] | Freq: Once | INTRAVENOUS | Status: AC
  Administered 2017-10-16: 4000 [IU] via INTRAVENOUS
  Filled 2017-10-16: qty 4000

## 2017-10-16 MED ORDER — SODIUM CHLORIDE 0.9 % IV SOLN
1.0000 g | INTRAVENOUS | Status: DC
  Administered 2017-10-16 – 2017-10-17 (×2): 1 g via INTRAVENOUS
  Filled 2017-10-16: qty 1

## 2017-10-16 MED ORDER — ROCURONIUM BROMIDE 50 MG/5ML IV SOLN
INTRAVENOUS | Status: AC
  Administered 2017-10-16: 50 mg via INTRAVENOUS
  Filled 2017-10-16: qty 1

## 2017-10-16 MED ORDER — FENTANYL CITRATE (PF) 100 MCG/2ML IJ SOLN
100.0000 ug | INTRAMUSCULAR | Status: DC | PRN
  Administered 2017-10-16: 100 ug via INTRAVENOUS
  Filled 2017-10-16: qty 2

## 2017-10-16 MED ORDER — AMIODARONE HCL IN DEXTROSE 360-4.14 MG/200ML-% IV SOLN
60.0000 mg/h | INTRAVENOUS | Status: AC
  Administered 2017-10-16 (×2): 60 mg/h via INTRAVENOUS
  Filled 2017-10-16: qty 200

## 2017-10-16 MED ORDER — MORPHINE SULFATE (PF) 2 MG/ML IV SOLN
2.0000 mg | INTRAVENOUS | Status: DC | PRN
  Filled 2017-10-16: qty 1

## 2017-10-16 MED ORDER — ATORVASTATIN CALCIUM 20 MG PO TABS
40.0000 mg | ORAL_TABLET | Freq: Two times a day (BID) | ORAL | Status: DC
  Filled 2017-10-16: qty 2

## 2017-10-16 MED ORDER — POTASSIUM CHLORIDE 10 MEQ/100ML IV SOLN
10.0000 meq | INTRAVENOUS | Status: AC
  Administered 2017-10-16 (×4): 10 meq via INTRAVENOUS
  Filled 2017-10-16 (×4): qty 100

## 2017-10-16 MED ORDER — PROPOFOL 1000 MG/100ML IV EMUL: 0.0000 ug/kg/min | INTRAVENOUS | Status: DC

## 2017-10-16 MED ORDER — HYDROXYZINE HCL 25 MG PO TABS
25.0000 mg | ORAL_TABLET | ORAL | Status: DC | PRN
  Filled 2017-10-16: qty 1

## 2017-10-16 MED ORDER — ASPIRIN 81 MG PO CHEW
81.0000 mg | CHEWABLE_TABLET | Freq: Every day | ORAL | Status: DC
  Filled 2017-10-16: qty 1

## 2017-10-16 MED ORDER — ETOMIDATE 2 MG/ML IV SOLN
30.0000 mg | Freq: Once | INTRAVENOUS | Status: AC
  Administered 2017-10-16: 30 mg via INTRAVENOUS

## 2017-10-16 MED ORDER — SACUBITRIL-VALSARTAN 24-26 MG PO TABS
1.0000 | ORAL_TABLET | Freq: Two times a day (BID) | ORAL | Status: DC
  Filled 2017-10-16: qty 1

## 2017-10-16 MED ORDER — FUROSEMIDE 10 MG/ML IJ SOLN
40.0000 mg | Freq: Two times a day (BID) | INTRAMUSCULAR | Status: DC
  Administered 2017-10-16 (×2): 40 mg via INTRAVENOUS
  Filled 2017-10-16 (×2): qty 4

## 2017-10-16 MED ORDER — INSULIN ASPART 100 UNIT/ML ~~LOC~~ SOLN: 0.0000 [IU] | Freq: Three times a day (TID) | SUBCUTANEOUS | Status: DC

## 2017-10-16 NOTE — ED Notes (Addendum)
Pt reports sudden onset SOB and generalized weakness. Lung sounds clear to auscultation at this time. Pt remains alert and oriented, 2 warm blankets given at this time. Will continue to monitor for further patient needs. Admitting MD paged.

## 2017-10-16 NOTE — Progress Notes (Signed)
ANTICOAGULATION CONSULT NOTE - Initial Consult  Pharmacy Consult for heparin drip Indication: chest pain/ACS  Allergies  Allergen Reactions  . Contrast Media [Iodinated Diagnostic Agents] Anaphylaxis  . Penicillins Anaphylaxis    Has patient had a PCN reaction causing immediate rash, facial/tongue/throat swelling, SOB or lightheadedness with hypotension: Yes Has patient had a PCN reaction causing severe rash involving mucus membranes or skin necrosis: Yes Has patient had a PCN reaction that required hospitalization: No Has patient had a PCN reaction occurring within the last 10 years: No If all of the above answers are "NO", then may proceed with Cephalosporin use.   . Sotalol Other (See Comments)    Excessive fatigue    Patient Measurements: Height: 5\' 8"  (172.7 cm) Weight: 190 lb 4.8 oz (86.3 kg) IBW/kg (Calculated) : 68.4 Heparin Dosing Weight: 87kg  Vital Signs: Temp: 100.8 F (38.2 C) (07/24 1915) Temp Source: Oral (07/24 1915) BP: 95/75 (07/24 1915) Pulse Rate: 72 (07/24 1900)  Labs: Recent Labs    10/15/17 2010 10/16/17 0754 10/16/17 1000 10/16/17 1612  HGB 11.2* 10.8*  --   --   HCT 33.2* 32.0*  --   --   PLT 113* 92*  --   --   APTT 32  --   --   --   LABPROT 15.6*  --   --   --   INR 1.25  --   --   --   HEPARINUNFRC  --  0.24*  --  0.30  CREATININE 1.32*  --   --   --   TROPONINI 0.14* 0.12* 0.18* 0.33*    Estimated Creatinine Clearance: 60.5 mL/min (A) (by C-G formula based on SCr of 1.32 mg/dL (H)).   Medical History: Past Medical History:  Diagnosis Date  . CHF (congestive heart failure) (HCC)   . Chronic kidney disease (CKD)   . Colitis   . Decreased cardiac ejection fraction    EF 5-10%  . Dysrhythmia   . Hypertension   . Liver abscess   . Myocardial infarct (HCC)   . Nonischemic cardiomyopathy (HCC)     Medications:  No anticoag in PTA meds  Assessment: Trop 0.14 Heparin is currently infusing at 1150 units/hr 7/24 16:12  Heparin level resulted at 0.33  Goal of Therapy:  Heparin level 0.3-0.7 units/ml Monitor platelets by anticoagulation protocol: Yes   Plan:  Will continue with current rate and check a confirmatory level in 6 hours. Daily CBC while on Heparin drip.  Clovia Cuff, PharmD, BCPS 10/16/2017 7:52 PM

## 2017-10-16 NOTE — Procedures (Signed)
PROCEDURE NOTE: CVL PLACEMENT  INDICATION:    Monitoring of central venous pressures and/or administration of medications optimally administered in central vein  CONSENT:   Risks of procedure as well as the alternatives were explained to the patient or surrogate. Consent for procedure obtained. A time out was performed.   PROCEDURE  Sterile technique was used including antiseptics, cap, gloves, gown, hand hygiene, mask and full body sheet.  Skin prep: Chlorhexidine; local anesthetic administered  A triple lumen catheter was placed in the R [place] vein using the Seldinger technique.   EVALUATION:  Blood flow good  Complications: No apparent complications  Patient tolerated the procedure well.  Chest X-ray ordered to verify placement and is pending   Billy Fischer, MD PCCM service Mobile [phone] 10/16/2017 3:06 PM

## 2017-10-16 NOTE — ED Notes (Signed)
This RN spoke with admitting MD. Updated on patient condition. No new orders received. Per Dr. Luberta Mutter will assess when patient arrives to the floor.

## 2017-10-16 NOTE — Progress Notes (Signed)
Chaplain responded to a RR which became a CB then cancelled. No family was present. Pt had just come from ED. Pt moved to ICU. Chaplain was not needed   10/16/17 1300  Clinical Encounter Type  Visited With Patient  Visit Type Code  Referral From Nurse  Spiritual Encounters  Spiritual Needs Prayer  .

## 2017-10-16 NOTE — ED Notes (Signed)
admitting Provider at bedside. 

## 2017-10-16 NOTE — Procedures (Signed)
Oral Intubation Procedure Note  Indications: Respiratory insufficiency Consent: Unable to obtain consent because of altered level of consciousness. Time Out: Verified patient identification, verified procedure, site/side was marked, verified correct patient position, special equipment/implants available, medications/allergies/relevent history reviewed, required imaging and test results available.   Pre-meds: Etomidate 30 mg IV  Neuromuscular blockade: Rocuronium 50 mg IV  Laryngoscope: #4 MAC  Visualization: cords partially visualized  ETT: 8.0 ETT passed on first attempt and secured @ 23 cm at upper incisors  Findings: normal airway   Evaluation:  Tube position confirmed by auscultation and EZCap CXR pending  Pt tolerated procedure well without complications   Merton Border, MD PCCM service Mobile [phone] Pager [phone] 10/16/2017 3:09 PM

## 2017-10-16 NOTE — H&P (Addendum)
[facility] [place] [facility]   PATIENT NAME: Derek Barnes    MR#:  [id number]  DATE OF BIRTH:  09-01-1950  DATE OF ADMISSION:  10/15/2017  PRIMARY CARE PHYSICIAN: Joanna Hews, MD   REQUESTING/REFERRING PHYSICIAN: Willy Eddy, MD  CHIEF COMPLAINT:   Chief Complaint  Patient presents with  . Shortness of Breath  . Nausea    HISTORY OF PRESENT ILLNESS:  Derek Barnes  is a 67 y.o. male with a known history of chronic systolic CHF (EF 88-41% as of 66/08/3014 Echo; s/p AICD/PPM), CKD III p/w fever, rash, AICD firing. Pt states that he developed an itchy rash all over his body ~2wks ago. 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 He states that he has been under a great deal of stress lately, as his friend was on hospice for the past 2wks, and passed away ~2d ago. Pt states he attended the funeral, and went home feeling poorly. He endorses fatigue/malaise/generalized weakness and increased sleep. 2d ago, he developed a fever, which he states has been coming & going since onset, though he has not measured his temperature. He endorses chills. He states he has been in bed for much of the past 2d. He states he got up to go to the bathroom, and developed chest tightness. As he was walking to the bathroom, he felt a sudden sensation in his chest, which he describes as being punched. He knew his AICD had fired. It has never fired before. As he was walking back to bed from the bathroom, the AICD fired again. He called his friend. His friend noted him to be pale and diaphoretic, and called EMS. Pt is comfortable and in no distress at the time of my assessment. He has a diffuse itchy maculopapular rash involving his entire body, including the trunk and all four limbs. Denies night sweats, rigors, HA/vertigo, blurred vision, AP/N/V/D, frank CP, palpitations, LH/LOC, urinary symptoms.  PAST MEDICAL HISTORY:   Past Medical History:  Diagnosis Date  .  CHF (congestive heart failure) (HCC)   . Chronic kidney disease (CKD)   . Colitis   . Decreased cardiac ejection fraction    EF 5-10%  . Dysrhythmia   . Hypertension   . Liver abscess   . Myocardial infarct (HCC)   . Nonischemic cardiomyopathy (HCC)     PAST SURGICAL HISTORY:   Past Surgical History:  Procedure Laterality Date  . ACD DEFIBRILLATOR    . COLONOSCOPY    . COLONOSCOPY WITH PROPOFOL N/A 01/23/2017   Procedure: COLONOSCOPY WITH PROPOFOL;  Surgeon: Toledo, Boykin Nearing, MD;  Location: [facility] ENDOSCOPY;  Service: Gastroenterology;  Laterality: N/A;  . FRACTURE SURGERY    . HERNIA REPAIR    . INSERT / REPLACE / REMOVE PACEMAKER    . KNEE ARTHROSCOPY    . SHOULDER ARTHROSCOPY      SOCIAL HISTORY:   Social History   Tobacco Use  . Smoking status: Never Smoker  . Smokeless tobacco: Never Used  Substance Use Topics  . Alcohol use: No    FAMILY HISTORY:   Family History  Problem Relation Age of Onset  . Cardiomyopathy Mother   . Prostate cancer Neg Hx   . Bladder Cancer Neg Hx   . Kidney cancer Neg Hx     DRUG ALLERGIES:   Allergies  Allergen Reactions  . Contrast Media [Iodinated Diagnostic Agents] Anaphylaxis  . Penicillins Anaphylaxis    Has patient had  a PCN reaction causing immediate rash, facial/tongue/throat swelling, SOB or lightheadedness with hypotension: Yes Has patient had a PCN reaction causing severe rash involving mucus membranes or skin necrosis: Yes Has patient had a PCN reaction that required hospitalization: No Has patient had a PCN reaction occurring within the last 10 years: No If all of the above answers are "NO", then may proceed with Cephalosporin use.   . Sotalol Other (See Comments)    Excessive fatigue    REVIEW OF SYSTEMS:   Review of Systems  Constitutional: Positive for chills, diaphoresis, fever and malaise/fatigue. Negative for weight loss.  HENT: Negative for congestion, ear pain, hearing loss, nosebleeds, sinus pain,  sore throat and tinnitus.   Eyes: Negative for blurred vision, double vision and photophobia.  Respiratory: Negative for cough, hemoptysis, sputum production, shortness of breath and wheezing.   Cardiovascular: Positive for chest pain (+) chest tightness as per HPI. Negative for palpitations, orthopnea, claudication, leg swelling and PND.  Gastrointestinal: Negative for abdominal pain, blood in stool, constipation, diarrhea, heartburn, melena, nausea and vomiting.  Genitourinary: Negative for dysuria, flank pain, frequency, hematuria and urgency.  Musculoskeletal: Negative for back pain, falls, joint pain, myalgias and neck pain.  Skin: Positive for itching and rash.  Neurological: Positive for weakness. Negative for dizziness, tingling, tremors, sensory change, speech change, focal weakness, seizures, loss of consciousness and headaches.  Psychiatric/Behavioral: Negative for memory loss. The patient does not have insomnia.    MEDICATIONS AT HOME:   Prior to Admission medications   Medication Sig Start Date End Date Taking? Authorizing Provider  amiodarone (PACERONE) 200 MG tablet Take 200 mg by mouth 2 (two) times daily.   Yes [provider]  atorvastatin (LIPITOR) 40 MG tablet Take 40 mg by mouth 2 (two) times daily.    Yes [provider]  Budesonide ER 9 MG TB24 Take 1 tablet by mouth daily. 03/31/17  Yes Wieting, Richard, MD  bumetanide (BUMEX) 1 MG tablet Take 1 mg by mouth daily.   Yes [provider]  digoxin (LANOXIN) 0.125 MG tablet Take 1 tablet by mouth daily.    Yes [provider]  eplerenone (INSPRA) 50 MG tablet Take 50 mg by mouth daily.   Yes [provider]  isosorbide mononitrate (IMDUR) 30 MG 24 hr tablet Take 30 mg by mouth daily.   Yes [provider]  mesalamine (LIALDA) 1.2 g EC tablet Take 2 tablets (2.4 g total) by mouth daily with breakfast. Patient taking differently: Take 4.8 g by mouth 2 (two) times daily.   04/01/17  Yes Wieting, Richard, MD  metFORMIN (GLUCOPHAGE) 500 MG tablet Take 500 mg by mouth 2 (two) times daily with a meal.    Yes [provider]  metoprolol succinate (TOPROL-XL) 50 MG 24 hr tablet Take 50 mg by mouth daily. Take with or immediately following a meal.   Yes [provider]  nitroGLYCERIN (NITROSTAT) 0.4 MG SL tablet Place 1 tablet under the tongue every 5 (five) minutes as needed. 01/19/17  Yes [provider]  sacubitril-valsartan (ENTRESTO) 24-26 MG Take 1 tablet by mouth 2 (two) times daily.   Yes [provider]  tamsulosin (FLOMAX) 0.4 MG CAPS capsule Take 1 capsule (0.4 mg total) by mouth daily. 03/19/17  Yes Schaevitz, Myra Rude, MD  insulin aspart (NOVOLOG) 100 UNIT/ML injection Inject 0-15 Units into the skin 3 (three) times daily before meals.    [provider]      VITAL SIGNS:  Blood  pressure 101/63, pulse 77, temperature 99 F (37.2 C), temperature source Oral, resp. rate 18, height 5\' 8"  (1.727 m), weight 90.7 kg (200 lb), SpO2 98 %.  PHYSICAL EXAMINATION:  Physical Exam  Constitutional: He is oriented to person, place, and time. He appears well-developed and well-nourished. He is active and cooperative.  Non-toxic appearance. No distress. He is not intubated.  HENT:  Head: Normocephalic and atraumatic.  Mouth/Throat: Oropharynx is clear and moist. No oropharyngeal exudate.  Eyes: Conjunctivae, EOM and lids are normal. No scleral icterus.  Neck: Neck supple. No JVD present. No thyromegaly present.  Cardiovascular: Normal rate, regular rhythm, S1 normal, S2 normal and normal heart sounds.  No extrasystoles are present. Exam reveals no gallop, no S3, no S4, no distant heart sounds and no friction rub.  No murmur heard. Pulmonary/Chest: Effort normal. No accessory muscle usage or stridor. No apnea, no tachypnea and no bradypnea. He is not intubated. No respiratory distress. He has no decreased breath sounds. He  has no wheezes. He has no rhonchi. He has no rales.  (+) AICD/PPM.  Abdominal: Soft. Bowel sounds are normal. He exhibits no distension. There is no tenderness. There is no rebound and no guarding.  Musculoskeletal: Normal range of motion. He exhibits no edema or tenderness.       Right lower leg: He exhibits no tenderness and no edema.       Left lower leg: He exhibits no tenderness and no edema.  Lymphadenopathy:    He has no cervical adenopathy.  Neurological: He is alert and oriented to person, place, and time. He is not disoriented.  Skin: Skin is warm and dry. Petechiae and rash noted. No ecchymosis noted. Rash is macular and papular. He is not diaphoretic. No erythema. No pallor.  Psychiatric: He has a normal mood and affect. His speech is normal and behavior is normal. Judgment and thought content normal. His mood appears not anxious. He is not agitated. Cognition and memory are normal.   LABORATORY PANEL:   CBC Recent Labs  Lab 10/15/17 2010  WBC 8.0  HGB 11.2*  HCT 33.2*  PLT 113*   ------------------------------------------------------------------------------------------------------------------  Chemistries  Recent Labs  Lab 10/15/17 2010  NA 135  K 3.6  CL 103  CO2 24  GLUCOSE 198*  BUN 20  CREATININE 1.32*  CALCIUM 8.4*  AST 50*  ALT 19  ALKPHOS 51  BILITOT 2.9*   ------------------------------------------------------------------------------------------------------------------  Cardiac Enzymes Recent Labs  Lab 10/15/17 2010  TROPONINI 0.14*   ------------------------------------------------------------------------------------------------------------------  RADIOLOGY:  Dg Chest 1 View  Result Date: 10/15/2017 CLINICAL DATA:  Possible retrocardiac opacity. EXAM: CHEST  1 VIEW COMPARISON:  Radiograph of same day. Radiographs of March 29, 2017. FINDINGS: Lateral projection only of the chest demonstrates no definite evidence of retrocardiac opacity.  Pacemaker is again noted. Visualized thoracic spine is unremarkable. IMPRESSION: No definite retrocardiac opacity seen on lateral projection only. Electronically Signed   By: Lupita Raider, M.D.   On: 10/15/2017 21:53   Dg Chest Portable 1 View  Result Date: 10/15/2017 CLINICAL DATA:  Fever EXAM: PORTABLE CHEST 1 VIEW COMPARISON:  03/29/2017, 03/13/2015 FINDINGS: Left-sided pacing device as before. Globular cardiomegaly without significant interval change. No pleural effusion or focal airspace disease. IMPRESSION: No active disease.  Stable cardiomegaly. Electronically Signed   By: Jasmine Pang M.D.   On: 10/15/2017 21:08   US Abdomen Limited Ruq  Result Date: 10/15/2017 CLINICAL DATA:  Nausea and vomiting EXAM: ULTRASOUND ABDOMEN LIMITED RIGHT UPPER QUADRANT  COMPARISON:  CT 08/06/2013 FINDINGS: Gallbladder: Slightly distended gallbladder but without shadowing stone. Small amount of gallbladder sludge. Negative sonographic Murphy. Normal wall thickness. Common bile duct: Diameter: 2.8 mm Liver: No focal lesion identified. Within normal limits in parenchymal echogenicity. Portal vein is patent on color Doppler imaging with normal direction of blood flow towards the liver. IMPRESSION: 1. Slight gallbladder distention with sludge but no other features to suggest acute cholecystitis. No biliary enlargement Electronically Signed   By: Jasmine Pang M.D.   On: 10/15/2017 23:53   IMPRESSION AND PLAN:   A/P: 67M fever/rash, AICD firing, UTI. 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 -Lyme, rickettsia/RMSF, Anaplasma/Ehrlichia/Babesia Ab testing -Plt 113, slightly lower than previous labwork (140-170 range in past) -ID consult -Doxycycline for  now -Benadryl/Atarax PRN itching -AICD fired twice; interrogation report pending -Tele, continuous cardiac monitoring -Trop-I 0.14, rpt pending; has Hx of Trop-I leak based on review of prior labs, though usually in the 0.03-0.08 range -ASA81 -c/w statin, beta blocker, Entresto, Imdur, Aldactone/Eplerenone, other cardiac meds -Heparin gtt -Cardiology consult -Digoxin level pending -Suspect pt volume depleted due to poor intake and insensible losses, but have not administered IVF due to severe CM (EF 20-25%); encourage PO fluids -Rpt Echo pending -Continuous pulse ox -U/A (+) UTI -Lactate 1.2 -BCx x2, UCx pending -Ceftriaxone -Cr 1.3, baseline 1.1, slight elevation, does not meet criteria for AKI -SSI, hold PO antihyperglycemics -Ionized calcium level pending -Prealbumin pending -AST mildly elevated to 50, < 2-3x ULN, c/w Statin -c/w home meds/formulary subs -Cardiac diabetic diet -Heparin gtt -Full code -Admission, > 2 midnights   All the records are reviewed and case discussed with ED provider. Management plans discussed with the patient, family and they are in agreement.  CODE STATUS: Full code  TOTAL TIME TAKING CARE OF THIS PATIENT: 90 minutes.    Barbaraann Rondo M.D on 10/16/2017 at 4:55 AM  Between 7am to 6pm - Pager - [phone]  After 6pm go to [email or web address] - Social research officer, government  [facility]  Office  [phone]  CC: Primary care physician; Joanna Hews, MD   Note: This dictation was prepared with Dragon dictation along with smaller phrase technology. Any transcriptional errors that result from this process are unintentional.

## 2017-10-16 NOTE — Progress Notes (Addendum)
ANTICOAGULATION CONSULT NOTE - Initial Consult  Pharmacy Consult for heparin drip Indication: chest pain/ACS  Allergies  Allergen Reactions  . Contrast Media [Iodinated Diagnostic Agents] Anaphylaxis  . Penicillins Anaphylaxis    Has patient had a PCN reaction causing immediate rash, facial/tongue/throat swelling, SOB or lightheadedness with hypotension: Yes Has patient had a PCN reaction causing severe rash involving mucus membranes or skin necrosis: Yes Has patient had a PCN reaction that required hospitalization: No Has patient had a PCN reaction occurring within the last 10 years: No If all of the above answers are "NO", then may proceed with Cephalosporin use.   . Sotalol Other (See Comments)    Excessive fatigue    Patient Measurements: Height: 5\' 8"  (172.7 cm) Weight: 190 lb 4.8 oz (86.3 kg) IBW/kg (Calculated) : 68.4 Heparin Dosing Weight: 87kg  Vital Signs: Temp: 98.9 F (37.2 C) (07/24 2200) Temp Source: Oral (07/24 2200) BP: 101/69 (07/24 2200) Pulse Rate: 73 (07/24 2200)  Labs: Recent Labs    10/15/17 2010 10/16/17 0754 10/16/17 1000 10/16/17 1612 10/16/17 2124  HGB 11.2* 10.8*  --   --   --   HCT 33.2* 32.0*  --   --   --   PLT 113* 92*  --   --   --   APTT 32  --   --   --   --   LABPROT 15.6*  --   --   --   --   INR 1.25  --   --   --   --   HEPARINUNFRC  --  0.24*  --  0.30 0.29*  CREATININE 1.32*  --   --   --   --   TROPONINI 0.14* 0.12* 0.18* 0.33*  --     Estimated Creatinine Clearance: 60.5 mL/min (A) (by C-G formula based on SCr of 1.32 mg/dL (H)).   Medical History: Past Medical History:  Diagnosis Date  . CHF (congestive heart failure) (HCC)   . Chronic kidney disease (CKD)   . Colitis   . Decreased cardiac ejection fraction    EF 5-10%  . Dysrhythmia   . Hypertension   . Liver abscess   . Myocardial infarct (HCC)   . Nonischemic cardiomyopathy (HCC)     Medications:  No anticoag in PTA meds  Assessment: Trop  0.14 Heparin is currently infusing at 1150 units/hr 7/24 16:12 Heparin level resulted at 0.30  Goal of Therapy:  Heparin level 0.3-0.7 units/ml Monitor platelets by anticoagulation protocol: Yes   Plan:  Will continue with current rate and check a confirmatory level in 6 hours. Daily CBC while on Heparin drip.  7/24 PM heparin level 0.29. 1300 unit bolus and increase rate to 1300 units/hr. Recheck in 6 hours.  7/25 0400 heparin level 0.31. Continue current regimen. Recheck in 6 hours to confirm.   Fulton Reek, PharmD, BCPS  10/17/17 5:39 AM

## 2017-10-16 NOTE — Significant Event (Signed)
I was called back to the bedside for persistent hypoxemia and severe respiratory distress despite noninvasive ventilatory support.  After evaluation, I decided that he required intubation.  He was intubated and central line was placed.  After intubation there was prolonged, mild hypoxemia.  He responds to recruitment maneuvers on the ventilator.  His chest x-ray shows worsening pulmonary edema pattern.  In addition, 1 of 2 blood cultures is positive for GPCs with methicillin resistance.  Given his critical illness and a presentation that was consistent with an infectious process, vancomycin has been initiated.  It is also notable that he has a PPM/AICD in place.  Administering the above care involved an additional 30 minutes of critical care medicine time independent of the time spent performing procedures.  Billy Fischer, MD PCCM service Mobile [phone] Pager [phone] 10/16/2017 4:00 PM

## 2017-10-16 NOTE — Progress Notes (Signed)
[facility] -  at [facility]   PATIENT NAME: Derek Barnes    MR#:  [id number]  DATE OF BIRTH:  Aug 11, 1950  SUBJECTIVE: Admitted this morning for fever.  Patient admitted to telemetry.  But he complained of shortness of breath, called rapid response because he was having wide-complex tachycardia on telemetry and also hypoxic needing oxygen 3 L to keep sats more than 90.  Because of worsening shortness of breath and also wide-complex tachycardia rapid response was called, myself and Dr. Darrol Angel attended, amiodarone bolus was given, transfer the patient to intensive care unit for BiPAP support patient received 1 dose of Lasix 40 mg 1 time.  CHIEF COMPLAINT:   Chief Complaint  Patient presents with  . Shortness of Breath  . Nausea  Patient was tachycardic with heart rate 140 bpm, respiratory rate of 40 this morning  REVIEW OF SYSTEMS:    Review of Systems  Constitutional: Positive for fever. Negative for chills.  HENT: Negative for hearing loss.   Eyes: Negative for blurred vision, double vision and photophobia.  Respiratory: Positive for cough and shortness of breath. Negative for hemoptysis.   Cardiovascular: Negative for palpitations, orthopnea and leg swelling.  Gastrointestinal: Negative for abdominal pain, diarrhea and vomiting.  Genitourinary: Negative for dysuria and urgency.  Musculoskeletal: Negative for myalgias and neck pain.  Skin: Negative for rash.  Neurological: Negative for dizziness, focal weakness, seizures, weakness and headaches.  Psychiatric/Behavioral: Negative for memory loss. The patient does not have insomnia.    Nutrition:  Tolerating Diet: Tolerating PT:      DRUG ALLERGIES:   Allergies  Allergen Reactions  . Contrast Media [Iodinated Diagnostic Agents] Anaphylaxis  . Penicillins Anaphylaxis    Has patient had a PCN reaction causing immediate rash, facial/tongue/throat swelling, SOB or lightheadedness with hypotension:  Yes Has patient had a PCN reaction causing severe rash involving mucus membranes or skin necrosis: Yes Has patient had a PCN reaction that required hospitalization: No Has patient had a PCN reaction occurring within the last 10 years: No If all of the above answers are "NO", then may proceed with Cephalosporin use.   . Sotalol Other (See Comments)    Excessive fatigue    VITALS:  Blood pressure (!) 88/57, pulse (!) 105, temperature 98.7 F (37.1 C), temperature source Axillary, resp. rate (!) 29, height 5\' 8"  (1.727 m), weight 86.3 kg (190 lb 4.8 oz), SpO2 98 %.  PHYSICAL EXAMINATION:   Physical Exam  GENERAL:  67 y.o.-year-old patient lying in the bed with obviousrespiratory distress. EYES: Pupils equal, round, reactive to light and accommodation. No scleral icterus.  HEENT: Head atraumatic, normocephalic. Oropharynx and nasopharynx clear.  NECK:  Supple, no jugular venous distention. No thyroid enlargement, no tenderness.  LUNGS: Bibasilar crepitations.  Marland Kitchen  CARDIOVASCULAR: S1, S2 tachycardic with heart rate 140 bpm. No murmurs, rubs, or gallops.  ABDOMEN: Soft, nontender, nondistended. Bowel sounds present. No organomegaly or mass.  EXTREMITIES: No pedal edema, cyanosis, or clubbing.  NEUROLOGIC we will to do full neurologic exam because patient is very short of breath and havingrespiratory distress.  PSYCHIATRIC: The patient is alert and oriented x 3.  During my visit SKIN: No obvious rash, lesion, or ulcer.    LABORATORY PANEL:   CBC Recent Labs  Lab 10/16/17 0754  WBC 4.3  HGB 10.8*  HCT 32.0*  PLT 92*   ------------------------------------------------------------------------------------------------------------------  Chemistries  Recent Labs  Lab 10/15/17 2010  NA 135  K 3.6  CL 103  CO2 24  GLUCOSE 198*  BUN 20  CREATININE 1.32*  CALCIUM 8.4*  AST 50*  ALT 19  ALKPHOS 51  BILITOT 2.9*    ------------------------------------------------------------------------------------------------------------------  Cardiac Enzymes Recent Labs  Lab 10/16/17 1000  TROPONINI 0.18*   ------------------------------------------------------------------------------------------------------------------  RADIOLOGY:  Dg Chest 1 View  Result Date: 10/15/2017 CLINICAL DATA:  Possible retrocardiac opacity. EXAM: CHEST  1 VIEW COMPARISON:  Radiograph of same day. Radiographs of March 29, 2017. FINDINGS: Lateral projection only of the chest demonstrates no definite evidence of retrocardiac opacity. Pacemaker is again noted. Visualized thoracic spine is unremarkable. IMPRESSION: No definite retrocardiac opacity seen on lateral projection only. Electronically Signed   By: Lupita Raider, M.D.   On: 10/15/2017 21:53   Dg Chest Port 1 View  Result Date: 10/16/2017 CLINICAL DATA:  67 year old male with shortness of breath, fever, rash, AICD firing. EXAM: PORTABLE CHEST 1 VIEW COMPARISON:  10/15/2017 and earlier. FINDINGS: Portable AP semi upright view at 1139 hours. Stable left chest AICD. Stable severe cardiomegaly. Other mediastinal contours are within normal limits. Visualized tracheal air column is within normal limits. Increased asymmetric indistinct right lung interstitial opacity. No superimposed pneumothorax, pleural effusion, or consolidation. The left lung appears stable and clear. Negative visible bowel gas pattern. No acute osseous abnormality identified. IMPRESSION: 1. Asymmetric increased right lung interstitial opacity could reflect asymmetric interstitial edema or viral/atypical respiratory infection. No pleural effusion identified. 2. Stable severe cardiomegaly. Electronically Signed   By: Odessa Fleming M.D.   On: 10/16/2017 12:00   Dg Chest Portable 1 View  Result Date: 10/15/2017 CLINICAL DATA:  Fever EXAM: PORTABLE CHEST 1 VIEW COMPARISON:  03/29/2017, 03/13/2015 FINDINGS: Left-sided pacing  device as before. Globular cardiomegaly without significant interval change. No pleural effusion or focal airspace disease. IMPRESSION: No active disease.  Stable cardiomegaly. Electronically Signed   By: Jasmine Pang M.D.   On: 10/15/2017 21:08   US Abdomen Limited Ruq  Result Date: 10/15/2017 CLINICAL DATA:  Nausea and vomiting EXAM: ULTRASOUND ABDOMEN LIMITED RIGHT UPPER QUADRANT COMPARISON:  CT 08/06/2013 FINDINGS: Gallbladder: Slightly distended gallbladder but without shadowing stone. Small amount of gallbladder sludge. Negative sonographic Murphy. Normal wall thickness. Common bile duct: Diameter: 2.8 mm Liver: No focal lesion identified. Within normal limits in parenchymal echogenicity. Portal vein is patent on color Doppler imaging with normal direction of blood flow towards the liver. IMPRESSION: 1. Slight gallbladder distention with sludge but no other features to suggest acute cholecystitis. No biliary enlargement Electronically Signed   By: Jasmine Pang M.D.   On: 10/15/2017 23:53     ASSESSMENT AND PLAN:   Active Problems:   FUO (fever of unknown origin)  67 year old male patient with history of atrial fibrillation, V. tach, status post AICD placement and follows with [facility] cardiology, patient also has diabetes mellitus type 2, CVA admitted for maculopapular rash and fever and AICD firing.  Patient had respiratory distress with tachycardia this morning. 1.  Acute respiratory distress secondary to acute pulmonary edema: Received 40 mg of IV Lasix one-time, transferred to stepdown unit for BiPAP support, patient has history of severe dilated cardiopathy, history of V. tach, patient received amiodarone loading/infusion, cardiology consult requested and I spoke to Dr. Juliann Pares. 2.  Atrial fibrillation: Patient is on amiodarone drip, heparin drip, follow echocardiogram, consult cardiology, cycle troponins, patient digoxin level is 0.4.  #3 acute renal failure patient has CKD stage III;  closely, while on  Diuretics.  #4 fever, UTI, patient also had maculopapular rash so he  is on Rocephin, doxycycline, blood cultures are showing gram-positive cocci in aerobic bottle only at this time, patient is encephalopathic at this time he was alert this morning but became confused and had severe respiratory distress, tachycardia requiring rapid response, please follow blood cultures, rickettsial fever antibiotics, Lyme titers, [facility] spotted spotted fever IgG, IgM levels, ID consult requested.  All the records are reviewed and case discussed with Care Management/Social Workerr. Management plans discussed with the patient, family and they are in agreement.  CODE STATUS:full  TOTAL TIME TAKING CARE OF THIS PATIENT: 50 minutes.   Patient is too unstable for discharge planning.  Patient high risk for cardiac arrest in critically ill at this time,.   Katha Hamming M.D on 10/16/2017 at 2:05 PM  Between 7am to 6pm - Pager - [phone]  After 6pm go to [email or web address] - password [email or web address] [facility]  [facility] [place]  Office  [phone]  CC: Primary care physician; Joanna Hews, MD

## 2017-10-16 NOTE — ED Notes (Signed)
Pt with no allergic reaction from Rocephin

## 2017-10-16 NOTE — Consult Note (Signed)
PULMONARY / CRITICAL CARE MEDICINE   Name: Derek Barnes MRN: [id number] DOB: [birth year]    ADMISSION DATE:  10/15/2017 CONSULTATION DATE:  7/24  PT PROFILE: .67 y.o. M with severe cardiomyopathy (LVEF 20-25%) followed @ [facility]. Also hx of AFRVR, VT, S/P AICD, DM 2, Htn, CVA, CKD admitted with fever, maculopapular rash, AICD firing.  Initially admitted to telemetry floor.  Upon arrival was in wide-complex tachycardia with severe respiratory distress.  Therefore transferred to ICU/SDU for further assessment and management  MAJOR EVENTS/TEST RESULTS:   INDWELLING DEVICES::   MICRO DATA: MRSA PCR >>  Urine 7/24 >>  Blood 7/23 >>   ANTIMICROBIALS:  Ceftriaxone (pyuria) 07/24 >>  Doxycycline (rash, possible tick exposure) 07/24 >>    HISTORY OF PRESENT ILLNESS:   Level 5 caveat due to severe respiratory distress and BiPAP  PAST MEDICAL HISTORY :  He  has a past medical history of CHF (congestive heart failure) (HCC), Chronic kidney disease (CKD), Colitis, Decreased cardiac ejection fraction, Dysrhythmia, Hypertension, Liver abscess, Myocardial infarct (HCC), and Nonischemic cardiomyopathy (HCC).  PAST SURGICAL HISTORY: He  has a past surgical history that includes Fracture surgery; Hernia repair; Knee arthroscopy; Shoulder arthroscopy; Colonoscopy; Insert / replace / remove pacemaker; ACD DEFIBRILLATOR; and Colonoscopy with propofol (N/A, 01/23/2017).  Allergies  Allergen Reactions  . Contrast Media [Iodinated Diagnostic Agents] Anaphylaxis  . Penicillins Anaphylaxis    Has patient had a PCN reaction causing immediate rash, facial/tongue/throat swelling, SOB or lightheadedness with hypotension: Yes Has patient had a PCN reaction causing severe rash involving mucus membranes or skin necrosis: Yes Has patient had a PCN reaction that required hospitalization: No Has patient had a PCN reaction occurring within the last 10 years: No If all of the above answers are "NO", then may  proceed with Cephalosporin use.   . Sotalol Other (See Comments)    Excessive fatigue    No current facility-administered medications on file prior to encounter.    Current Outpatient Medications on File Prior to Encounter  Medication Sig  . amiodarone (PACERONE) 200 MG tablet Take 200 mg by mouth 2 (two) times daily.  Marland Kitchen atorvastatin (LIPITOR) 40 MG tablet Take 40 mg by mouth 2 (two) times daily.   . Budesonide ER 9 MG TB24 Take 1 tablet by mouth daily.  . bumetanide (BUMEX) 1 MG tablet Take 1 mg by mouth daily.  . digoxin (LANOXIN) 0.125 MG tablet Take 1 tablet by mouth daily.   Marland Kitchen eplerenone (INSPRA) 50 MG tablet Take 50 mg by mouth daily.  . isosorbide mononitrate (IMDUR) 30 MG 24 hr tablet Take 30 mg by mouth daily.  . mesalamine (LIALDA) 1.2 g EC tablet Take 2 tablets (2.4 g total) by mouth daily with breakfast. (Patient taking differently: Take 4.8 g by mouth 2 (two) times daily. )  . metFORMIN (GLUCOPHAGE) 500 MG tablet Take 500 mg by mouth 2 (two) times daily with a meal.   . metoprolol succinate (TOPROL-XL) 50 MG 24 hr tablet Take 50 mg by mouth daily. Take with or immediately following a meal.  . nitroGLYCERIN (NITROSTAT) 0.4 MG SL tablet Place 1 tablet under the tongue every 5 (five) minutes as needed.  . sacubitril-valsartan (ENTRESTO) 24-26 MG Take 1 tablet by mouth 2 (two) times daily.  . tamsulosin (FLOMAX) 0.4 MG CAPS capsule Take 1 capsule (0.4 mg total) by mouth daily.  . insulin aspart (NOVOLOG) 100 UNIT/ML injection Inject 0-15 Units into the skin 3 (three) times daily before meals.  FAMILY HISTORY:  His family history includes Cardiomyopathy in his mother. There is no history of Prostate cancer, Bladder Cancer, or Kidney cancer.  SOCIAL HISTORY: He  reports that he has never smoked. He has never used smokeless tobacco. He reports that he does not drink alcohol or use drugs.  REVIEW OF SYSTEMS:   Level 5 caveat  SUBJECTIVE:    VITAL SIGNS: BP (!) 88/57    Pulse (!) 105   Temp 98.7 F (37.1 C) (Axillary)   Resp (!) 29   Ht 5\' 8"  (1.727 m)   Wt 190 lb 4.8 oz (86.3 kg)   SpO2 98%   BMI 28.94 kg/m   HEMODYNAMICS:    VENTILATOR SETTINGS:    INTAKE / OUTPUT: No intake/output data recorded.  PHYSICAL EXAMINATION: General: Severe respiratory distress >improved after furosemide/BiPAP/morphine Neuro: Extremely anxious, no focal deficits HEENT: NCAT, +sclericterus Cardiovascular: Tachy, regular, no M noted Lungs: Labored, scattered wheezes, bibasilar crackles Abdomen: NABS, NT, soft Extremities: Warm, no edema Skin: Maculopapular rash on lower extremities  LABS:  BMET Recent Labs  Lab 10/15/17 2010  NA 135  K 3.6  CL 103  CO2 24  BUN 20  CREATININE 1.32*  GLUCOSE 198*    Electrolytes Recent Labs  Lab 10/15/17 2010  CALCIUM 8.4*    CBC Recent Labs  Lab 10/15/17 2010 10/16/17 0754  WBC 8.0 4.3  HGB 11.2* 10.8*  HCT 33.2* 32.0*  PLT 113* 92*    Coag's Recent Labs  Lab 10/15/17 2010  APTT 32  INR 1.25    Sepsis Markers Recent Labs  Lab 10/15/17 2018  LATICACIDVEN 1.2    ABG No results for input(s): PHART, PCO2ART, PO2ART in the last 168 hours.  Liver Enzymes Recent Labs  Lab 10/15/17 2010  AST 50*  ALT 19  ALKPHOS 51  BILITOT 2.9*  ALBUMIN 3.1*    Cardiac Enzymes Recent Labs  Lab 10/15/17 2010 10/16/17 0754 10/16/17 1000  TROPONINI 0.14* 0.12* 0.18*    Glucose Recent Labs  Lab 10/16/17 0808 10/16/17 1031  GLUCAP 110* 106*    CXR: Initial film revealed only cardiomegaly with clear lung fields.  Follow-up film revealed severe cardiomegaly, R >L interstitial edema   ASSESSMENT / PLAN:  PULMONARY A: Acute hypoxemic respiratory failure Acute pulmonary edema P:   Supplemental oxygen to maintain SPO2 >92% BiPAP as needed Nebulized bronchodilators as needed for wheezing  CARDIOVASCULAR A:  Severe dilated cardiomyopathy Wide-complex tachycardia History of atrial  fibrillation History of ventricular tachycardia P:  Cardiology consultation requested Echocardiogram ordered Amiodarone load and infusion Cycle cardiac markers Continue heparin infusion  RENAL A:   AKI P:   Monitor BMET intermittently Monitor I/Os Correct electrolytes as indicated   GASTROINTESTINAL A:   Elevated bilirubin - likely hepatic congestion  Could also be related to a tickborne illness P:   SUP: N/I presently NPO until respiratory status improves  HEMATOLOGIC A:   Mild anemia without acute blood loss Acute thrombocytopenia P:  DVT px: heparin infusion (atrial fibrillation, elevated troponin I) Monitor CBC intermittently Transfuse per usual guidelines  INFECTIOUS A:   Pyuria Fever with rash, concern for tickborne illness P:   Monitor temp, WBC count Micro and abx as above   ENDOCRINE A:   No acute issues P:   Monitor glucose on chemistry panels Consider SSI for glucose >180  NEUROLOGIC A:   Agitation Acute encephalopathy Respiratory distress P:   RASS goal: 0 Morphine as needed for agitation and respiratory distress  FAMILY: No family available   CCM time: 45 mins The above time includes time spent in consultation with patient and/or family members and reviewing care plan on multidisciplinary rounds  Billy Fischer, MD PCCM service Mobile [phone] Pager [phone]

## 2017-10-16 NOTE — Progress Notes (Signed)
[facility]    Date of Admission:  10/15/2017   Total days of antibiotics 2   ID: Derek Barnes is a 67 y.o. male with a known history of chronic systolic CHF (EF 01-02% as of 72/53/6644 Echo; s/p AICD/PPM), CKD III p/w fever, rash, AICD firing. Pt states that he developed an itchy rash all over his body ~2wks ago. Found to have temp of 100.2F on admit throughout the day, he has had progressive shortness of breath,and persistent hypoxia despite noninvasive ventilation. He was intubated this afternoon. From infection standpoint, he had 2 blood cx drawn on admit, 1 of 4 bottles positive for Staph species (methicillin sensitive) from 7/23. He was initially started on doxycyline plus ceftriaxone with vancomycin added this afternoon. His initial WBC 8K, 84%N, plt of 113 (BL of 160s), AST 50, ALT 19, and tbili 2.9. Doxycycline added due to concern for tickborne illness, while tickborne panel has been sent off. Repeat blood cx have been drawn. Initial CXR did not suggests pulmonary infiltrate.   Objective: Vital signs in last 24 hours: Temp:  [97.7 F (36.5 C)-100.5 F (38.1 C)] 99.2 F (37.3 C) (07/24 1705) Pulse Rate:  [77-114] 81 (07/24 1705) Resp:  [15-33] 24 (07/24 1705) BP: (67-144)/(50-86) 96/68 (07/24 1705) SpO2:  [85 %-99 %] 95 % (07/24 1742) FiO2 (%):  [100 %] 100 % (07/24 1742) Weight:  [190 lb 4.8 oz (86.3 kg)-200 lb (90.7 kg)] 190 lb 4.8 oz (86.3 kg) (07/24 1155)  Lab Results Recent Labs    10/15/17 2010 10/16/17 0754  WBC 8.0 4.3  HGB 11.2* 10.8*  HCT 33.2* 32.0*  NA 135  --   K 3.6  --   CL 103  --   CO2 24  --   BUN 20  --   CREATININE 1.32*  --    Liver Panel Recent Labs    10/15/17 2010  PROT 6.0*  ALBUMIN 3.1*  AST 50*  ALT 19  ALKPHOS 51  BILITOT 2.9*   Microbiology: 7/24 blood cx pending 7/23 blood cx 1 of 4 bottles with methicillin sensitive staph species Studies/Results: Dg Chest 1 View  Result Date:  10/15/2017 CLINICAL DATA:  Possible retrocardiac opacity. EXAM: CHEST  1 VIEW COMPARISON:  Radiograph of same day. Radiographs of March 29, 2017. FINDINGS: Lateral projection only of the chest demonstrates no definite evidence of retrocardiac opacity. Pacemaker is again noted. Visualized thoracic spine is unremarkable. IMPRESSION: No definite retrocardiac opacity seen on lateral projection only. Electronically Signed   By: Lupita Raider, M.D.   On: 10/15/2017 21:53   Dg Abd 1 View  Result Date: 10/16/2017 CLINICAL DATA:  S/p intubation; S/p OG tube placement EXAM: ABDOMEN - 1 VIEW COMPARISON:  None. FINDINGS: OG tube appears adequately positioned in the stomach. Visualized bowel gas pattern is nonobstructive. Cardiomegaly. Bilateral airspace opacities are incompletely imaged, suspected CHF with pulmonary edema. IMPRESSION: OG tube appears adequately positioned in the stomach. Electronically Signed   By: Bary Richard M.D.   On: 10/16/2017 15:34   Portable Chest X-ray  Result Date: 10/16/2017 CLINICAL DATA:  Endotracheal tube placement EXAM: PORTABLE CHEST 1 VIEW COMPARISON:  10/16/2017 FINDINGS: Endotracheal tube in good position. NG tube in the stomach. AICD unchanged in position Right subclavian central venous catheter in the lower SVC. No pneumothorax. Cardiac enlargement. Progression of diffuse bilateral airspace disease right greater than left. Probable pulmonary edema given the vascular congestion and cardiac enlargement. Small pleural effusions IMPRESSION: Endotracheal  tube in good position. Central venous catheter at the cavoatrial junction. Progression of bilateral airspace disease most likely edema. Electronically Signed   By: Marlan Palau M.D.   On: 10/16/2017 15:40   Dg Chest Port 1 View  Result Date: 10/16/2017 CLINICAL DATA:  67 year old male with shortness of breath, fever, rash, AICD firing. EXAM: PORTABLE CHEST 1 VIEW COMPARISON:  10/15/2017 and earlier. FINDINGS: Portable AP semi  upright view at 1139 hours. Stable left chest AICD. Stable severe cardiomegaly. Other mediastinal contours are within normal limits. Visualized tracheal air column is within normal limits. Increased asymmetric indistinct right lung interstitial opacity. No superimposed pneumothorax, pleural effusion, or consolidation. The left lung appears stable and clear. Negative visible bowel gas pattern. No acute osseous abnormality identified. IMPRESSION: 1. Asymmetric increased right lung interstitial opacity could reflect asymmetric interstitial edema or viral/atypical respiratory infection. No pleural effusion identified. 2. Stable severe cardiomegaly. Electronically Signed   By: Odessa Fleming M.D.   On: 10/16/2017 12:00   Dg Chest Portable 1 View  Result Date: 10/15/2017 CLINICAL DATA:  Fever EXAM: PORTABLE CHEST 1 VIEW COMPARISON:  03/29/2017, 03/13/2015 FINDINGS: Left-sided pacing device as before. Globular cardiomegaly without significant interval change. No pleural effusion or focal airspace disease. IMPRESSION: No active disease.  Stable cardiomegaly. Electronically Signed   By: Jasmine Pang M.D.   On: 10/15/2017 21:08   US Abdomen Limited Ruq  Result Date: 10/15/2017 CLINICAL DATA:  Nausea and vomiting EXAM: ULTRASOUND ABDOMEN LIMITED RIGHT UPPER QUADRANT COMPARISON:  CT 08/06/2013 FINDINGS: Gallbladder: Slightly distended gallbladder but without shadowing stone. Small amount of gallbladder sludge. Negative sonographic Murphy. Normal wall thickness. Common bile duct: Diameter: 2.8 mm Liver: No focal lesion identified. Within normal limits in parenchymal echogenicity. Portal vein is patent on color Doppler imaging with normal direction of blood flow towards the liver. IMPRESSION: 1. Slight gallbladder distention with sludge but no other features to suggest acute cholecystitis. No biliary enlargement Electronically Signed   By: Jasmine Pang M.D.   On: 10/15/2017 23:53     Assessment/Plan: 67yo M with ICM EF  20% s/p AICD admitted for fevers, and 2 wk history of rash with his AICD firing and respiratory distress requiring intubation. Etiology of maculapapular rash unclear. He has outdoor exposure, but unclear if clear tick bite exposure. The onset of 2 wk of rash then followed by a fever isn't totally consistent with RMSF or erhlichiosis presentation. For now, would continue doxycycline until other diagnosis can be isolated for his presentation  In terms of staph species bacteremia - thus far 1 of 4 bottles are positive. Given his AICD, makes sense to have abtx but would recommend   - increasing ceftriaxone 2gm IV daily ( and discontinuing vancomycin - unless concern for MRSA) -  If further blood cx +, recommend to get TEE. - would recommend to check for MRSA colonization to see if need to decolonize in the ICU  [facility] Cell: [phone] Pager: [phone]  10/16/2017, 6:23 PM

## 2017-10-16 NOTE — Progress Notes (Signed)
Chaplain responded to a page for support. Friend is upset because he don't know what is going on. Chaplain practiced active and empathic listening. Nurse say the call was premature.Chaplain will continue to follow.    10/16/17 2000  Clinical Encounter Type  Visited With Patient and family together  Visit Type Follow-up  Referral From Family  Spiritual Encounters  Spiritual Needs Emotional

## 2017-10-16 NOTE — Care Management (Signed)
Admitted from home.  ACID fired x 2. Fever and lab abnormalities.  ID and cardiology consults pending

## 2017-10-16 NOTE — Progress Notes (Addendum)
Pt arrived to floor c/o SOB / RN in room / sats low on room air/ 02 applied/ sats improved/ fever also noted/ MD aware and at  bedside/ lasix and tylenol ordered and given/ chest xray also ordered/ pt improved some but again started to c/o sob/ SVN treatment given per MD/ CCMD reporting pt having v.tach on tele/ MD and RN at bedside/ pt never had LOC/ pt stated his defib is not firing/ Rapid Response called/ team at bedside/ amio gtt started / pt moved to CCU 5/ report given to [facility], RN.

## 2017-10-16 NOTE — Progress Notes (Signed)
Pharmacy Antibiotic Note  Derek Barnes is a 67 y.o. male with a h/o AICD admitted on 10/15/2017 with fever of unknown origin .  Pharmacy has been consulted for vancomycin dosing. Patient is also ordered doxycycline for possible tick borne illness as well as ceftriaxone for UTI.   Assessment/Plan: Discussed possibility of blood culture result representing contaminant with Dr. Sung Amabile. However, in the setting of a patient acutely ill presenting with fever of unknown origin and also having AICD/PPM, will treat with vancomycin for now. Will f/u blood culture 7/25.  Vancomycin 1500 mg iv loading dose followed by 1000 mg iv q 12 hours. Will order a trough with the 4th dose for a goal of 15-20 mcg/ml.   Height: 5\' 8"  (172.7 cm) Weight: 190 lb 4.8 oz (86.3 kg) IBW/kg (Calculated) : 68.4  Temp (24hrs), Avg:99.2 F (37.3 C), Min:97.7 F (36.5 C), Max:100.5 F (38.1 C)  Recent Labs  Lab 10/15/17 2010 10/15/17 2018 10/16/17 0754  WBC 8.0  --  4.3  CREATININE 1.32*  --   --   LATICACIDVEN  --  1.2  --     Estimated Creatinine Clearance: 60.5 mL/min (A) (by C-G formula based on SCr of 1.32 mg/dL (H)).    Allergies  Allergen Reactions  . Contrast Media [Iodinated Diagnostic Agents] Anaphylaxis  . Penicillins Anaphylaxis    Has patient had a PCN reaction causing immediate rash, facial/tongue/throat swelling, SOB or lightheadedness with hypotension: Yes Has patient had a PCN reaction causing severe rash involving mucus membranes or skin necrosis: Yes Has patient had a PCN reaction that required hospitalization: No Has patient had a PCN reaction occurring within the last 10 years: No If all of the above answers are "NO", then may proceed with Cephalosporin use.   . Sotalol Other (See Comments)    Excessive fatigue    Antimicrobials this admission: vancomycin 7/24 >>  ceftriaxone 7/24 >>  Doxycycline 7/24 >>  Dose adjustments this admission:   Microbiology results: 7/24 BCx:  7/23  BCx: GPC 1/4 bottles Staph species mec A + 7/24 UCx:   Sputum:     Thank you for allowing pharmacy to be a part of this patient's care.  Valentina Gu 10/16/2017 3:49 PM

## 2017-10-16 NOTE — Progress Notes (Signed)
ANTICOAGULATION CONSULT NOTE - Initial Consult  Pharmacy Consult for heparin drip Indication: chest pain/ACS  Allergies  Allergen Reactions  . Contrast Media [Iodinated Diagnostic Agents] Anaphylaxis  . Penicillins Anaphylaxis    Has patient had a PCN reaction causing immediate rash, facial/tongue/throat swelling, SOB or lightheadedness with hypotension: Yes Has patient had a PCN reaction causing severe rash involving mucus membranes or skin necrosis: Yes Has patient had a PCN reaction that required hospitalization: No Has patient had a PCN reaction occurring within the last 10 years: No If all of the above answers are "NO", then may proceed with Cephalosporin use.   . Sotalol Other (See Comments)    Excessive fatigue    Patient Measurements: Height: 5\' 8"  (172.7 cm) Weight: 200 lb (90.7 kg) IBW/kg (Calculated) : 68.4 Heparin Dosing Weight: 87kg  Vital Signs: Temp: 100.5 F (38.1 C) (07/23 1958) Temp Source: Oral (07/23 1958) BP: 104/63 (07/23 2112) Pulse Rate: 93 (07/23 2112)  Labs: Recent Labs    10/15/17 2010  HGB 11.2*  HCT 33.2*  PLT 113*  APTT 32  LABPROT 15.6*  INR 1.25  CREATININE 1.32*  TROPONINI 0.14*    Estimated Creatinine Clearance: 61.8 mL/min (A) (by C-G formula based on SCr of 1.32 mg/dL (H)).   Medical History: Past Medical History:  Diagnosis Date  . CHF (congestive heart failure) (HCC)   . Chronic kidney disease (CKD)   . Colitis   . Decreased cardiac ejection fraction    EF 5-10%  . Dysrhythmia   . Hypertension   . Liver abscess   . Myocardial infarct (HCC)   . Nonischemic cardiomyopathy (HCC)     Medications:  No anticoag in PTA meds  Assessment: Trop 0.14  Goal of Therapy:  Heparin level 0.3-0.7 units/ml Monitor platelets by anticoagulation protocol: Yes   Plan:  4000 unit bolus and initial rate of 1000 units/hr. First heparin level 6 hours after start of infusion.  Emmily Pellegrin S 10/16/2017,12:08 AM

## 2017-10-16 NOTE — Progress Notes (Signed)
ANTICOAGULATION CONSULT NOTE - Initial Consult  Pharmacy Consult for heparin drip Indication: chest pain/ACS  Allergies  Allergen Reactions  . Contrast Media [Iodinated Diagnostic Agents] Anaphylaxis  . Penicillins Anaphylaxis    Has patient had a PCN reaction causing immediate rash, facial/tongue/throat swelling, SOB or lightheadedness with hypotension: Yes Has patient had a PCN reaction causing severe rash involving mucus membranes or skin necrosis: Yes Has patient had a PCN reaction that required hospitalization: No Has patient had a PCN reaction occurring within the last 10 years: No If all of the above answers are "NO", then may proceed with Cephalosporin use.   . Sotalol Other (See Comments)    Excessive fatigue    Patient Measurements: Height: 5\' 8"  (172.7 cm) Weight: 200 lb (90.7 kg) IBW/kg (Calculated) : 68.4 Heparin Dosing Weight: 87kg  Vital Signs: Temp: 99 F (37.2 C) (07/24 0430) Temp Source: Oral (07/24 0430) BP: 98/70 (07/24 0606) Pulse Rate: 77 (07/24 0430)  Labs: Recent Labs    10/15/17 2010 10/16/17 0754  HGB 11.2* 10.8*  HCT 33.2* 32.0*  PLT 113* 92*  APTT 32  --   LABPROT 15.6*  --   INR 1.25  --   HEPARINUNFRC  --  0.24*  CREATININE 1.32*  --   TROPONINI 0.14*  --     Estimated Creatinine Clearance: 61.8 mL/min (A) (by C-G formula based on SCr of 1.32 mg/dL (H)).   Medical History: Past Medical History:  Diagnosis Date  . CHF (congestive heart failure) (HCC)   . Chronic kidney disease (CKD)   . Colitis   . Decreased cardiac ejection fraction    EF 5-10%  . Dysrhythmia   . Hypertension   . Liver abscess   . Myocardial infarct (HCC)   . Nonischemic cardiomyopathy (HCC)     Medications:  No anticoag in PTA meds  Assessment: Trop 0.14  Goal of Therapy:  Heparin level 0.3-0.7 units/ml Monitor platelets by anticoagulation protocol: Yes   Plan:  4000 unit bolus and initial rate of 1000 units/hr. First heparin level 6 hours  after start of infusion.  10/16/17 07:54 HL subtherapeutic x 1. 1300 units IV x 1 bolus and increase rate to 1150 units/hr. Will recheck HL in 6 hours.  Carola Frost, Pharm.D., BCPS Clinical Pharmacist 10/16/2017,8:35 AM

## 2017-10-16 NOTE — ED Notes (Signed)
This RN to bedside, heparin administered per order. Verified with Erskine Squibb, RN. This RN apologized profusely for delay. Pt states understanding. Pt refusing breakfast at this time. Will continue to monitor for further patient needs.

## 2017-10-16 NOTE — ED Notes (Signed)
This RN to bedside, introduced self to patient and family. Pt is alert and oriented at this time. Apologized and explained delay to patient. Pt states understanding. Pt ventricular paced on the monitor at this time, continues to have heparin running at a rate of 80mL/hr. Reviewed proper call bell use with patient and family. Pt denies further needs. Will continue ot monitor for further patient needs.

## 2017-10-17 ENCOUNTER — Inpatient Hospital Stay: Payer: BC Managed Care – PPO

## 2017-10-17 DIAGNOSIS — R945 Abnormal results of liver function studies: Secondary | ICD-10-CM

## 2017-10-17 DIAGNOSIS — Z7189 Other specified counseling: Secondary | ICD-10-CM

## 2017-10-17 DIAGNOSIS — N179 Acute kidney failure, unspecified: Secondary | ICD-10-CM

## 2017-10-17 LAB — CBC
HCT: 32.9 % — ABNORMAL LOW (ref 40.0–52.0)
Hemoglobin: 11.2 g/dL — ABNORMAL LOW (ref 13.0–18.0)
MCH: 29.7 pg (ref 26.0–34.0)
MCHC: 34.1 g/dL (ref 32.0–36.0)
MCV: 87.2 fL (ref 80.0–100.0)
Platelets: 101 K/uL — ABNORMAL LOW (ref 150–440)
RBC: 3.77 MIL/uL — ABNORMAL LOW (ref 4.40–5.90)
RDW: 17.3 % — ABNORMAL HIGH (ref 11.5–14.5)
WBC: 7.5 K/uL (ref 3.8–10.6)

## 2017-10-17 LAB — ECHOCARDIOGRAM COMPLETE
HEIGHTINCHES: 68 in
WEIGHTICAEL: 3044.82 [oz_av]

## 2017-10-17 LAB — COMPREHENSIVE METABOLIC PANEL
ALK PHOS: 54 U/L (ref 38–126)
ALT: 603 U/L — AB (ref 0–44)
AST: 2093 U/L — ABNORMAL HIGH (ref 15–41)
Albumin: 3.1 g/dL — ABNORMAL LOW (ref 3.5–5.0)
Anion gap: 11 (ref 5–15)
BUN: 36 mg/dL — ABNORMAL HIGH (ref 8–23)
CALCIUM: 7.7 mg/dL — AB (ref 8.9–10.3)
CO2: 20 mmol/L — AB (ref 22–32)
CREATININE: 2.2 mg/dL — AB (ref 0.61–1.24)
Chloride: 109 mmol/L (ref 98–111)
GFR calc non Af Amer: 30 mL/min — ABNORMAL LOW (ref >60)
GFR, EST AFRICAN AMERICAN: 35 mL/min — AB (ref >60)
GLUCOSE: 122 mg/dL — AB (ref 70–99)
Potassium: 4.7 mmol/L (ref 3.5–5.1)
SODIUM: 140 mmol/L (ref 135–145)
Total Bilirubin: 2.9 mg/dL — ABNORMAL HIGH (ref 0.3–1.2)
Total Protein: 5.8 g/dL — ABNORMAL LOW (ref 6.5–8.1)

## 2017-10-17 LAB — GLUCOSE, CAPILLARY
GLUCOSE-CAPILLARY: 122 mg/dL — AB (ref 70–99)
GLUCOSE-CAPILLARY: 98 mg/dL (ref 70–99)
Glucose-Capillary: 114 mg/dL — ABNORMAL HIGH (ref 70–99)

## 2017-10-17 LAB — RICKETTSIAL FEVER ABS
Q FEVER PHASE I: NEGATIVE
Q Fever Phase II: NEGATIVE
RMSF IGG: NEGATIVE

## 2017-10-17 LAB — HEPARIN LEVEL (UNFRACTIONATED)
Heparin Unfractionated: 0.31 [IU]/mL (ref 0.30–0.70)
Heparin Unfractionated: 0.34 IU/mL (ref 0.30–0.70)

## 2017-10-17 LAB — B. BURGDORFI ANTIBODIES

## 2017-10-17 LAB — RAPID HIV SCREEN (HIV 1/2 AB+AG)
HIV 1/2 ANTIBODIES: NONREACTIVE
HIV-1 P24 Antigen - HIV24: NONREACTIVE

## 2017-10-17 LAB — TROPONIN I: Troponin I: 1.58 ng/mL (ref <0.03)

## 2017-10-17 LAB — BRAIN NATRIURETIC PEPTIDE: B Natriuretic Peptide: 705 pg/mL — ABNORMAL HIGH (ref 0.0–100.0)

## 2017-10-17 LAB — CALCIUM, IONIZED: Calcium, Ionized, Serum: 4.4 mg/dL — ABNORMAL LOW (ref 4.5–5.6)

## 2017-10-17 MED ORDER — DIGOXIN 125 MCG PO TABS
125.0000 ug | ORAL_TABLET | ORAL | Status: DC
  Filled 2017-10-17: qty 1

## 2017-10-17 MED ORDER — FAMOTIDINE IN NACL 20-0.9 MG/50ML-% IV SOLN: 20.0000 mg | INTRAVENOUS | Status: DC

## 2017-10-17 MED ORDER — ASPIRIN 81 MG PO CHEW
81.0000 mg | CHEWABLE_TABLET | Freq: Every day | ORAL | Status: DC
  Administered 2017-10-18 – 2017-10-24 (×7): 81 mg via ORAL
  Filled 2017-10-17 (×7): qty 1

## 2017-10-17 MED ORDER — VITAL HIGH PROTEIN PO LIQD: 1000.0000 mL | ORAL | Status: DC

## 2017-10-17 MED ORDER — DIGOXIN 125 MCG PO TABS
125.0000 ug | ORAL_TABLET | ORAL | Status: DC
  Administered 2017-10-19 – 2017-10-23 (×3): 125 ug via ORAL
  Filled 2017-10-17 (×4): qty 1

## 2017-10-17 MED ORDER — METOPROLOL TARTRATE 25 MG PO TABS
25.0000 mg | ORAL_TABLET | Freq: Two times a day (BID) | ORAL | Status: DC
  Administered 2017-10-18 – 2017-10-22 (×8): 25 mg via ORAL
  Filled 2017-10-17 (×8): qty 1

## 2017-10-17 MED ORDER — PRO-STAT SUGAR FREE PO LIQD: 30.0000 mL | Freq: Two times a day (BID) | ORAL | Status: DC

## 2017-10-17 MED ORDER — TRAZODONE HCL 50 MG PO TABS
50.0000 mg | ORAL_TABLET | Freq: Every evening | ORAL | Status: DC | PRN
  Administered 2017-10-17 – 2017-10-21 (×3): 50 mg via ORAL
  Filled 2017-10-17 (×3): qty 1

## 2017-10-17 MED ORDER — AMIODARONE HCL 200 MG PO TABS: 200.0000 mg | ORAL_TABLET | Freq: Every day | ORAL | Status: DC

## 2017-10-17 MED ORDER — CEFTRIAXONE SODIUM 2 G IJ SOLR
2.0000 g | INTRAMUSCULAR | Status: DC
  Administered 2017-10-18: 2 g via INTRAVENOUS
  Filled 2017-10-17: qty 2

## 2017-10-17 MED ORDER — HEPARIN SODIUM (PORCINE) 5000 UNIT/ML IJ SOLN
5000.0000 [IU] | Freq: Three times a day (TID) | INTRAMUSCULAR | Status: DC
  Administered 2017-10-18 – 2017-10-19 (×5): 5000 [IU] via SUBCUTANEOUS
  Filled 2017-10-17 (×5): qty 1

## 2017-10-17 MED ORDER — DEXTROSE 5 % IV SOLN
INTRAVENOUS | Status: DC
  Administered 2017-10-17: 13:00:00 via INTRAVENOUS

## 2017-10-17 MED ORDER — AMIODARONE HCL 200 MG PO TABS
200.0000 mg | ORAL_TABLET | Freq: Every day | ORAL | Status: DC
  Administered 2017-10-18 – 2017-10-24 (×6): 200 mg via ORAL
  Filled 2017-10-17 (×6): qty 1

## 2017-10-17 NOTE — Progress Notes (Signed)
Palliative care:  No charge note.   Consult received and chart reviewed. Patient currently has friends visiting. Self-extubated this AM. Spoke with RN - reports speaking to patient tomorrow AM may be more appropriate. Will plan to visit patient in AM. Patient would like his girlfriend's daughter to be HCPOA. Chaplain is aware and will complete paperwork tomorrow.   Thank you for this consult.  Gerlean Ren, DNP, AGNP-C Palliative Medicine Team Team Phone # [phone]  Pager # [phone]

## 2017-10-17 NOTE — Progress Notes (Signed)
Patient's friend in patient's room making several odd comments. "Is he dead yet?", "We need to just pull the plug and be done with this shit.", "When is he going to die?". Friend also upset that he isn't allowed to sleep in patient's room during the night due to patient's critical condition at this time. House Supervisor called to speak with him.

## 2017-10-17 NOTE — Progress Notes (Signed)
Patient self extubated at 1037.  Alert but drowsy talking to RN and Dr. Sung Amabile.   No respiratory distress with o2 sats 99% on 3 L nasal cannula.  Alert to self and place.  Alert to year.  Denies pain.  Asking for water and RN explained to patient that he needed to wait to be able to take fluids safely since he was just extubated.

## 2017-10-17 NOTE — Progress Notes (Signed)
PULMONARY / CRITICAL CARE MEDICINE   Name: Derek Barnes MRN: [id number] DOB: Apr 24, 1950    ADMISSION DATE:  10/15/2017 CONSULTATION DATE:  7/24  PT PROFILE: 67 y.o. M with severe cardiomyopathy (LVEF 20-25%) followed @ [facility]. Also hx of AFRVR, VT, S/P AICD, DM 2, Htn, CVA, CKD admitted with fever, maculopapular rash, AICD firing.  Initially admitted to telemetry floor.  Upon arrival was in wide-complex tachycardia with severe respiratory distress.  Therefore transferred to ICU/SDU for further assessment and management  MAJOR EVENTS/TEST RESULTS: 07/24 Transferred to ICU with severe respiratory distress, pulmonary edema, WCT. Failed BIPAP, intubated 07/24 Echocardiogram: LVEF 10-15% 07/25 Gas exchange and pulmonary edema pattern on CXR much improved. Worsening renal function and increased LFTs. Vent changes made 07/25 Self extubated and tolerating surprisingly well  INDWELLING DEVICES:: ETT 07/24 >> 07/25 R [place] CVL 07/24  MICRO DATA: MRSA PCR 07/24 >> NEG Urine 7/24 >>  Blood 7/23 >> 1/2 Coag neg staph Blood 07/24 >>  Resp 07/24 >>>   ANTIMICROBIALS:  Doxycycline (rash, possible tick exposure) 07/24 >> 07/25 Vanc 07/24 >> 07/25 Ceftriaxone 07/24 >>     SUBJECTIVE:  Self extubated. Tolerating surprisingly well. Mildly confused. No overt respiratory distress. Remains on phenylephrine. Paced rhythm  VITAL SIGNS: BP 127/70   Pulse 80   Temp 98.9 F (37.2 C) (Axillary)   Resp (!) 24   Ht 5\' 8"  (1.727 m)   Wt 190 lb 4.8 oz (86.3 kg)   SpO2 100%   BMI 28.94 kg/m   HEMODYNAMICS:    VENTILATOR SETTINGS: Vent Mode: PRVC FiO2 (%):  [32 %-100 %] 32 % Set Rate:  [20 bmp] 20 bmp Vt Set:  [600 mL] 600 mL PEEP:  [10 cmH20] 10 cmH20  INTAKE / OUTPUT: I/O last 3 completed shifts: In: 2860.4 [I.V.:1730.4; IV Piggyback:1130] Out: 775 [Urine:775]  PHYSICAL EXAMINATION: General: NAD after self extubation Neuro: Mildly confused, CNs intact, MAEs HEENT: NCAT,  +sclericterus Cardiovascular: Regular, no M noted (paced) Lungs: No wheezes, bibasilar crackles Abdomen: NABS, NT, soft Extremities: Warm, no edema Skin: improving maculopapular rash on lower extremities  LABS:  BMET Recent Labs  Lab 10/15/17 2010 10/16/17 2124 10/17/17 0409  NA 135  --  140  K 3.6 4.7 4.7  CL 103  --  109  CO2 24  --  20*  BUN 20  --  36*  CREATININE 1.32*  --  2.20*  GLUCOSE 198*  --  122*    Electrolytes Recent Labs  Lab 10/15/17 2010 10/17/17 0409  CALCIUM 8.4* 7.7*    CBC Recent Labs  Lab 10/15/17 2010 10/16/17 0754 10/17/17 0409  WBC 8.0 4.3 7.5  HGB 11.2* 10.8* 11.2*  HCT 33.2* 32.0* 32.9*  PLT 113* 92* 101*    Coag's Recent Labs  Lab 10/15/17 2010  APTT 32  INR 1.25    Sepsis Markers Recent Labs  Lab 10/15/17 2018  LATICACIDVEN 1.2    ABG Recent Labs  Lab 10/16/17 1600  PHART 7.29*  PCO2ART 33  PO2ART 63*    Liver Enzymes Recent Labs  Lab 10/15/17 2010 10/17/17 0409  AST 50* 2,093*  ALT 19 603*  ALKPHOS 51 54  BILITOT 2.9* 2.9*  ALBUMIN 3.1* 3.1*    Cardiac Enzymes Recent Labs  Lab 10/16/17 1000 10/16/17 1612 10/17/17 0409  TROPONINI 0.18* 0.33* 1.58*    Glucose Recent Labs  Lab 10/16/17 0808 10/16/17 1031 10/16/17 2329 10/17/17 0724  GLUCAP 110* 106* 112* 114*    CXR: CM,  much improved pulmonary edema   ASSESSMENT / PLAN:  PULMONARY A: Acute hypoxemic respiratory failure Acute pulmonary edema P:   Supplemental oxygen to maintain SPO2 >92% BiPAP as needed Nebulized BDs as needed  CARDIOVASCULAR A:  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%) Wide-complex tachycardia, resolved History of atrial fibrillation History of ventricular tachycardia PSVT Elevated trop I Cardiogenic shock P:  Cardiology following Echocardiogram results reviewed Holding amiodarone infusion due to increased LFT Resume amiodarone 200 mg daily 7/26 Continue heparin infusion Cont  phenylephrine to maintain MAP > 65 mmHg  RENAL A:   AKI Mild metabolic acidosis P:   Monitor BMET intermittently Monitor I/Os Correct electrolytes as indicated  Holding Entresto, spironolactone  GASTROINTESTINAL A:   Elevated LFTs - likely multifactorial P:   SUP: IV famotidine NPO for now  HEMATOLOGIC A:   Mild anemia without acute blood loss Acute thrombocytopenia P:  DVT px: heparin infusion (atrial fibrillation, elevated troponin I) Monitor CBC intermittently Transfuse per usual guidelines  INFECTIOUS A:   Pyuria Rash - doubt tick related illness (rash present X 2 wks) P:   Monitor temp, WBC count Micro and abx as above  DC'd doxycycline due to elevated LFTs  ENDOCRINE A:   No acute issues P:   Monitor glucose on chemistry panels Consider SSI for glucose >180  NEUROLOGIC A:   Mild acute encephalopathy P:   RASS goal: 0 Minimize sedating medications   FAMILY: He has no immediate family members. He indicated to me that he wishes for his recently-deceased girlfriend's daughter (who was at bedside during this discussion) to function as HCPOA.   I began discussion re: goals of care and advanced directives as his cardiomyopathy is extremely severe and he is at high risk for recurrent respiratory failure or cardiac arrest in the near future. He has been followed @ [facility] for cardiomyopathy and there was some discussion re: heart transplant but it is unclear whether he was considered a candidate for this.   CCM time: 60 mins The above time includes time spent in consultation with patient and/or family members and reviewing care plan on multidisciplinary rounds  Billy Fischer, MD PCCM service Mobile [phone] Pager [phone] 10/17/2017 3:55 PM

## 2017-10-17 NOTE — Progress Notes (Signed)
[facility] [place] at [facility]   PATIENT NAME: Derek Barnes    MR#:  [id number]  DATE OF BIRTH:  1950/11/22  SUBJECTIVE: Admitted to telemetry initially but had respiratory distress, wide-complex tachycardia yesterday so transferred to intensive care unit.  Patient respiratory status better, denies chest pain.  On amiodarone drip, phenylephrine for hypotension.  No further fever..  CHIEF COMPLAINT:   Chief Complaint  Patient presents with  . Shortness of Breath  . Nausea   REVIEW OF SYSTEMS:    Review of Systems  Constitutional: Negative for chills and fever.  HENT: Negative for hearing loss.   Eyes: Negative for blurred vision, double vision and photophobia.  Respiratory: Positive for cough and shortness of breath. Negative for hemoptysis.   Cardiovascular: Negative for palpitations, orthopnea and leg swelling.  Gastrointestinal: Negative for abdominal pain, diarrhea and vomiting.  Genitourinary: Negative for dysuria and urgency.  Musculoskeletal: Negative for myalgias and neck pain.  Skin: Negative for rash.  Neurological: Negative for dizziness, focal weakness, seizures, weakness and headaches.  Psychiatric/Behavioral: Negative for memory loss. The patient does not have insomnia.    Nutrition:  Tolerating Diet: Tolerating PT:      DRUG ALLERGIES:   Allergies  Allergen Reactions  . Contrast Media [Iodinated Diagnostic Agents] Anaphylaxis  . Penicillins Anaphylaxis    Has patient had a PCN reaction causing immediate rash, facial/tongue/throat swelling, SOB or lightheadedness with hypotension: Yes Has patient had a PCN reaction causing severe rash involving mucus membranes or skin necrosis: Yes Has patient had a PCN reaction that required hospitalization: No Has patient had a PCN reaction occurring within the last 10 years: No If all of the above answers are "NO", then may proceed with Cephalosporin use.   . Sotalol Other (See Comments)     Excessive fatigue    VITALS:  Blood pressure 124/69, pulse 73, temperature 98.9 F (37.2 C), temperature source Axillary, resp. rate 15, height 5\' 8"  (1.727 m), weight 86.3 kg (190 lb 4.8 oz), SpO2 100 %.  PHYSICAL EXAMINATION:   Physical Exam  GENERAL:  67 y.o.-year-old patient lying in the bed with obviousrespiratory distress. EYES: Pupils equal, round, reactive to light and accommodation. No scleral icterus.  HEENT: Head atraumatic, normocephalic. Oropharynx and nasopharynx clear.  NECK:  Supple, no jugular venous distention. No thyroid enlargement, no tenderness.  LUNGS: Bibasilar crepitations.  Marland Kitchen  CARDIOVASCULAR:  S1, S2  Irregular. .  ABDOMEN: Soft, nontender, nondistended. Bowel sounds present. No organomegaly or mass.  EXTREMITIES:trace pedal edema..  Cyanosis, or clubbing.  NEUROLOGIC, awake, oriented.  Cranial nerves II through XII intact, 5/5 upper and lowerextremities/.  Sensations are intact.  DTRs 2+ bilaterally.   PSYCHIATRIC: The patient is alert and oriented x 3.  During my visit SKIN: No obvious rash, lesion, or ulcer.    LABORATORY PANEL:   CBC Recent Labs  Lab 10/17/17 0409  WBC 7.5  HGB 11.2*  HCT 32.9*  PLT 101*   ------------------------------------------------------------------------------------------------------------------  Chemistries  Recent Labs  Lab 10/17/17 0409  NA 140  K 4.7  CL 109  CO2 20*  GLUCOSE 122*  BUN 36*  CREATININE 2.20*  CALCIUM 7.7*  AST 2,093*  ALT 603*  ALKPHOS 54  BILITOT 2.9*   ------------------------------------------------------------------------------------------------------------------  Cardiac Enzymes Recent Labs  Lab 10/17/17 0409  TROPONINI 1.58*   ------------------------------------------------------------------------------------------------------------------  RADIOLOGY:  Dg Chest 1 View  Result Date: 10/15/2017 CLINICAL DATA:  Possible retrocardiac opacity. EXAM: CHEST  1 VIEW  COMPARISON:  Radiograph  of same day. Radiographs of March 29, 2017. FINDINGS: Lateral projection only of the chest demonstrates no definite evidence of retrocardiac opacity. Pacemaker is again noted. Visualized thoracic spine is unremarkable. IMPRESSION: No definite retrocardiac opacity seen on lateral projection only. Electronically Signed   By: Lupita Raider, M.D.   On: 10/15/2017 21:53   Dg Abd 1 View  Result Date: 10/16/2017 CLINICAL DATA:  S/p intubation; S/p OG tube placement EXAM: ABDOMEN - 1 VIEW COMPARISON:  None. FINDINGS: OG tube appears adequately positioned in the stomach. Visualized bowel gas pattern is nonobstructive. Cardiomegaly. Bilateral airspace opacities are incompletely imaged, suspected CHF with pulmonary edema. IMPRESSION: OG tube appears adequately positioned in the stomach. Electronically Signed   By: Bary Richard M.D.   On: 10/16/2017 15:34   Dg Chest Port 1 View  Result Date: 10/17/2017 CLINICAL DATA:  Respiratory failure. EXAM: PORTABLE CHEST 1 VIEW COMPARISON:  10/16/2017. FINDINGS: Endotracheal tube and NG tube in stable position. Right subclavian line stable position. AICD in stable position. Stable cardiomegaly. Interim near complete clearing of bilateral pulmonary infiltrates/edema. No pleural effusion identified. Right costophrenic angle incompletely imaged. No pneumothorax. IMPRESSION: 1.  Lines and tubes stable position. 2. AICD in stable position. Stable cardiomegaly. Near complete clearing of bilateral pulmonary infiltrates/edema. Electronically Signed   By: Maisie Fus  Register   On: 10/17/2017 05:39   Portable Chest X-ray  Result Date: 10/16/2017 CLINICAL DATA:  Endotracheal tube placement EXAM: PORTABLE CHEST 1 VIEW COMPARISON:  10/16/2017 FINDINGS: Endotracheal tube in good position. NG tube in the stomach. AICD unchanged in position Right subclavian central venous catheter in the lower SVC. No pneumothorax. Cardiac enlargement. Progression of diffuse bilateral  airspace disease right greater than left. Probable pulmonary edema given the vascular congestion and cardiac enlargement. Small pleural effusions IMPRESSION: Endotracheal tube in good position. Central venous catheter at the cavoatrial junction. Progression of bilateral airspace disease most likely edema. Electronically Signed   By: Marlan Palau M.D.   On: 10/16/2017 15:40   Dg Chest Port 1 View  Result Date: 10/16/2017 CLINICAL DATA:  67 year old male with shortness of breath, fever, rash, AICD firing. EXAM: PORTABLE CHEST 1 VIEW COMPARISON:  10/15/2017 and earlier. FINDINGS: Portable AP semi upright view at 1139 hours. Stable left chest AICD. Stable severe cardiomegaly. Other mediastinal contours are within normal limits. Visualized tracheal air column is within normal limits. Increased asymmetric indistinct right lung interstitial opacity. No superimposed pneumothorax, pleural effusion, or consolidation. The left lung appears stable and clear. Negative visible bowel gas pattern. No acute osseous abnormality identified. IMPRESSION: 1. Asymmetric increased right lung interstitial opacity could reflect asymmetric interstitial edema or viral/atypical respiratory infection. No pleural effusion identified. 2. Stable severe cardiomegaly. Electronically Signed   By: Odessa Fleming M.D.   On: 10/16/2017 12:00   Dg Chest Portable 1 View  Result Date: 10/15/2017 CLINICAL DATA:  Fever EXAM: PORTABLE CHEST 1 VIEW COMPARISON:  03/29/2017, 03/13/2015 FINDINGS: Left-sided pacing device as before. Globular cardiomegaly without significant interval change. No pleural effusion or focal airspace disease. IMPRESSION: No active disease.  Stable cardiomegaly. Electronically Signed   By: Jasmine Pang M.D.   On: 10/15/2017 21:08   US Abdomen Limited Ruq  Result Date: 10/17/2017 CLINICAL DATA:  Elevated LFTs EXAM: ULTRASOUND ABDOMEN LIMITED RIGHT UPPER QUADRANT COMPARISON:  10/15/2017 FINDINGS: Gallbladder: Gallbladder sludge is  again identified. No definitive cholelithiasis is seen. No wall thickening or pericholecystic fluid is noted. Common bile duct: Diameter: 3.5 mm. Liver: No focal lesion identified. Within normal limits  in parenchymal echogenicity. Portal vein is patent on color Doppler imaging with normal direction of blood flow towards the liver. IMPRESSION: Gallbladder sludge without acute abnormality. Electronically Signed   By: Alcide Clever M.D.   On: 10/17/2017 12:24   US Abdomen Limited Ruq  Result Date: 10/15/2017 CLINICAL DATA:  Nausea and vomiting EXAM: ULTRASOUND ABDOMEN LIMITED RIGHT UPPER QUADRANT COMPARISON:  CT 08/06/2013 FINDINGS: Gallbladder: Slightly distended gallbladder but without shadowing stone. Small amount of gallbladder sludge. Negative sonographic Murphy. Normal wall thickness. Common bile duct: Diameter: 2.8 mm Liver: No focal lesion identified. Within normal limits in parenchymal echogenicity. Portal vein is patent on color Doppler imaging with normal direction of blood flow towards the liver. IMPRESSION: 1. Slight gallbladder distention with sludge but no other features to suggest acute cholecystitis. No biliary enlargement Electronically Signed   By: Jasmine Pang M.D.   On: 10/15/2017 23:53     ASSESSMENT AND PLAN:   Active Problems:   FUO (fever of unknown origin)  67 year old male patient with history of atrial fibrillation, V. tach, status post AICD placement and follows with [facility] cardiology, patient also has diabetes mellitus type 2, CVA admitted for maculopapular rash and fever and AICD firing.  Patient had respiratory distress with tachycardia yesterday, transferred to ICU.  Patient is on amiodarone drip, spoke with Dr. Juliann Pares from cardiology to see the patient.. 1.  Acute respiratory distress secondary to acute pulmonary edema: Received 40 mg of IV Lasix one-time, transferred to stepdown unit for BiPAP support, patient has history of severe dilated cardiopathy, history of V. tach,  patient received amiodarone loading/infusion, c and by Dr. Juliann Pares from cardiology she needs AICD interrogation.  Continue amiodarone drip.   .  2.  Atrial fibrillation: Patient is on amiodarone drip, heparin drip, follow   #3 acute renal failure patient has CKD stage III; slightly worse today.  Monitor closely..    #4 fever, UTI, patient also had maculopapular rash s by Dr. Ilsa Iha from ID, recommend to continue Rocephin, doxycycline.  Blood cultures 1 out of 4 sets positive for staph species  urineCulture showing more than 100,000 colonies of E. coli.  Blood cultures are no growth till date.  Acute on chronic systolic heart failure, BNP 705, cardiogram this time showed EF 10 to 15%.  status post AICD.  Continue to, heparin drip because of V. tach episodes, Continue Entresto.  Patient on digoxin.   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  high for cardiac arrest.,  Condition critical.  The patient told me that he is ready to die and lost his wife a week ago.  Consult palliative care.  Marland Kitchen  all the records are reviewed and case discussed with Care Management/Social Workerr. Management plans discussed with the patient, family and they are in agreement.  CODE STATUS:full  TOTAL TIME TAKING CARE OF THIS PATIENT: 50 minutes.   Patient is too unstable for discharge planning.  Patient high risk for cardiac arrest in critically ill at this time,. Discussed with patient's  Daughter like person  patient has no family, daughter is in the process of getting healthcare power of attorney papers, she needs help with chaplain.  Katha Hamming M.D on 10/17/2017 at 2:06 PM  Between 7am to 6pm - Pager - [phone]  After 6pm go to [email or web address] - password [email or web address]  [facility]  Office  [phone]  CC: Primary care physician; Joanna Hews, MD

## 2017-10-17 NOTE — Consult Note (Signed)
Reason for Consult: Ventricular tachycardia AICD discharge Referring Physician: Dr. Gilberto Better intensivist Cardiologist [facility]  Derek Barnes is an 67 y.o. male.  HPI: Patient is a 67 year old white male chronic congestive heart failure nonischemic ejection fraction between 20 to 25% patient had AICD permanent pacemaker placed for severe cardiomyopathy she has a history of chronic renal insufficiency presented with fever and rash.  Patient states AICD discharge telemetry suggested tachycardic wide-complex rate around 1 30-1 50 interrogation suggested episodes of sinus tach and subsequent ventricular tachycardia requiring discharge x2.  Patient's had worsening dyspnea reportedly over the last few days and chest tightness so came to the emergency room for evaluation by the time I saw the patient had been intubated and sedated.  Past Medical History:  Diagnosis Date  . CHF (congestive heart failure) ([place])   . Chronic kidney disease (CKD)   . Colitis   . Decreased cardiac ejection fraction    EF 5-10%  . Dysrhythmia   . Hypertension   . Liver abscess   . Myocardial infarct ([place])   . Nonischemic cardiomyopathy [facility])     Past Surgical History:  Procedure Laterality Date  . ACD DEFIBRILLATOR    . COLONOSCOPY    . COLONOSCOPY WITH PROPOFOL N/A 01/23/2017   Procedure: COLONOSCOPY WITH PROPOFOL;  Surgeon: Toledo, Benay Pike, MD;  Location: [facility] ENDOSCOPY;  Service: Gastroenterology;  Laterality: N/A;  . FRACTURE SURGERY    . HERNIA REPAIR    . INSERT / REPLACE / REMOVE PACEMAKER    . KNEE ARTHROSCOPY    . SHOULDER ARTHROSCOPY      Family History  Problem Relation Age of Onset  . Cardiomyopathy Mother   . Prostate cancer Neg Hx   . Bladder Cancer Neg Hx   . Kidney cancer Neg Hx     Social History:  reports that he has never smoked. He has never used smokeless tobacco. He reports that he does not drink alcohol or use drugs.  Allergies:  Allergies  Allergen Reactions  . Contrast  Media [Iodinated Diagnostic Agents] Anaphylaxis  . Penicillins Anaphylaxis    Has patient had a PCN reaction causing immediate rash, facial/tongue/throat swelling, SOB or lightheadedness with hypotension: Yes Has patient had a PCN reaction causing severe rash involving mucus membranes or skin necrosis: Yes Has patient had a PCN reaction that required hospitalization: No Has patient had a PCN reaction occurring within the last 10 years: No If all of the above answers are "NO", then may proceed with Cephalosporin use.   . Sotalol Other (See Comments)    Excessive fatigue    Medications: I have reviewed the patient's current medications.  Results for orders placed or performed during the hospital encounter of 10/15/17 (from the past 48 hour(s))  Comprehensive metabolic panel     Status: Abnormal   Collection Time: 10/15/17  8:10 PM  Result Value Ref Range   Sodium 135 135 - 145 mmol/L   Potassium 3.6 3.5 - 5.1 mmol/L   Chloride 103 98 - 111 mmol/L   CO2 24 22 - 32 mmol/L   Glucose, Bld 198 (H) 70 - 99 mg/dL   BUN 20 8 - 23 mg/dL   Creatinine, Ser 1.32 (H) 0.61 - 1.24 mg/dL   Calcium 8.4 (L) 8.9 - 10.3 mg/dL   Total Protein 6.0 (L) 6.5 - 8.1 g/dL   Albumin 3.1 (L) 3.5 - 5.0 g/dL   AST 50 (H) 15 - 41 U/L   ALT 19 0 - 44 U/L  Alkaline Phosphatase 51 38 - 126 U/L   Total Bilirubin 2.9 (H) 0.3 - 1.2 mg/dL   GFR calc non Af Amer 55 (L) >60 mL/min   GFR calc Af Amer >60 >60 mL/min    Comment: (NOTE) The eGFR has been calculated using the CKD EPI equation. This calculation has not been validated in all clinical situations. eGFR's persistently <60 mL/min signify possible Chronic Kidney Disease.    Anion gap 8 5 - 15    Comment: Performed at [facility], [address] [place], [place] [address]  Troponin I     Status: Abnormal   Collection Time: 10/15/17  8:10 PM  Result Value Ref Range   Troponin I 0.14 (HH) <0.03 ng/mL    Comment: CRITICAL RESULT CALLED TO, READ BACK  BY AND VERIFIED WITH JENNIFER DALLY _0  10/15/17 AKT Performed at [facility], [address] [place], [place] [address]   CBC WITH DIFFERENTIAL     Status: Abnormal   Collection Time: 10/15/17  8:10 PM  Result Value Ref Range   WBC 8.0 3.8 - 10.6 K/uL   RBC 3.83 (L) 4.40 - 5.90 MIL/uL   Hemoglobin 11.2 (L) 13.0 - 18.0 g/dL   HCT 33.2 (L) 40.0 - 52.0 %   MCV 86.6 80.0 - 100.0 fL   MCH 29.3 26.0 - 34.0 pg   MCHC 33.8 32.0 - 36.0 g/dL   RDW 17.2 (H) 11.5 - 14.5 %   Platelets 113 (L) 150 - 440 K/uL   Neutrophils Relative % 84 %   Neutro Abs 6.6 (H) 1.4 - 6.5 K/uL   Lymphocytes Relative 8 %   Lymphs Abs 0.6 (L) 1.0 - 3.6 K/uL   Monocytes Relative 8 %   Monocytes Absolute 0.7 0.2 - 1.0 K/uL   Eosinophils Relative 0 %   Eosinophils Absolute 0.0 0 - 0.7 K/uL   Basophils Relative 0 %   Basophils Absolute 0.0 0 - 0.1 K/uL    Comment: Performed at [facility], [address] [place], [place] [address]  Blood Culture (routine x 2)     Status: Abnormal (Preliminary result)   Collection Time: 10/15/17  8:10 PM  Result Value Ref Range   Specimen Description      BLOOD LEFT ANTECUBITAL Performed at [facility], [address] [place], [place] [address]    Special Requests      BOTTLES DRAWN AEROBIC AND ANAEROBIC Blood Culture adequate volume Performed at [facility], [address] [place], [place] [address]    Culture  Setup Time      AEROBIC BOTTLE ONLY GRAM POSITIVE COCCI CRITICAL RESULT CALLED TO, READ BACK BY AND VERIFIED WITH: [facility] HALLAJI 10/16/17 1437 KLW Performed at [facility], [address] [place], [place] [address]    Culture STAPHYLOCOCCUS SPECIES (COAGULASE NEGATIVE) (A)    Report Status PENDING   Blood Culture (routine x 2)     Status: None (Preliminary result)   Collection Time: 10/15/17  8:10 PM  Result Value Ref Range   Specimen Description BLOOD BLOOD RIGHT WRIST    Special Requests      BOTTLES DRAWN  AEROBIC AND ANAEROBIC Blood Culture adequate volume   Culture      NO GROWTH 2 DAYS Performed at [facility], [address] [place], [place] [address]    Report Status PENDING   Lipase, blood     Status: None   Collection Time: 10/15/17  8:10 PM  Result Value Ref Range   Lipase 26 11 - 51 U/L    Comment: Performed at [facility], [address] [place], [place] [address]  Protime-INR     Status: Abnormal   Collection Time: 10/15/17  8:10 PM  Result Value Ref Range   Prothrombin Time 15.6 (H) 11.4 - 15.2 seconds   INR 1.25     Comment: Performed at [facility], [address] [place], [place] [address]  APTT     Status: None   Collection Time: 10/15/17  8:10 PM  Result Value Ref Range   aPTT 32 24 - 36 seconds    Comment: Performed at [facility], [address] [place], [place] [address]  Blood Culture ID Panel (Reflexed)     Status: Abnormal   Collection Time: 10/15/17  8:10 PM  Result Value Ref Range   Enterococcus species NOT DETECTED NOT DETECTED   Listeria monocytogenes NOT DETECTED NOT DETECTED   Staphylococcus species DETECTED (A) NOT DETECTED    Comment: Methicillin (oxacillin) susceptible coagulase negative staphylococcus. Possible blood culture contaminant (unless isolated from more than one blood culture draw or clinical case suggests pathogenicity). No antibiotic treatment is indicated for blood  culture contaminants. CRITICAL RESULT CALLED TO, READ BACK BY AND VERIFIED WITH: SHEEMA HALLAJI 10/16/17 1437 KLW    Staphylococcus aureus NOT DETECTED NOT DETECTED   Methicillin resistance NOT DETECTED NOT DETECTED   Streptococcus species NOT DETECTED NOT DETECTED   Streptococcus agalactiae NOT DETECTED NOT DETECTED   Streptococcus pneumoniae NOT DETECTED NOT DETECTED   Streptococcus pyogenes NOT DETECTED NOT DETECTED   Acinetobacter baumannii NOT DETECTED NOT DETECTED   Enterobacteriaceae species NOT DETECTED NOT DETECTED    Enterobacter cloacae complex NOT DETECTED NOT DETECTED   Escherichia coli NOT DETECTED NOT DETECTED   Klebsiella oxytoca NOT DETECTED NOT DETECTED   Klebsiella pneumoniae NOT DETECTED NOT DETECTED   Proteus species NOT DETECTED NOT DETECTED   Serratia marcescens NOT DETECTED NOT DETECTED   Haemophilus influenzae NOT DETECTED NOT DETECTED   Neisseria meningitidis NOT DETECTED NOT DETECTED   Pseudomonas aeruginosa NOT DETECTED NOT DETECTED   Candida albicans NOT DETECTED NOT DETECTED   Candida glabrata NOT DETECTED NOT DETECTED   Candida krusei NOT DETECTED NOT DETECTED   Candida parapsilosis NOT DETECTED NOT DETECTED   Candida tropicalis NOT DETECTED NOT DETECTED    Comment: Performed at [facility], [address] [place], [place] [address]  Lactic acid, plasma     Status: None   Collection Time: 10/15/17  8:18 PM  Result Value Ref Range   Lactic Acid, Venous 1.2 0.5 - 1.9 mmol/L    Comment: Performed at [facility], [address], [place], [place] [address]  Urine culture     Status: Abnormal (Preliminary result)   Collection Time: 10/16/17  4:11 AM  Result Value Ref Range   Specimen Description      URINE, RANDOM Performed at [facility], [address], [place], [place] [address]    Special Requests      NONE Performed at [facility], [address], [place], [place] [address]    Culture (A)     >=100,000 COLONIES/mL ESCHERICHIA COLI SUSCEPTIBILITIES TO FOLLOW Performed at [facility] [address] [place], [place] [address]    Report Status PENDING   Urinalysis, Complete w Microscopic     Status: Abnormal   Collection Time: 10/16/17  4:11 AM  Result Value Ref  Range   Color, Urine AMBER (A) YELLOW    Comment: BIOCHEMICALS MAY BE AFFECTED BY COLOR   APPearance HAZY (A) CLEAR   Specific Gravity, Urine 1.026 1.005 - 1.030   pH 5.0 5.0 - 8.0   Glucose, UA NEGATIVE NEGATIVE mg/dL   Hgb urine dipstick MODERATE (A)  NEGATIVE   Bilirubin Urine NEGATIVE NEGATIVE   Ketones, ur NEGATIVE NEGATIVE mg/dL   Protein, ur 100 (A) NEGATIVE mg/dL   Nitrite NEGATIVE NEGATIVE   Leukocytes, UA TRACE (A) NEGATIVE   RBC / HPF 6-10 0 - 5 RBC/hpf   WBC, UA 21-50 0 - 5 WBC/hpf   Bacteria, UA MANY (A) NONE SEEN   Squamous Epithelial / LPF 0-5 0 - 5   Mucus PRESENT    Hyaline Casts, UA PRESENT    Oval Fat Body PRESENT     Comment: Performed at [facility], [address] [place], [place] [address]  Heparin level (unfractionated)     Status: Abnormal   Collection Time: 10/16/17  7:54 AM  Result Value Ref Range   Heparin Unfractionated 0.24 (L) 0.30 - 0.70 IU/mL    Comment: (NOTE) If heparin results are below expected values, and patient dosage has  been confirmed, suggest follow up testing of antithrombin III levels. Performed at [facility], [address] [place], [place] [address]   CBC     Status: Abnormal   Collection Time: 10/16/17  7:54 AM  Result Value Ref Range   WBC 4.3 3.8 - 10.6 K/uL   RBC 3.70 (L) 4.40 - 5.90 MIL/uL   Hemoglobin 10.8 (L) 13.0 - 18.0 g/dL   HCT 32.0 (L) 40.0 - 52.0 %   MCV 86.4 80.0 - 100.0 fL   MCH 29.2 26.0 - 34.0 pg   MCHC 33.9 32.0 - 36.0 g/dL   RDW 17.1 (H) 11.5 - 14.5 %   Platelets 92 (L) 150 - 440 K/uL    Comment: Performed at [facility], [address] [place], [place] [address]  Troponin I     Status: Abnormal   Collection Time: 10/16/17  7:54 AM  Result Value Ref Range   Troponin I 0.12 (HH) <0.03 ng/mL    Comment: CRITICAL VALUE NOTED. VALUE IS CONSISTENT WITH PREVIOUSLY REPORTED/CALLED VALUE JJB Performed at [facility], [address] [place], [place] [address]   Digoxin level     Status: Abnormal   Collection Time: 10/16/17  7:54 AM  Result Value Ref Range   Digoxin Level 0.4 (L) 0.8 - 2.0 ng/mL    Comment: Performed at [facility], [address] [place], [place] [address]  Prealbumin     Status:  Abnormal   Collection Time: 10/16/17  7:54 AM  Result Value Ref Range   Prealbumin 13.1 (L) 18 - 38 mg/dL    Comment: Performed at [facility] [address] [place], [place] [address]  Glucose, capillary     Status: Abnormal   Collection Time: 10/16/17  8:08 AM  Result Value Ref Range   Glucose-Capillary 110 (H) 70 - 99 mg/dL  Troponin I     Status: Abnormal   Collection Time: 10/16/17 10:00 AM  Result Value Ref Range   Troponin I 0.18 (HH) <0.03 ng/mL    Comment: CRITICAL VALUE NOTED. VALUE IS CONSISTENT WITH PREVIOUSLY REPORTED/CALLED VALUE. JML/DAS Performed at [facility], [address] [place], [place] [address]   Glucose, capillary     Status: Abnormal  Collection Time: 10/16/17 10:31 AM  Result Value Ref Range   Glucose-Capillary 106 (H) 70 - 99 mg/dL  Draw ABG 1 hour after initiation of ventilator     Status: Abnormal   Collection Time: 10/16/17  4:00 PM  Result Value Ref Range   FIO2 1.00    Delivery systems VENTILATOR    Mode PRESSURE REGULATED VOLUME CONTROL    VT 600 mL   LHR 24 resp/min   Peep/cpap 10.0 cm H20   pH, Arterial 7.29 (L) 7.350 - 7.450   pCO2 arterial 33 32.0 - 48.0 mmHg   pO2, Arterial 63 (L) 83.0 - 108.0 mmHg   Bicarbonate 15.9 (L) 20.0 - 28.0 mmol/L   Acid-base deficit 9.7 (H) 0.0 - 2.0 mmol/L   O2 Saturation 88.8 %   Patient temperature 37.0    Collection site RIGHT BRACHIAL    Drawn by [id number]    Sample type ARTERIAL DRAW    Allens test (pass/fail) PASS PASS   Mechanical Rate 24     Comment: Performed at [facility], [address] [place], [place] [address]  Heparin level (unfractionated)     Status: None   Collection Time: 10/16/17  4:12 PM  Result Value Ref Range   Heparin Unfractionated 0.30 0.30 - 0.70 IU/mL    Comment: (NOTE) If heparin results are below expected values, and patient dosage has  been confirmed, suggest follow up testing of antithrombin III levels. Performed at [facility],  [address] [place], [place] [address]   Troponin I     Status: Abnormal   Collection Time: 10/16/17  4:12 PM  Result Value Ref Range   Troponin I 0.33 (HH) <0.03 ng/mL    Comment: CRITICAL VALUE NOTED. VALUE IS CONSISTENT WITH PREVIOUSLY REPORTED/CALLED VALUE.  TFK Performed at [facility], [address] [place], [place] [address]   Triglycerides     Status: None   Collection Time: 10/16/17  4:12 PM  Result Value Ref Range   Triglycerides 65 <150 mg/dL    Comment: Performed at [facility], [address] [place], [place] [address]  CULTURE, BLOOD (ROUTINE X 2) w Reflex to ID Panel     Status: None (Preliminary result)   Collection Time: 10/16/17  4:12 PM  Result Value Ref Range   Specimen Description BLOOD BLOOD LEFT HAND    Special Requests      BOTTLES DRAWN AEROBIC AND ANAEROBIC Blood Culture results may not be optimal due to an excessive volume of blood received in culture bottles   Culture      NO GROWTH < 24 HOURS Performed at [facility], [address], [place], [place] [address]    Report Status PENDING   CULTURE, BLOOD (ROUTINE X 2) w Reflex to ID Panel     Status: None (Preliminary result)   Collection Time: 10/16/17  4:43 PM  Result Value Ref Range   Specimen Description BLOOD LEFT HAND    Special Requests      BOTTLES DRAWN AEROBIC AND ANAEROBIC Blood Culture results may not be optimal due to an inadequate volume of blood received in culture bottles   Culture      NO GROWTH < 24 HOURS Performed at [facility], [address] [place], [place] [address]    Report Status PENDING   MRSA PCR Screening     Status: None   Collection Time: 10/16/17  5:01 PM  Result Value Ref Range   MRSA  by PCR NEGATIVE NEGATIVE    Comment:        The GeneXpert MRSA Assay (FDA approved for NASAL specimens only), is one component of a comprehensive MRSA colonization surveillance program. It is not intended to diagnose  MRSA infection nor to guide or monitor treatment for MRSA infections. Performed at [facility], [place], [place] [address]   Heparin level (unfractionated)     Status: Abnormal   Collection Time: 10/16/17  9:24 PM  Result Value Ref Range   Heparin Unfractionated 0.29 (L) 0.30 - 0.70 IU/mL    Comment: (NOTE) If heparin results are below expected values, and patient dosage has  been confirmed, suggest follow up testing of antithrombin III levels. Performed at [facility], [address] [place], [place] [address]   Potassium     Status: None   Collection Time: 10/16/17  9:24 PM  Result Value Ref Range   Potassium 4.7 3.5 - 5.1 mmol/L    Comment: Performed at [facility], [address] [place], [place] [address]  Glucose, capillary     Status: Abnormal   Collection Time: 10/16/17 11:29 PM  Result Value Ref Range   Glucose-Capillary 112 (H) 70 - 99 mg/dL   Comment 1 Notify RN    Comment 2 Document in Chart   CBC     Status: Abnormal   Collection Time: 10/17/17  4:09 AM  Result Value Ref Range   WBC 7.5 3.8 - 10.6 K/uL   RBC 3.77 (L) 4.40 - 5.90 MIL/uL   Hemoglobin 11.2 (L) 13.0 - 18.0 g/dL   HCT 32.9 (L) 40.0 - 52.0 %   MCV 87.2 80.0 - 100.0 fL   MCH 29.7 26.0 - 34.0 pg   MCHC 34.1 32.0 - 36.0 g/dL   RDW 17.3 (H) 11.5 - 14.5 %   Platelets 101 (L) 150 - 440 K/uL    Comment: Performed at [facility], [address] [place], [place] [address]  Comprehensive metabolic panel     Status: Abnormal   Collection Time: 10/17/17  4:09 AM  Result Value Ref Range   Sodium 140 135 - 145 mmol/L   Potassium 4.7 3.5 - 5.1 mmol/L   Chloride 109 98 - 111 mmol/L   CO2 20 (L) 22 - 32 mmol/L   Glucose, Bld 122 (H) 70 - 99 mg/dL   BUN 36 (H) 8 - 23 mg/dL   Creatinine, Ser 2.20 (H) 0.61 - 1.24 mg/dL   Calcium 7.7 (L) 8.9 - 10.3 mg/dL   Total Protein 5.8 (L) 6.5 - 8.1 g/dL   Albumin 3.1 (L) 3.5 - 5.0 g/dL   AST 2,093 (H) 15 - 41  U/L   ALT 603 (H) 0 - 44 U/L   Alkaline Phosphatase 54 38 - 126 U/L   Total Bilirubin 2.9 (H) 0.3 - 1.2 mg/dL   GFR calc non Af Amer 30 (L) >60 mL/min   GFR calc Af Amer 35 (L) >60 mL/min    Comment: (NOTE) The eGFR has been calculated using the CKD EPI equation. This calculation has not been validated in all clinical situations. eGFR's persistently <60 mL/min signify possible Chronic Kidney Disease.    Anion gap 11 5 - 15    Comment: Performed at [facility], [address] [place], [place] [address]  Brain natriuretic peptide     Status: Abnormal   Collection Time: 10/17/17  4:09 AM  Result Value Ref Range   B  Natriuretic Peptide 705.0 (H) 0.0 - 100.0 pg/mL    Comment: Performed at [facility], [address] [place], [place] [address]  Troponin I     Status: Abnormal   Collection Time: 10/17/17  4:09 AM  Result Value Ref Range   Troponin I 1.58 (HH) <0.03 ng/mL    Comment: CRITICAL VALUE NOTED. VALUE IS CONSISTENT WITH PREVIOUSLY REPORTED/CALLED VALUE JJB Performed at [facility], [place], [place] [address]   Heparin level (unfractionated)     Status: None   Collection Time: 10/17/17  4:09 AM  Result Value Ref Range   Heparin Unfractionated 0.31 0.30 - 0.70 IU/mL    Comment: (NOTE) If heparin results are below expected values, and patient dosage has  been confirmed, suggest follow up testing of antithrombin III levels. Performed at [facility], [address] [place], [place] [address]   Glucose, capillary     Status: Abnormal   Collection Time: 10/17/17  7:24 AM  Result Value Ref Range   Glucose-Capillary 114 (H) 70 - 99 mg/dL  Heparin level (unfractionated)     Status: None   Collection Time: 10/17/17  9:58 AM  Result Value Ref Range   Heparin Unfractionated 0.34 0.30 - 0.70 IU/mL    Comment: (NOTE) If heparin results are below expected values, and patient dosage has  been confirmed, suggest follow up  testing of antithrombin III levels. Performed at [facility], [place], [place] [address]   Rapid HIV screen (HIV 1/2 Ab+Ag)     Status: None   Collection Time: 10/17/17 10:54 AM  Result Value Ref Range   HIV-1 P24 Antigen - HIV24 NON REACTIVE NON REACTIVE   HIV 1/2 Antibodies NON REACTIVE NON REACTIVE   Interpretation (HIV Ag Ab)      A non reactive test result means that HIV 1 or HIV 2 antibodies and HIV 1 p24 antigen were not detected in the specimen.    Comment: Performed at [facility], [address] [place], [place] [address]    Dg Chest 1 View  Result Date: 10/15/2017 CLINICAL DATA:  Possible retrocardiac opacity. EXAM: CHEST  1 VIEW COMPARISON:  Radiograph of same day. Radiographs of March 29, 2017. FINDINGS: Lateral projection only of the chest demonstrates no definite evidence of retrocardiac opacity. Pacemaker is again noted. Visualized thoracic spine is unremarkable. IMPRESSION: No definite retrocardiac opacity seen on lateral projection only. Electronically Signed   By: Marijo Conception, M.D.   On: 10/15/2017 21:53   Dg Abd 1 View  Result Date: 10/16/2017 CLINICAL DATA:  S/p intubation; S/p OG tube placement EXAM: ABDOMEN - 1 VIEW COMPARISON:  None. FINDINGS: OG tube appears adequately positioned in the stomach. Visualized bowel gas pattern is nonobstructive. Cardiomegaly. Bilateral airspace opacities are incompletely imaged, suspected CHF with pulmonary edema. IMPRESSION: OG tube appears adequately positioned in the stomach. Electronically Signed   By: Franki Cabot M.D.   On: 10/16/2017 15:34   Dg Chest Port 1 View  Result Date: 10/17/2017 CLINICAL DATA:  Respiratory failure. EXAM: PORTABLE CHEST 1 VIEW COMPARISON:  10/16/2017. FINDINGS: Endotracheal tube and NG tube in stable position. Right subclavian line stable position. AICD in stable position. Stable cardiomegaly. Interim near complete clearing of bilateral pulmonary  infiltrates/edema. No pleural effusion identified. Right costophrenic angle incompletely imaged. No pneumothorax. IMPRESSION: 1.  Lines and tubes stable position. 2. AICD in stable position. Stable cardiomegaly. Near complete clearing of bilateral pulmonary infiltrates/edema. Electronically Signed  By: [place]   On: 10/17/2017 05:39   Portable Chest X-ray  Result Date: 10/16/2017 CLINICAL DATA:  Endotracheal tube placement EXAM: PORTABLE CHEST 1 VIEW COMPARISON:  10/16/2017 FINDINGS: Endotracheal tube in good position. NG tube in the stomach. AICD unchanged in position Right subclavian central venous catheter in the lower SVC. No pneumothorax. Cardiac enlargement. Progression of diffuse bilateral airspace disease right greater than left. Probable pulmonary edema given the vascular congestion and cardiac enlargement. Small pleural effusions IMPRESSION: Endotracheal tube in good position. Central venous catheter at the cavoatrial junction. Progression of bilateral airspace disease most likely edema. Electronically Signed   By: Franchot Gallo M.D.   On: 10/16/2017 15:40   Dg Chest Port 1 View  Result Date: 10/16/2017 CLINICAL DATA:  67 year old male with shortness of breath, fever, rash, AICD firing. EXAM: PORTABLE CHEST 1 VIEW COMPARISON:  10/15/2017 and earlier. FINDINGS: Portable AP semi upright view at 1139 hours. Stable left chest AICD. Stable severe cardiomegaly. Other mediastinal contours are within normal limits. Visualized tracheal air column is within normal limits. Increased asymmetric indistinct right lung interstitial opacity. No superimposed pneumothorax, pleural effusion, or consolidation. The left lung appears stable and clear. Negative visible bowel gas pattern. No acute osseous abnormality identified. IMPRESSION: 1. Asymmetric increased right lung interstitial opacity could reflect asymmetric interstitial edema or viral/atypical respiratory infection. No pleural effusion identified.  2. Stable severe cardiomegaly. Electronically Signed   By: Genevie Ann M.D.   On: 10/16/2017 12:00   Dg Chest Portable 1 View  Result Date: 10/15/2017 CLINICAL DATA:  Fever EXAM: PORTABLE CHEST 1 VIEW COMPARISON:  03/29/2017, 03/13/2015 FINDINGS: Left-sided pacing device as before. Globular cardiomegaly without significant interval change. No pleural effusion or focal airspace disease. IMPRESSION: No active disease.  Stable cardiomegaly. Electronically Signed   By: Donavan Foil M.D.   On: 10/15/2017 21:08   US Abdomen Limited Ruq  Result Date: 10/15/2017 CLINICAL DATA:  Nausea and vomiting EXAM: ULTRASOUND ABDOMEN LIMITED RIGHT UPPER QUADRANT COMPARISON:  CT 08/06/2013 FINDINGS: Gallbladder: Slightly distended gallbladder but without shadowing stone. Small amount of gallbladder sludge. Negative sonographic Murphy. Normal wall thickness. Common bile duct: Diameter: 2.8 mm Liver: No focal lesion identified. Within normal limits in parenchymal echogenicity. Portal vein is patent on color Doppler imaging with normal direction of blood flow towards the liver. IMPRESSION: 1. Slight gallbladder distention with sludge but no other features to suggest acute cholecystitis. No biliary enlargement Electronically Signed   By: Donavan Foil M.D.   On: 10/15/2017 23:53    Review of Systems  Unable to perform ROS: Intubated   Blood pressure (!) 79/63, pulse 66, temperature 98.6 F (37 C), temperature source Axillary, resp. rate (!) 21, height _0  (1.727 m), weight 190 lb 4.8 oz (86.3 kg), SpO2 98 %. Physical Exam  Nursing note and vitals reviewed. Constitutional: He is oriented to person, place, and time. He appears well-developed and well-nourished.  HENT:  Head: Normocephalic and atraumatic.  Eyes: Pupils are equal, round, and reactive to light. Conjunctivae and EOM are normal.  Neck: Normal range of motion. Neck supple.  Cardiovascular: Regular rhythm. Tachycardia present. Exam reveals gallop.  Murmur  heard. Respiratory: Effort normal and breath sounds normal.  GI: Soft. Bowel sounds are normal.  Musculoskeletal: Normal range of motion. He exhibits edema.  Neurological: He is alert and oriented to person, place, and time. He has normal reflexes.  Skin: Skin is warm and dry.  Psychiatric: He has a normal mood and affect.  Assessment/Plan: AICD discharge Sustained ventricular tachycardia Nonischemic cardiomyopathy Chronic renal insufficiency stage III Shortness of breath Congestive heart failure Hypertension Demand ischemia . Plan  intubated sedated continue continue present  therapy AICD pacemaker interrogation Agree with amiodarone to help with ventricular tachycardia Continue aggressive heart failure therapy Continue intensive care therapy DVT prophylaxis lateral knee is the most anterior chamber and as it enlarges it tends to level all the retrosternal region where you might Agree with diabetes management was switched to IV insulin coverage Continue Lipitor therapy for lipid management Maintain Entresto for heart failure therapy conservative cardiac therapy be on amiodarone Refer the patient back to [facility] hospital upon discharge   Max Nuno D Verlan Grotz 10/17/2017, 11:54 AM

## 2017-10-17 NOTE — Progress Notes (Signed)
Contacted Dr. Sung Amabile to verify discontinuance of amiodarone and start PO amiodarone tomorrow morning (10/18/17). Received order for ice chips as well.

## 2017-10-18 ENCOUNTER — Inpatient Hospital Stay: Payer: BC Managed Care – PPO

## 2017-10-18 DIAGNOSIS — I420 Dilated cardiomyopathy: Secondary | ICD-10-CM

## 2017-10-18 DIAGNOSIS — Z515 Encounter for palliative care: Secondary | ICD-10-CM

## 2017-10-18 DIAGNOSIS — R748 Abnormal levels of other serum enzymes: Secondary | ICD-10-CM

## 2017-10-18 DIAGNOSIS — Z7189 Other specified counseling: Secondary | ICD-10-CM

## 2017-10-18 LAB — BASIC METABOLIC PANEL
ANION GAP: 17 — AB (ref 5–15)
BUN: 62 mg/dL — ABNORMAL HIGH (ref 8–23)
CALCIUM: 7.6 mg/dL — AB (ref 8.9–10.3)
CO2: 12 mmol/L — AB (ref 22–32)
Chloride: 104 mmol/L (ref 98–111)
Creatinine, Ser: 3.01 mg/dL — ABNORMAL HIGH (ref 0.61–1.24)
GFR, EST AFRICAN AMERICAN: 24 mL/min — AB (ref >60)
GFR, EST NON AFRICAN AMERICAN: 20 mL/min — AB (ref >60)
GLUCOSE: 164 mg/dL — AB (ref 70–99)
POTASSIUM: 4.2 mmol/L (ref 3.5–5.1)
Sodium: 133 mmol/L — ABNORMAL LOW (ref 135–145)

## 2017-10-18 LAB — COMPREHENSIVE METABOLIC PANEL
ALK PHOS: 52 U/L (ref 38–126)
ALT: 805 U/L — AB (ref 0–44)
AST: 1589 U/L — ABNORMAL HIGH (ref 15–41)
Albumin: 3 g/dL — ABNORMAL LOW (ref 3.5–5.0)
Anion gap: 11 (ref 5–15)
BUN: 52 mg/dL — ABNORMAL HIGH (ref 8–23)
CO2: 17 mmol/L — ABNORMAL LOW (ref 22–32)
CREATININE: 2.55 mg/dL — AB (ref 0.61–1.24)
Calcium: 7.5 mg/dL — ABNORMAL LOW (ref 8.9–10.3)
Chloride: 107 mmol/L (ref 98–111)
GFR, EST AFRICAN AMERICAN: 29 mL/min — AB (ref >60)
GFR, EST NON AFRICAN AMERICAN: 25 mL/min — AB (ref >60)
Glucose, Bld: 144 mg/dL — ABNORMAL HIGH (ref 70–99)
Potassium: 3.6 mmol/L (ref 3.5–5.1)
Sodium: 135 mmol/L (ref 135–145)
Total Bilirubin: 3.2 mg/dL — ABNORMAL HIGH (ref 0.3–1.2)
Total Protein: 5.7 g/dL — ABNORMAL LOW (ref 6.5–8.1)

## 2017-10-18 LAB — CBC
HCT: 29.2 % — ABNORMAL LOW (ref 40.0–52.0)
HEMOGLOBIN: 10 g/dL — AB (ref 13.0–18.0)
MCH: 29.7 pg (ref 26.0–34.0)
MCHC: 34.4 g/dL (ref 32.0–36.0)
MCV: 86.3 fL (ref 80.0–100.0)
RBC: 3.39 MIL/uL — AB (ref 4.40–5.90)
RDW: 17 % — ABNORMAL HIGH (ref 11.5–14.5)
WBC: 4 10*3/uL (ref 3.8–10.6)

## 2017-10-18 LAB — GLUCOSE, CAPILLARY
GLUCOSE-CAPILLARY: 195 mg/dL — AB (ref 70–99)
Glucose-Capillary: 109 mg/dL — ABNORMAL HIGH (ref 70–99)
Glucose-Capillary: 171 mg/dL — ABNORMAL HIGH (ref 70–99)
Glucose-Capillary: 174 mg/dL — ABNORMAL HIGH (ref 70–99)

## 2017-10-18 LAB — BLOOD GAS, ARTERIAL
Acid-Base Excess: UNDETERMINED mmol/L (ref 0.0–2.0)
BICARBONATE: UNDETERMINED mmol/L (ref 20.0–28.0)
FIO2: 0.21
O2 Saturation: UNDETERMINED %
PO2 ART: 85 mmHg (ref 83.0–108.0)
Patient temperature: 37
pCO2 arterial: 19 mmHg — CL (ref 32.0–48.0)
pH, Arterial: 7.39 (ref 7.350–7.450)

## 2017-10-18 LAB — HEPATITIS PANEL, ACUTE
HCV Ab: <0.1 s/co ratio (ref 0.0–0.9)
Hep A IgM: NEGATIVE
Hep B C IgM: NEGATIVE
Hepatitis B Surface Ag: NEGATIVE

## 2017-10-18 LAB — URINE CULTURE: Culture: >=100000 — AB

## 2017-10-18 LAB — CULTURE, BLOOD (ROUTINE X 2): SPECIAL REQUESTS: ADEQUATE

## 2017-10-18 LAB — PROCALCITONIN: PROCALCITONIN: 27.66 ng/mL

## 2017-10-18 LAB — LACTIC ACID, PLASMA
LACTIC ACID, VENOUS: 5.7 mmol/L — AB (ref 0.5–1.9)
Lactic Acid, Venous: 5.9 mmol/L (ref 0.5–1.9)

## 2017-10-18 LAB — ROCKY MTN SPOTTED FVR ABS PNL(IGG+IGM)
RMSF IGM: 0.26 {index} (ref 0.00–0.89)
RMSF IgG: NEGATIVE

## 2017-10-18 MED ORDER — ALPRAZOLAM 0.5 MG PO TABS
0.2500 mg | ORAL_TABLET | Freq: Two times a day (BID) | ORAL | Status: DC | PRN
  Administered 2017-10-18 – 2017-10-21 (×2): 0.25 mg via ORAL
  Filled 2017-10-18 (×2): qty 1

## 2017-10-18 MED ORDER — ALBUTEROL SULFATE (2.5 MG/3ML) 0.083% IN NEBU
2.5000 mg | INHALATION_SOLUTION | RESPIRATORY_TRACT | Status: DC | PRN
  Administered 2017-10-18: 2.5 mg via RESPIRATORY_TRACT
  Filled 2017-10-18: qty 3

## 2017-10-18 MED ORDER — SODIUM CHLORIDE 0.9 % IV SOLN
INTRAVENOUS | Status: DC
  Administered 2017-10-18: 16:00:00 via INTRAVENOUS

## 2017-10-18 MED ORDER — FAMOTIDINE 20 MG PO TABS
20.0000 mg | ORAL_TABLET | Freq: Every day | ORAL | Status: DC
  Administered 2017-10-18 – 2017-10-24 (×7): 20 mg via ORAL
  Filled 2017-10-18 (×9): qty 1

## 2017-10-18 MED ORDER — CEFTRIAXONE SODIUM 1 G IJ SOLR
1.0000 g | INTRAMUSCULAR | Status: DC
  Administered 2017-10-19 – 2017-10-20 (×2): 1 g via INTRAVENOUS
  Filled 2017-10-18 (×2): qty 1
  Filled 2017-10-18: qty 10

## 2017-10-18 MED ORDER — METHYLPREDNISOLONE SODIUM SUCC 125 MG IJ SOLR
60.0000 mg | Freq: Three times a day (TID) | INTRAMUSCULAR | Status: DC
  Administered 2017-10-18 – 2017-10-20 (×6): 60 mg via INTRAVENOUS
  Filled 2017-10-18 (×6): qty 2

## 2017-10-18 NOTE — Consult Note (Signed)
Consultation Note Date: 10/18/2017   Patient Name: Derek Barnes  DOB: [birth year]  MRN: [id number]  Age / Sex: 67 y.o., male  PCP: Phineas Inches, MD Referring Physician: Nicholes Mango, MD  Reason for Consultation: Establishing goals of care  HPI/Patient Profile: 67 y.o. male  with past medical history of severe cardiomyopathy (LVEF 20-25%), afib, VTach s/p AICD, T2DM, HTN, CVA, and CKD admitted on 10/15/2017 with fever, rash and AICD firing. Was transferred to ICU d/t wide-complex tachycardia and severe respiratory distress. Failed bipap and intubated 7/24. Found to have pulmonary edema. Echocardiogram revealed worsening EF at 10-15%. On 7/25 patient self-extubated and tolerated surprisingly well. PMT consulted by hospitalist for [place].   Clinical Assessment and Goals of Care: I have reviewed medical records including EPIC notes, labs and imaging, received report from RN and Dr. [place] Fuel, assessed the patient and then met at the bedside along with patient  to discuss diagnosis prognosis, [place], EOL wishes, disposition and options.  I introduced Palliative Medicine as specialized medical care for people living with serious illness. It focuses on providing relief from the symptoms and stress of a serious illness. The goal is to improve quality of life for both the patient and the family.  We discussed a brief life review of the patient. 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 He had a girlfriend for 22 years who just died of cancer last week, her funeral was Monday. He tells me about her, how they met, and how her loss has affected him.   As far as functional and nutritional status, he is ambulatory and able to complete all ADLs. Activity is limited at times d/t shortness of breath.     We discussed his current illness and  what it means in the larger context of their on-going co-morbidities.  Natural disease trajectory and expectations at EOL were discussed. He has good understanding of disease. He says multiple times "It wont be long for me". When asked for clarification, he tells me he understands how sick he is and may be nearing end of life. Tells me he wants to die at home.   I attempted to elicit values and goals of care important to the patient.  He is focused on getting home and being on his farm. Does not want to be in the hospital.   The difference between aggressive medical intervention and comfort care was considered in light of the patient's goals of care. He would be open to aggressive interventions such as transplant if were offered to him. He plans to speak to his cardiologist soon.   Advanced directives, concepts specific to code status, artifical feeding and hydration, and rehospitalization were considered and discussed. At first he tells me he would not want to go back on ventilator but after we discussed this further, he says he would. Would not want long-term ventilation/trach. Does confirm he would not want CPR. We discussed that he still has his AICD in place. He does share with me that his girlfriend's daughter, Sheran Fava, is like a daughter to him and he would want her to be his HCPOA.  Hospice and Palliative Care services outpatient were explained and offered. The patient is open to hospice care and would be eligible - he would like to discuss options with cardiologist before opting for hospice.   Questions and concerns were addressed.  The family was encouraged to call with questions or concerns.   Primary Decision Maker PATIENT   SUMMARY OF RECOMMENDATIONS   - initial palliative conversation, education completed - patient does not want CPR, would be okay with short-term ventilation, no long-term or trach -plans to discuss his care with cardiologist and what options are available to  him, would be open to hospice if he is not a candidate for any further treatment  Code Status/Advance Care Planning:  Limited code - no CPR   Symptom Management:   Per primary  Additional Recommendations (Limitations, Scope, Preferences):  Full Scope Treatment  Psycho-social/Spiritual:   Desire for further Chaplaincy support:yes  Additional Recommendations: Education on Hospice  Prognosis:   Unable to determine poor prognosis r/t severe cardiomyopathy  Discharge Planning: Home with Palliative Services      Primary Diagnoses: Present on Admission: . FUO (fever of unknown origin)   I have reviewed the medical record, interviewed the patient and family, and examined the patient. The following aspects are pertinent.  Past Medical History:  Diagnosis Date  . CHF (congestive heart failure) ([place])   . Chronic kidney disease (CKD)   . Colitis   . Decreased cardiac ejection fraction    EF 5-10%  . Dysrhythmia   . Hypertension   . Liver abscess   . Myocardial infarct ([place])   . Nonischemic cardiomyopathy ([place])    Social History   Socioeconomic History  . Marital status: Divorced    Spouse name: Not on file  . Number of children: Not on file  . Years of education: Not on file  . Highest education level: Not on file  Occupational History  . Not on file  Social Needs  . Financial resource strain: Not on file  . Food insecurity:    Worry: Not on file    Inability: Not on file  . Transportation needs:    Medical: Not on file    Non-medical: Not on file  Tobacco Use  . Smoking status: Never Smoker  . Smokeless tobacco: Never Used  Substance and Sexual Activity  . Alcohol use: No  . Drug use: No  . Sexual activity: Not Currently  Lifestyle  . Physical activity:    Days per week: Not on file    Minutes per session: Not on file  . Stress: Not on file  Relationships  . Social connections:    Talks on phone: Not on file    Gets together: Not on file     Attends religious service: Not on file    Active member of club or organization: Not on file    Attends meetings of clubs or organizations: Not on file    Relationship status: Not on file  Other Topics Concern  . Not on file  Social History Narrative   Independent and active at baseline.   Family History  Problem Relation Age of Onset  . Cardiomyopathy Mother   . Prostate cancer Neg Hx   . Bladder Cancer Neg Hx   . Kidney cancer Neg Hx    Scheduled Meds: . amiodarone  200 mg Oral Daily  . aspirin  81 mg Oral Daily  . chlorhexidine gluconate (MEDLINE KIT)  15 mL Mouth Rinse BID  . [START ON 10/19/2017] digoxin  125 mcg Oral QODAY  . heparin injection (subcutaneous)  5,000 Units Subcutaneous Q8H  . metoprolol tartrate  25 mg Oral BID  . nitroGLYCERIN  0.3 mg Transdermal Daily   Continuous Infusions: . cefTRIAXone (  ROCEPHIN)  IV Stopped (10/18/17 0630)  . dextrose 30 mL/hr at 10/17/17 2000  . famotidine (PEPCID) IV     PRN Meds:.bisacodyl, docusate, ipratropium-albuterol, metoprolol tartrate, nitroGLYCERIN, [DISCONTINUED] ondansetron **OR** ondansetron (ZOFRAN) IV, senna-docusate, traZODone Allergies  Allergen Reactions  . Contrast Media [Iodinated Diagnostic Agents] Anaphylaxis  . Penicillins Anaphylaxis    Has patient had a PCN reaction causing immediate rash, facial/tongue/throat swelling, SOB or lightheadedness with hypotension: Yes Has patient had a PCN reaction causing severe rash involving mucus membranes or skin necrosis: Yes Has patient had a PCN reaction that required hospitalization: No Has patient had a PCN reaction occurring within the last 10 years: No If all of the above answers are "NO", then may proceed with Cephalosporin use.   . Sotalol Other (See Comments)    Excessive fatigue   Review of Systems  Constitutional: Positive for activity change, appetite change and fatigue.  Respiratory: Negative for chest tightness and shortness of breath.     Cardiovascular: Negative for chest pain and leg swelling.  Neurological: Positive for weakness.    Physical Exam  Constitutional: He is oriented to person, place, and time. He appears well-developed and well-nourished. No distress.  HENT:  Head: Normocephalic and atraumatic.  Cardiovascular: Regular rhythm. Tachycardia present.  Pulmonary/Chest: No accessory muscle usage. No respiratory distress.  Neurological: He is alert and oriented to person, place, and time.  Skin: Skin is warm and dry. He is not diaphoretic.  Psychiatric: He has a normal mood and affect. His speech is normal and behavior is normal. Cognition and memory are normal.    Vital Signs: BP (!) 110/93   Pulse (!) 109   Temp (!) 96.5 F (35.8 C) (Axillary)   Resp (!) 21   Ht '5\' 8"'$  (1.727 m)   Wt 190 lb 4.8 oz (86.3 kg)   SpO2 96%   BMI 28.94 kg/m  Pain Scale: 0-10   Pain Score: 0-No pain   SpO2: SpO2: 96 % O2 Device:SpO2: 96 % O2 Flow Rate: .O2 Flow Rate (L/min): 2 L/min  IO: Intake/output summary:   Intake/Output Summary (Last 24 hours) at 10/18/2017 0921 Last data filed at 10/18/2017 0800 Gross per 24 hour  Intake 1301.92 ml  Output 525 ml  Net 776.92 ml    LBM: Last BM Date: 10/15/17 Baseline Weight: Weight: 200 lb (90.7 kg) Most recent weight: Weight: 190 lb 4.8 oz (86.3 kg)     Palliative Assessment/Data: PPS 80%      Time Total: 80 minutes Greater than 50%  of this time was spent counseling and coordinating care related to the above assessment and plan.  Juel Burrow, DNP, AGNP-C Palliative Medicine Team [phone] Pager: [phone]

## 2017-10-18 NOTE — Progress Notes (Signed)
Patient complaining of sob,"lightheaded like I am going to pass out" after receiving IV steroids, O2 sat 98% on 2L N/C. VSS, Dr. Amado Coe notified and received verbal orders for STAT ABG. Will continue to monitor.

## 2017-10-18 NOTE — Progress Notes (Signed)
Pt keeps taking nasal canula off, has become very confused, patient has had no out put since being transferred from [facility],(12:03)  Patient has foley in presently. Dr. Amado Coe notified and received verbal orders for 200 cc bolus. Will administer and continue to monitor closely.

## 2017-10-18 NOTE — Progress Notes (Signed)
PULMONARY / CRITICAL CARE MEDICINE   Name: ALECSANDER BRACKENS MRN: [id number] DOB: 1951/01/17    ADMISSION DATE:  10/15/2017 CONSULTATION DATE:  7/24  PT PROFILE: .67 y.o. M with severe cardiomyopathy (LVEF 20-25%) followed @ [facility]. Also hx of AFRVR, VT, S/P AICD, DM 2, Htn, CVA, CKD admitted with fever, maculopapular rash, AICD firing.  Initially admitted to telemetry floor.  Upon arrival was in wide-complex tachycardia with severe respiratory distress.  Therefore transferred to ICU/SDU for further assessment and management  MAJOR EVENTS/TEST RESULTS: 07/24 Transferred to ICU with severe respiratory distress, pulmonary edema, WCT. Failed BIPAP, intubated 07/24 Echocardiogram: LVEF 10-15% 07/25 Gas exchange and pulmonary edema pattern on CXR much improved. Worsening renal function and increased LFTs. Vent changes made 07/25 Self extubated and tolerating surprisingly well  INDWELLING DEVICES:: ETT 07/24 >> 07/25 R [place] CVL 07/24  MICRO DATA: MRSA PCR 07/24 >> NEG Urine 7/24 >>  Blood 7/23 >> 1/2 Coag neg staph Blood 07/24 >>  Resp 07/24 >>>   ANTIMICROBIALS:  Doxycycline (rash, possible tick exposure) 07/24 >> 07/25 Vanc 07/24 >> 07/25 Ceftriaxone 07/24 >>     SUBJECTIVE:  Self extubated. Had no difficulty overnight, presently on room air with stable oxygen saturation at 97% and stable hemodynamics.  VITAL SIGNS: BP 97/68   Pulse (!) 105   Temp (!) 96.5 F (35.8 C) (Axillary)   Resp (!) 25   Ht 5\' 8"  (1.727 m)   Wt 190 lb 4.8 oz (86.3 kg)   SpO2 98%   BMI 28.94 kg/m   VENTILATOR SETTINGS: Vent Mode: PRVC FiO2 (%):  [32 %-80 %] 32 % Set Rate:  [20 bmp] 20 bmp Vt Set:  [600 mL] 600 mL PEEP:  [10 cmH20] 10 cmH20  INTAKE / OUTPUT: I/O last 3 completed shifts: In: 3103.5 [I.V.:1873.5; Other:350; IV Piggyback:880] Out: 775 [Urine:775]  PHYSICAL EXAMINATION: General: NAD after self extubation Neuro: CNs intact, MAEs HEENT: NCAT, +sclericterus Cardiovascular:  Regular, no M noted (paced) Lungs: No wheezes, bibasilar crackles Abdomen: NABS, NT, soft Extremities: Warm, no edema Skin: improving maculopapular rash on lower extremities  LABS:  BMET Recent Labs  Lab 10/15/17 2010 10/16/17 2124 10/17/17 0409  NA 135  --  140  K 3.6 4.7 4.7  CL 103  --  109  CO2 24  --  20*  BUN 20  --  36*  CREATININE 1.32*  --  2.20*  GLUCOSE 198*  --  122*    Electrolytes Recent Labs  Lab 10/15/17 2010 10/17/17 0409  CALCIUM 8.4* 7.7*    CBC Recent Labs  Lab 10/15/17 2010 10/16/17 0754 10/17/17 0409  WBC 8.0 4.3 7.5  HGB 11.2* 10.8* 11.2*  HCT 33.2* 32.0* 32.9*  PLT 113* 92* 101*    Coag's Recent Labs  Lab 10/15/17 2010  APTT 32  INR 1.25    Sepsis Markers Recent Labs  Lab 10/15/17 2018  LATICACIDVEN 1.2    ABG Recent Labs  Lab 10/16/17 1600  PHART 7.29*  PCO2ART 33  PO2ART 63*    Liver Enzymes Recent Labs  Lab 10/15/17 2010 10/17/17 0409  AST 50* 2,093*  ALT 19 603*  ALKPHOS 51 54  BILITOT 2.9* 2.9*  ALBUMIN 3.1* 3.1*    Cardiac Enzymes Recent Labs  Lab 10/16/17 1000 10/16/17 1612 10/17/17 0409  TROPONINI 0.18* 0.33* 1.58*    Glucose Recent Labs  Lab 10/16/17 0808 10/16/17 1031 10/16/17 2329 10/17/17 0724 10/17/17 1647 10/17/17 2342  GLUCAP 110* 106* 112* 114* 98  122*    CXR: CM, much improved pulmonary edema   ASSESSMENT / PLAN:  Acute hypoxemic respiratory failure cardiopulmonary to pulmonary edema. 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 Pending morning BMP most likely secondary to poor ejection fraction  Elevated LFTs - likely multifactorial, pending morning laboratory studies may reflect passive congestion or shock liver Pyuria Rash  - doubt tick related illness (rash present X 2 wks) Monitor temp, WBC count Micro and abx as above  DC'd doxycycline due to elevated LFTs  [place] care consultation pending  Patient with stable hemodynamics on room air, may be transferred to telemetry Will progress diet to clear liquids  Tora [facility], DO

## 2017-10-18 NOTE — Consult Note (Signed)
Derek Minium, MD [facility]  [address] [place], [place] [address] Phone: [phone] Fax : [phone]  Consultation  Referring Provider:     Dr. Amado Coe Primary Care Physician:  Joanna Hews, MD Primary Gastroenterologist:  Dr. Norma Fredrickson         Reason for Consultation:     Abnormal liver enzymes  Date of Admission:  10/15/2017 Date of Consultation:  10/18/2017         HPI:   Derek Barnes is a 67 y.o. male Who has a history of ulcerative colitis and was admitted with shortness of breath.  The patient was in the ICU.  The patient was treated in the ICU with antibiotics.  The patient was in respiratory distress and still reports that he is having trouble breathing.  The patient was on amiodarone drip in the ICU. The patient has a history of severe cardiomyopathy with an ejection fraction of 20-25%.  The patient was admitted with a rash and fevers with shortness of breath.  The patient's liver enzymes on admission were only slightly elevated but started to be elevated yesterday when they were checked with an AST over 2000 that came down to 1500 today and an ALT that was 600 yesterday and 800 today.  The patient's bilirubin has also increased since yesterday. The patient's bilirubin yesterday was 2.9 and it went up to 3.1.  Past Medical History:  Diagnosis Date  . CHF (congestive heart failure) (HCC)   . Chronic kidney disease (CKD)   . Colitis   . Decreased cardiac ejection fraction    EF 5-10%  . Dysrhythmia   . Hypertension   . Liver abscess   . Myocardial infarct (HCC)   . Nonischemic cardiomyopathy [facility])     Past Surgical History:  Procedure Laterality Date  . ACD DEFIBRILLATOR    . COLONOSCOPY    . COLONOSCOPY WITH PROPOFOL N/A 01/23/2017   Procedure: COLONOSCOPY WITH PROPOFOL;  Surgeon: Toledo, Boykin Nearing, MD;  Location: [facility] ENDOSCOPY;  Service: Gastroenterology;  Laterality: N/A;  . FRACTURE SURGERY    . HERNIA REPAIR    . INSERT / REPLACE / REMOVE PACEMAKER      . KNEE ARTHROSCOPY    . SHOULDER ARTHROSCOPY      Prior to Admission medications   Medication Sig Start Date End Date Taking? Authorizing Provider  amiodarone (PACERONE) 200 MG tablet Take 200 mg by mouth 2 (two) times daily.   Yes [provider]  atorvastatin (LIPITOR) 40 MG tablet Take 40 mg by mouth 2 (two) times daily.    Yes [provider]  Budesonide ER 9 MG TB24 Take 1 tablet by mouth daily. 03/31/17  Yes Wieting, Richard, MD  bumetanide (BUMEX) 1 MG tablet Take 1 mg by mouth daily.   Yes [provider]  digoxin (LANOXIN) 0.125 MG tablet Take 1 tablet by mouth daily.    Yes [provider]  eplerenone (INSPRA) 50 MG tablet Take 50 mg by mouth daily.   Yes [provider]  isosorbide mononitrate (IMDUR) 30 MG 24 hr tablet Take 30 mg by mouth daily.   Yes [provider]  mesalamine (LIALDA) 1.2 g EC tablet Take 2 tablets (2.4 g total) by mouth daily with breakfast. Patient taking differently: Take 4.8 g by mouth 2 (two) times daily.  04/01/17  Yes Wieting, Richard, MD  metFORMIN (GLUCOPHAGE) 500 MG tablet Take 500 mg by mouth 2 (two) times daily with a meal.  Yes [provider]  metoprolol succinate (TOPROL-XL) 50 MG 24 hr tablet Take 50 mg by mouth daily. Take with or immediately following a meal.   Yes [provider]  nitroGLYCERIN (NITROSTAT) 0.4 MG SL tablet Place 1 tablet under the tongue every 5 (five) minutes as needed. 01/19/17  Yes [provider]  sacubitril-valsartan (ENTRESTO) 24-26 MG Take 1 tablet by mouth 2 (two) times daily.   Yes [provider]  tamsulosin (FLOMAX) 0.4 MG CAPS capsule Take 1 capsule (0.4 mg total) by mouth daily. 03/19/17  Yes Schaevitz, Myra Rude, MD  insulin aspart (NOVOLOG) 100 UNIT/ML injection Inject 0-15 Units into the skin 3 (three) times daily before meals.    [provider]    Family History  Problem Relation Age of Onset  .  Cardiomyopathy Mother   . Prostate cancer Neg Hx   . Bladder Cancer Neg Hx   . Kidney cancer Neg Hx      Social History   Tobacco Use  . Smoking status: Never Smoker  . Smokeless tobacco: Never Used  Substance Use Topics  . Alcohol use: No  . Drug use: No    Allergies as of 10/15/2017 - Review Complete 10/15/2017  Allergen Reaction Noted  . Contrast media [iodinated diagnostic agents] Anaphylaxis 01/22/2017  . Penicillins Anaphylaxis 08/16/2014  . Sotalol Other (See Comments) 10/22/2013    Review of Systems:    All systems reviewed and negative except where noted in HPI.   Physical Exam:  Vital signs in last 24 hours: Temp:  [96.5 F (35.8 C)-97.7 F (36.5 C)] 97.7 F (36.5 C) (07/26 1523) Pulse Rate:  [72-115] 88 (07/26 1946) Resp:  [16-29] 18 (07/26 1946) BP: (93-122)/(67-99) 110/79 (07/26 1946) SpO2:  [84 %-100 %] 99 % (07/26 1946) Last BM Date: 10/15/17 General:   Pleasant, cooperative in NAD Head:  Normocephalic and atraumatic. Eyes:   No icterus.   Conjunctiva pink. PERRLA. Ears:  Normal auditory acuity. Neck:  Supple; no masses or thyroidomegaly Lungs: Respirations labored And rapid. Lungs clear to auscultation bilaterally.   No wheezes, crackles, or rhonchi.  Heart:  Regular rate and rhythm;  Without murmur, clicks, rubs or gallops Abdomen:  Soft, nondistended, nontender. Normal bowel sounds. No appreciable masses or hepatomegaly.  No rebound or guarding.  Rectal:  Not performed. Msk:  Symmetrical without gross deformities.    Extremities:  Without edema, cyanosis or clubbing. Neurologic:  Alert and oriented x3;  grossly normal neurologically. Skin:  Intact without significant lesions or rashes. Cervical Nodes:  No significant cervical adenopathy. Psych:  Alert and cooperative. Normal affect.  LAB RESULTS: Recent Labs    10/16/17 0754 10/17/17 0409 10/18/17 0847  WBC 4.3 7.5 4.0  HGB 10.8* 11.2* 10.0*  HCT 32.0* 32.9* 29.2*  PLT 92* 101* COUNT MAY  BE INACCURATE DUE TO FIBRIN CLUMPS.   BMET Recent Labs    10/17/17 0409 10/18/17 0847 10/18/17 1914  NA 140 135 133*  K 4.7 3.6 4.2  CL 109 107 104  CO2 20* 17* 12*  GLUCOSE 122* 144* 164*  BUN 36* 52* 62*  CREATININE 2.20* 2.55* 3.01*  CALCIUM 7.7* 7.5* 7.6*   LFT Recent Labs    10/18/17 0847  PROT 5.7*  ALBUMIN 3.0*  AST 1,589*  ALT 805*  ALKPHOS 52  BILITOT 3.2*   PT/INR No results for input(s): LABPROT, INR in the last 72 hours.  STUDIES: US Abdomen Limited  Result Date: 10/18/2017 CLINICAL DATA:  Abdominal distention. EXAM:  LIMITED ABDOMEN ULTRASOUND FOR ASCITES TECHNIQUE: Limited ultrasound survey for ascites was performed in all four abdominal quadrants. COMPARISON:  10/17/2017. FINDINGS: No significant ascites noted. IMPRESSION: No significant ascites noted. Electronically Signed   By: Maisie Fus  Register   On: 10/18/2017 17:11   Dg Chest Port 1 View  Result Date: 10/18/2017 CLINICAL DATA:  Respiratory failure. EXAM: PORTABLE CHEST 1 VIEW COMPARISON:  10/17/2017 FINDINGS: Cardiac pacemaker. Interval removal of endotracheal and enteric tubes. Right central venous catheter tip overlies the mid SVC region. Shallow inspiration. Cardiac enlargement. Since the previous study, there is increasing perihilar infiltration possibly representing edema or pneumonia. No pneumothorax. IMPRESSION: Cardiac enlargement with increasing perihilar infiltration. Electronically Signed   By: Burman Nieves M.D.   On: 10/18/2017 04:55   Dg Chest Port 1 View  Result Date: 10/17/2017 CLINICAL DATA:  Respiratory failure. EXAM: PORTABLE CHEST 1 VIEW COMPARISON:  10/16/2017. FINDINGS: Endotracheal tube and NG tube in stable position. Right subclavian line stable position. AICD in stable position. Stable cardiomegaly. Interim near complete clearing of bilateral pulmonary infiltrates/edema. No pleural effusion identified. Right costophrenic angle incompletely imaged. No pneumothorax. IMPRESSION: 1.   Lines and tubes stable position. 2. AICD in stable position. Stable cardiomegaly. Near complete clearing of bilateral pulmonary infiltrates/edema. Electronically Signed   By: Maisie Fus  Register   On: 10/17/2017 05:39   US Abdomen Limited Ruq  Result Date: 10/17/2017 CLINICAL DATA:  Elevated LFTs EXAM: ULTRASOUND ABDOMEN LIMITED RIGHT UPPER QUADRANT COMPARISON:  10/15/2017 FINDINGS: Gallbladder: Gallbladder sludge is again identified. No definitive cholelithiasis is seen. No wall thickening or pericholecystic fluid is noted. Common bile duct: Diameter: 3.5 mm. Liver: No focal lesion identified. Within normal limits in parenchymal echogenicity. Portal vein is patent on color Doppler imaging with normal direction of blood flow towards the liver. IMPRESSION: Gallbladder sludge without acute abnormality. Electronically Signed   By: Alcide Clever M.D.   On: 10/17/2017 12:24      Impression / Plan:   Assessment: Active Problems:   FUO (fever of unknown origin)   Congestive dilated cardiomyopathy (HCC)   Goals of care, counseling/discussion   Palliative care by specialist   Derek Barnes is a 67 y.o. y/o male with an admission for a rash with fevers and shortness of breath in a patient with severe cardiomyopathy. The patient was noted to have liver enzymes over thousand which is most consistent with shock liver from ischemia versus a medication reaction versus an acute viral illness.  Since the patient's liver enzymes were near normal on admission it is more likely that his increased lactic acid and hypotension may have resulted in his increased liver enzymes versus a that occasion he may have been exposed to.  Plan:  The patient's liver enzymes appear to be stabilizing and on their way down.  The liver enzymes are without skin likely related to a medication versus shock liver.  With his poor ejection fraction and his lactic acid being elevated the most likely reason is shock liver.  The patient should  be treated conservatively with keeping his blood pressure up and avoiding episodes of hypotension.  No further workup is needed at this time unless the liver enzymes do not continue to improve.  The patient has been explained the plan and agrees with it.  Thank you for involving me in the care of this patient.      LOS: 2 days   Derek Minium, MD  10/18/2017, 9:26 PM    Note: This dictation was prepared with Reubin Milan  dictation along with smaller phrase technology. Any transcriptional errors that result from this process are unintentional.

## 2017-10-18 NOTE — Progress Notes (Signed)
Patient moved to room 231 by bed with Chasity, NT.  A&Ox4.  Denies pain.  Dyspnea with minimal exertion.  Patient transferred on tele.

## 2017-10-18 NOTE — Progress Notes (Signed)
Dr. Amado Coe notified of ABG results.

## 2017-10-18 NOTE — Progress Notes (Signed)
PT Cancellation Note  Patient Details Name: Derek Barnes MRN: [id number] DOB: 08/11/1950   Cancelled Treatment:    Reason Eval/Treat Not Completed: Medical issues which prohibited therapy PT deferred Eval secondary to nurse request pt not feeling well and just transferred to floor. PT noted Echo was being preformed on pt as well. Will reattempt tomorrow.  Renaldo Harrison, SPT    Renaldo Harrison 10/18/2017, 2:41 PM

## 2017-10-18 NOTE — Progress Notes (Signed)
[facility] [place] NAME: Derek Barnes    MR#:  [id number]  DATE OF BIRTH:  [birth year]  SUBJECTIVE: Admitted to telemetry initially but had respiratory distress, wide-complex tachycardia yesterday so transferred to intensive care unit.  Patient respiratory status better, denies chest pain.  On amiodarone drip, phenylephrine for hypotension.  Discontinued amiodarone drip when patient got transferred to telemetry bed on 10/17/2017.  No further fever..  CHIEF COMPLAINT:   Chief Complaint  Patient presents with  . Shortness of Breath  . Nausea  Today patient is complaining of chest tightness but never smoked in his life.  Daughter and granddaughter at bedside.  Feels dizzy and lightheaded REVIEW OF SYSTEMS:    Review of Systems  Constitutional: Negative for chills and fever.  HENT: Negative for hearing loss.   Eyes: Negative for blurred vision, double vision and photophobia.  Respiratory: Positive for cough and shortness of breath. Negative for hemoptysis.   Cardiovascular: Negative for palpitations, orthopnea and leg swelling.  Gastrointestinal: Negative for abdominal pain, diarrhea and vomiting.  Genitourinary: Negative for dysuria and urgency.  Musculoskeletal: Negative for myalgias and neck pain.  Skin: Negative for rash.  Neurological: Negative for dizziness, focal weakness, seizures, weakness and headaches.  Psychiatric/Behavioral: Negative for memory loss. The patient does not have insomnia.    Nutrition:  Tolerating Diet: Tolerating PT:      DRUG ALLERGIES:   Allergies  Allergen Reactions  . Contrast Media [Iodinated Diagnostic Agents] Anaphylaxis  . Penicillins Anaphylaxis    Has patient had a PCN reaction causing immediate rash, facial/tongue/throat swelling, SOB or lightheadedness with hypotension: Yes Has patient had a PCN reaction causing severe rash involving mucus membranes or skin necrosis: Yes Has patient had  a PCN reaction that required hospitalization: No Has patient had a PCN reaction occurring within the last 10 years: No If all of the above answers are "NO", then may proceed with Cephalosporin use.   . Sotalol Other (See Comments)    Excessive fatigue    VITALS:  Blood pressure 105/75, pulse 88, temperature 97.7 F (36.5 C), temperature source Oral, resp. rate (!) 22, height _0  (1.727 m), weight 86.3 kg (190 lb 4.8 oz), SpO2 99 %.  PHYSICAL EXAMINATION:   Physical Exam  GENERAL:  67 y.o.-year-old patient lying in the bed with obviousrespiratory distress. EYES: Pupils equal, round, reactive to light and accommodation. No scleral icterus.  HEENT: Head atraumatic, normocephalic. Oropharynx and nasopharynx clear.  NECK:  Supple, no jugular venous distention. No thyroid enlargement, no tenderness.  LUNGS: Bibasilar crepitations.  Marland Kitchen  CARDIOVASCULAR:  S1, S2  Irregular. .  ABDOMEN: Soft, nontender, nondistended. Bowel sounds present. No organomegaly or mass.  EXTREMITIES:trace pedal edema..  Cyanosis, or clubbing.  NEUROLOGIC, awake, oriented.  Cranial nerves II through XII intact, 5/5 upper and lowerextremities/.  Sensations are intact.  DTRs 2+ bilaterally.   PSYCHIATRIC: The patient is alert and oriented x 3.  During my visit SKIN: No obvious rash, lesion, or ulcer.    LABORATORY PANEL:   CBC Recent Labs  Lab 10/18/17 0847  WBC 4.0  HGB 10.0*  HCT 29.2*  PLT COUNT MAY BE INACCURATE DUE TO FIBRIN CLUMPS.   ------------------------------------------------------------------------------------------------------------------  Chemistries  Recent Labs  Lab 10/18/17 0847  NA 135  K 3.6  CL 107  CO2 17*  GLUCOSE 144*  BUN 52*  CREATININE 2.55*  CALCIUM 7.5*  AST 1,589*  ALT 805*  ALKPHOS 52  BILITOT 3.2*   ------------------------------------------------------------------------------------------------------------------  Cardiac Enzymes Recent Labs  Lab  10/17/17 0409  TROPONINI 1.58*   ------------------------------------------------------------------------------------------------------------------  RADIOLOGY:  Dg Chest Port 1 View  Result Date: 10/18/2017 CLINICAL DATA:  Respiratory failure. EXAM: PORTABLE CHEST 1 VIEW COMPARISON:  10/17/2017 FINDINGS: Cardiac pacemaker. Interval removal of endotracheal and enteric tubes. Right central venous catheter tip overlies the mid SVC region. Shallow inspiration. Cardiac enlargement. Since the previous study, there is increasing perihilar infiltration possibly representing edema or pneumonia. No pneumothorax. IMPRESSION: Cardiac enlargement with increasing perihilar infiltration. Electronically Signed   By: Lucienne Capers M.D.   On: 10/18/2017 04:55   Dg Chest Port 1 View  Result Date: 10/17/2017 CLINICAL DATA:  Respiratory failure. EXAM: PORTABLE CHEST 1 VIEW COMPARISON:  10/16/2017. FINDINGS: Endotracheal tube and NG tube in stable position. Right subclavian line stable position. AICD in stable position. Stable cardiomegaly. Interim near complete clearing of bilateral pulmonary infiltrates/edema. No pleural effusion identified. Right costophrenic angle incompletely imaged. No pneumothorax. IMPRESSION: 1.  Lines and tubes stable position. 2. AICD in stable position. Stable cardiomegaly. Near complete clearing of bilateral pulmonary infiltrates/edema. Electronically Signed   By: Marcello Moores  Register   On: 10/17/2017 05:39   US Abdomen Limited Ruq  Result Date: 10/17/2017 CLINICAL DATA:  Elevated LFTs EXAM: ULTRASOUND ABDOMEN LIMITED RIGHT UPPER QUADRANT COMPARISON:  10/15/2017 FINDINGS: Gallbladder: Gallbladder sludge is again identified. No definitive cholelithiasis is seen. No wall thickening or pericholecystic fluid is noted. Common bile duct: Diameter: 3.5 mm. Liver: No focal lesion identified. Within normal limits in parenchymal echogenicity. Portal vein is patent on color Doppler imaging with normal  direction of blood flow towards the liver. IMPRESSION: Gallbladder sludge without acute abnormality. Electronically Signed   By: Inez Catalina M.D.   On: 10/17/2017 12:24     ASSESSMENT AND PLAN:   Active Problems:   FUO (fever of unknown origin)   Congestive dilated cardiomyopathy (HCC)   Goals of care, counseling/discussion   Palliative care by specialist  67 year old male patient with history of atrial fibrillation, V. tach, status post AICD placement and follows with [facility] cardiology, patient also has diabetes mellitus type 2, CVA admitted for maculopapular rash and fever and AICD firing.  Patient had respiratory distress with tachycardia yesterday, transferred to ICU.  Patient is on amiodarone drip, spoke with Dr. Clayborn Bigness from cardiology to see the patient..  # .  Acute respiratory distress secondary to acute pulmonary edema: Received 40 mg of IV Lasix one-time, transferred to stepdown unit for BiPAP support, patient has history of severe dilated cardiopathy, history of V. tach, patient received amiodarone loading/infusion, c and by Dr. Clayborn Bigness from cardiology she needs AICD interrogation.  Patient clinically improved and discontinued amiodarone drip and changed to p.o. Amiodarone.  Patient got transferred to floor today 10/17/2017   .#.  Atrial fibrillation: Patient is on amiodarone drip, heparin drip, follow   # acute renal failure patient has CKD stage III; slightly worse today.  Monitor closely.. Renal ultrasound and nephrology consult placed gentle hydration with IV fluids.  Monitor for symptoms and signs of fluid overload as the patient is with cardiomyopathy   #4  Sepsis met criteria with fever and tachycardia secondary to pneumonia and UTI  fever resolved  UTI, patient also had maculopapular rash s by Dr. Graylon Good from ID, recommend to continue Rocephin, doxycycline.  Blood cultures 1 out of 4 sets positive for staph species  urineCulture showing more than 100,000 colonies of E.  coli.  Blood cultures are no growth till date.  gentle hydration  with IV fluids and recheck lactic acid levels and procalcitonin Steroids and bronchodilators are added to the regimen for bronchospasm from underlying pneumonia  #Metabolic acidosis from sepsis, compensated with respiratory alkalosis gentle hydration with IV fluids and recheck lactic acid levels and procalcitonin   Acute on chronic systolic heart failure, BNP 705, cardiogram this time showed EF 10 to 15%.  status post AICD.  Continue, heparin drip because of V. tach episodes, Continue Entresto.  Patient on digoxin.   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 Slightly better, abdominal ultrasound and GI consult Patient is at high risk for cardiac arrest  Patient is at high risk for cardiac arrest with high mortality rate Consult palliative care.  Marland Kitchen  all the records are reviewed and case discussed with Care Management/Social Workerr. Management plans discussed with the patient, family and they are in agreement.  CODE STATUS:full  TOTAL TIME TAKING CARE OF THIS PATIENT: 39  minutes.   Patient is too unstable for discharge planning.  Patient high risk for cardiac arrest in critically ill at this time,. Discussed with patient's  Daughter like person  patient has no family, daughter is in the process of getting healthcare power of attorney papers, she needs help with chaplain.  Nicholes Mango M.D on 10/18/2017 at 4:09 PM  Between 7am to 6pm - Pager - [phone]   After 6pm go to [email or web address] - password [email or web address] [facility]  [facility]  Office  [phone]  CC: Primary care physician; Phineas Inches, MD

## 2017-10-18 NOTE — Progress Notes (Signed)
RN spoke with Dr. Lonn Georgia regarding patient's renal function and MD gave order to continue foley catheter for strict intake and output and to monitor renal function.

## 2017-10-18 NOTE — Progress Notes (Signed)
RN spoke with Dr. Lonn Georgia in person and made MD aware that patient stated he does not want to move to the floor because he feels worse today and that he wants to talk to the doctor.  Patient stated "I feel like I did before all this happened, I cant get my breath and I'm just weak."  Dr. Lonn Georgia stated he would see patient as soon as he can.

## 2017-10-18 NOTE — Care Management (Signed)
Patient moved out of icu to 2A today.  On amiodarone drip. Palliative care through [facility] is following patient as an outpatient.  PT consult pending. Patient does not wish to talk to CM this afternoon to discuss discharge disposition options

## 2017-10-18 NOTE — Progress Notes (Signed)
PHARMACIST - PHYSICIAN COMMUNICATION  CONCERNING: IV to Oral Route Change Policy  RECOMMENDATION: This patient is receiving famotidine by the intravenous route.  Based on criteria approved by the Pharmacy and Therapeutics Committee, the intravenous medication(s) is/are being converted to the equivalent oral dose form(s).   DESCRIPTION: These criteria include:  The patient is eating (either orally or via tube) and/or has been taking other orally administered medications for a least 24 hours  The patient has no evidence of active gastrointestinal bleeding or impaired GI absorption (gastrectomy, short bowel, patient on TNA or NPO).  If you have questions about this conversion, please contact the Pharmacy Department  []   [phone] )  [facility] [x]   [phone] )  [facility] []   [phone] )  [facility] []   [phone] )  [facility] []   [phone] )  [facility]   Simpson,Michael L, [facility] 10/18/2017 9:22 AM

## 2017-10-18 NOTE — Progress Notes (Signed)
CRITICAL VALUE ALERT  Critical Value:  Lactid Acid 5.9  Date & Time Notied:  10/18/17.1737  Provider Notified: Dr. Amado Coe  Orders Received/Actions taken:

## 2017-10-18 NOTE — Progress Notes (Signed)
New referral for outpatient Palliative to follow at home received from Palliative NP Harvest Dark following a  Goals of care meeting. CMRN Ermalene Searing made aware. Patient information faxed to referral. Dayna Barker RN, BSN, [facility] [place], hospital liaison [phone]

## 2017-10-18 NOTE — Progress Notes (Signed)
Report called to Alcario Drought, RN on 2A.

## 2017-10-18 NOTE — Progress Notes (Signed)
   10/18/17 1600  Clinical Encounter Type  Visited With Patient and family together  Visit Type Follow-up (advanced directive order)   Chaplain followed up on order regarding HCPOA; patient reported that he "already had that."  Chaplain reviewed on going chaplain availability and encourage patient to page as needed.

## 2017-10-19 LAB — CBC WITH DIFFERENTIAL/PLATELET
BASOS ABS: 0 10*3/uL (ref 0–0.1)
Basophils Relative: 0 %
Eosinophils Absolute: 0 10*3/uL (ref 0–0.7)
Eosinophils Relative: 0 %
HCT: 32.2 % — ABNORMAL LOW (ref 40.0–52.0)
Hemoglobin: 10.8 g/dL — ABNORMAL LOW (ref 13.0–18.0)
LYMPHS PCT: 7 %
Lymphs Abs: 0.4 10*3/uL — ABNORMAL LOW (ref 1.0–3.6)
MCH: 29 pg (ref 26.0–34.0)
MCHC: 33.5 g/dL (ref 32.0–36.0)
MCV: 86.5 fL (ref 80.0–100.0)
Monocytes Absolute: 0.5 10*3/uL (ref 0.2–1.0)
Monocytes Relative: 9 %
Neutro Abs: 4.6 10*3/uL (ref 1.4–6.5)
Neutrophils Relative %: 84 %
Platelets: 54 10*3/uL — ABNORMAL LOW (ref 150–440)
RBC: 3.73 MIL/uL — AB (ref 4.40–5.90)
RDW: 17.3 % — ABNORMAL HIGH (ref 11.5–14.5)
WBC: 5.5 10*3/uL (ref 3.8–10.6)

## 2017-10-19 LAB — HEPATIC FUNCTION PANEL
ALT: 1183 U/L — AB (ref 0–44)
AST: 2498 U/L — AB (ref 15–41)
Albumin: 2.9 g/dL — ABNORMAL LOW (ref 3.5–5.0)
Alkaline Phosphatase: 58 U/L (ref 38–126)
Bilirubin, Direct: 2 mg/dL — ABNORMAL HIGH (ref 0.0–0.2)
Indirect Bilirubin: 1.8 mg/dL — ABNORMAL HIGH (ref 0.3–0.9)
Total Bilirubin: 3.8 mg/dL — ABNORMAL HIGH (ref 0.3–1.2)
Total Protein: 5.3 g/dL — ABNORMAL LOW (ref 6.5–8.1)

## 2017-10-19 LAB — PROCALCITONIN: Procalcitonin: 23.2 ng/mL

## 2017-10-19 LAB — BASIC METABOLIC PANEL
Anion gap: 14 (ref 5–15)
BUN: 71 mg/dL — AB (ref 8–23)
CHLORIDE: 107 mmol/L (ref 98–111)
CO2: 14 mmol/L — AB (ref 22–32)
Calcium: 7.5 mg/dL — ABNORMAL LOW (ref 8.9–10.3)
Creatinine, Ser: 3.22 mg/dL — ABNORMAL HIGH (ref 0.61–1.24)
GFR calc Af Amer: 22 mL/min — ABNORMAL LOW (ref >60)
GFR calc non Af Amer: 19 mL/min — ABNORMAL LOW (ref >60)
Glucose, Bld: 202 mg/dL — ABNORMAL HIGH (ref 70–99)
Potassium: 4.1 mmol/L (ref 3.5–5.1)
Sodium: 135 mmol/L (ref 135–145)

## 2017-10-19 LAB — LYME DISEASE DNA BY PCR(BORRELIA BURG): Lyme Disease(B.burgdorferi)PCR: NEGATIVE

## 2017-10-19 LAB — GLUCOSE, CAPILLARY: Glucose-Capillary: 177 mg/dL — ABNORMAL HIGH (ref 70–99)

## 2017-10-19 LAB — LACTIC ACID, PLASMA: Lactic Acid, Venous: 1.7 mmol/L (ref 0.5–1.9)

## 2017-10-19 NOTE — Progress Notes (Signed)
Derek Barnes at [facility] NAME: Derek Barnes    MR#:  [id number]  DATE OF BIRTH:  1950-09-23  SUBJECTIVE: Admitted to telemetry initially but had respiratory distress, wide-complex tachycardia yesterday so transferred to intensive care unit.  Patient respiratory status better, denies chest pain.  On amiodarone drip, phenylephrine for hypotension.  Discontinued amiodarone drip when patient got transferred to telemetry bed on 10/17/2017.  No further fever..  CHIEF COMPLAINT:   Chief Complaint  Patient presents with  . Shortness of Breath  . Nausea  Today patient is feeling much better than yesterday.  Chest tightness significantly improved.  Denies any chest pain or abdominal pain or shortness of breath  REVIEW OF SYSTEMS:    Review of Systems  Constitutional: Negative for chills and fever.  HENT: Negative for hearing loss.   Eyes: Negative for blurred vision, double vision and photophobia.  Respiratory: Positive for cough and shortness of breath. Negative for hemoptysis.   Cardiovascular: Negative for palpitations, orthopnea and leg swelling.  Gastrointestinal: Negative for abdominal pain, diarrhea and vomiting.  Genitourinary: Negative for dysuria and urgency.  Musculoskeletal: Negative for myalgias and neck pain.  Skin: Negative for rash.  Neurological: Negative for dizziness, focal weakness, seizures, weakness and headaches.  Psychiatric/Behavioral: Negative for memory loss. The patient does not have insomnia.    Nutrition:  Tolerating Diet: Tolerating PT:      DRUG ALLERGIES:   Allergies  Allergen Reactions  . Contrast Media [Iodinated Diagnostic Agents] Anaphylaxis  . Penicillins Anaphylaxis    Has patient had a PCN reaction causing immediate rash, facial/tongue/throat swelling, SOB or lightheadedness with hypotension: Yes Has patient had a PCN reaction causing severe rash involving mucus membranes or skin necrosis: Yes Has  patient had a PCN reaction that required hospitalization: No Has patient had a PCN reaction occurring within the last 10 years: No If all of the above answers are "NO", then may proceed with Cephalosporin use.   . Sotalol Other (See Comments)    Excessive fatigue    VITALS:  Blood pressure 124/73, pulse 76, temperature (!) 97.4 F (36.3 C), resp. rate 16, height _0  (1.727 m), weight 90.4 kg (199 lb 3.2 oz), SpO2 100 %.  PHYSICAL EXAMINATION:   Physical Exam  GENERAL:  67 y.o.-year-old patient lying in the bed with obviousrespiratory distress. EYES: Pupils equal, round, reactive to light and accommodation. No scleral icterus.  HEENT: Head atraumatic, normocephalic. Oropharynx and nasopharynx clear.  NECK:  Supple, no jugular venous distention. No thyroid enlargement, no tenderness.  LUNGS: Bibasilar crepitations.  Marland Kitchen  CARDIOVASCULAR:  S1, S2  Irregular. .  ABDOMEN: Soft, nontender, nondistended. Bowel sounds present. No organomegaly or mass.  EXTREMITIES:trace pedal edema..  Cyanosis, or clubbing.  NEUROLOGIC, awake, oriented.  Cranial nerves II through XII intact, 5/5 upper and lowerextremities/.  Sensations are intact.  DTRs 2+ bilaterally.   PSYCHIATRIC: The patient is alert and oriented x 3.  During my visit SKIN: No obvious rash, lesion, or ulcer.    LABORATORY PANEL:   CBC Recent Labs  Lab 10/19/17 0627  WBC 5.5  HGB 10.8*  HCT 32.2*  PLT 54*   ------------------------------------------------------------------------------------------------------------------  Chemistries  Recent Labs  Lab 10/19/17 0627  NA 135  K 4.1  CL 107  CO2 14*  GLUCOSE 202*  BUN 71*  CREATININE 3.22*  CALCIUM 7.5*  AST 2,498*  ALT 1,183*  ALKPHOS 58  BILITOT 3.8*   ------------------------------------------------------------------------------------------------------------------  Cardiac Enzymes Recent Labs  Lab 10/17/17 0409  TROPONINI 1.58*    ------------------------------------------------------------------------------------------------------------------  RADIOLOGY:  US Abdomen Limited  Result Date: 10/18/2017 CLINICAL DATA:  Abdominal distention. EXAM: LIMITED ABDOMEN ULTRASOUND FOR ASCITES TECHNIQUE: Limited ultrasound survey for ascites was performed in all four abdominal quadrants. COMPARISON:  10/17/2017. FINDINGS: No significant ascites noted. IMPRESSION: No significant ascites noted. Electronically Signed   By: Marcello Moores  Register   On: 10/18/2017 17:11   Dg Chest Port 1 View  Result Date: 10/18/2017 CLINICAL DATA:  Respiratory failure. EXAM: PORTABLE CHEST 1 VIEW COMPARISON:  10/17/2017 FINDINGS: Cardiac pacemaker. Interval removal of endotracheal and enteric tubes. Right central venous catheter tip overlies the mid SVC region. Shallow inspiration. Cardiac enlargement. Since the previous study, there is increasing perihilar infiltration possibly representing edema or pneumonia. No pneumothorax. IMPRESSION: Cardiac enlargement with increasing perihilar infiltration. Electronically Signed   By: Lucienne Capers M.D.   On: 10/18/2017 04:55     ASSESSMENT AND PLAN:   Active Problems:   FUO (fever of unknown origin)   Congestive dilated cardiomyopathy (HCC)   Goals of care, counseling/discussion   Palliative care by specialist   Elevated liver enzymes  67 year old male patient with history of atrial fibrillation, V. tach, status post AICD placement and follows with [facility] cardiology, patient also has diabetes mellitus type 2, CVA admitted for maculopapular rash and fever and AICD firing.  Patient had respiratory distress with tachycardia yesterday, transferred to ICU.     #Shock liver secondary to congestive hepatopathy:  Continue monitoring LFTs-getting worse  abdominal ultrasound no ascites  GI  following.  Checking for HSV, Epstein-Barr virus and CMV  # .  Acute respiratory distress secondary to acute pulmonary edema:  Received 40 mg of IV Lasix one-time, transferred to stepdown unit for BiPAP support, patient has history of severe dilated cardiopathy, history of V. tach, patient received amiodarone loading/infusion, c and by Dr. Clayborn Bigness from cardiology she needs AICD interrogation.  Patient clinically improved and discontinued amiodarone drip and changed to p.o. Amiodarone.  Patient got transferred to floor today 10/17/2017  #Thrombocytopenia could be from sepsis or heparin-induced Check HIT panel Discontinued heparin No active bleeding or bruising  #.  Atrial fibrillation: Patient was on amiodarone drip, heparin drip, both are discontinued currently rate controlled.  Amiodarone changed to p.o.  # acute renal failure patient has CKD stage III; slightly worse today. BUN 62-71 Creatinine 3.01-3.22  monitor closely..  Renal ultrasound and nephrology consult placed  status post gentle hydration with IV fluids.  Monitor for symptoms and signs of fluid overload as the patient is with cardiomyopathy  #Sepsis met criteria with fever and tachycardia secondary to pneumonia and UTI  fever resolved  UTI, patient also had maculopapular rash s by Dr. Graylon Good from ID, recommend to continue Rocephin, doxycycline.  Blood cultures 1 out of 4 sets positive for staph species  urineCulture showing more than 100,000 colonies of E. coli.  Blood cultures are no growth till date.  gentle hydration with IV fluids provided and recheck lactic acid levels and procalcitonin Steroids and bronchodilators are added to the regimen for bronchospasm from underlying pneumonia  #Metabolic acidosis from sepsis, compensated with respiratory alkalosis gentle hydration with IV fluids provided and recheck lactic acid levels and procalcitonin   Acute on chronic systolic heart failure, BNP 705, cardiogram this time showed EF 10 to 15%.  status post AICD.  Continue, heparin drip because of V. tach episodes, Continue Entresto.  Patient on  digoxin.    Patient is at high risk  for cardiac arrest with high mortality rate Consult palliative care.  Marland Kitchen  all the records are reviewed and case discussed with Care Management/Social Workerr. Management plans discussed with the patient, family and they are in agreement.  CODE STATUS:full  TOTAL TIME TAKING CARE OF THIS PATIENT: 39  minutes.   Patient is too unstable for discharge planning.  Patient high risk for cardiac arrest in critically ill at this time,. Discussed with patient's  Daughter like person  patient has no family, daughter is in the process of getting healthcare power of attorney papers, she needs help with chaplain.  Nicholes Mango M.D on 10/19/2017 at 9:49 PM  Between 7am to 6pm - Pager - [phone]   After 6pm go to [email or web address] - password [email or web address] [facility]  [facility]  Office  [phone]  CC: Primary care physician; Phineas Inches, MD

## 2017-10-19 NOTE — Evaluation (Signed)
Physical Therapy Evaluation Patient Details Name: Derek Barnes MRN: [id number] DOB: [birth year] Today's Date: 10/19/2017   History of Present Illness  Derek Barnes is a 67yo male who comes [facility] on 7/23 with BLE rash, and associated dyspnea, respiratory distress, and tachycardia. Pt required intubation, (self extubated) , and also noted to have a UTI c e Coli. 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 PMH: severe cardiomyopathy with 10% EF, AF, AICD, DM, HTN, CVA, CKD.   Clinical Impression  Pt admitted with above diagnosis. Pt currently with functional limitations due to the deficits listed below (see "PT Problem List"). Upon entry, pt in bed, visitors present. The pt is awake and agreeable to participate. The pt is alert and oriented x3, pleasant, conversational, and following simple commands consistently. Pt requires heavy effort to mobilize to EOB and maximal effort to stand. Close supervision to minGuard Assist is needed d/t acute balance impairment. Functional mobility assessment demonstrates increased effort/time requirements, poor tolerance, and need for physical assistance, whereas the patient performed these at a higher level of independence PTA. Pt c/o dyspnea throughout session, but maintains O2 sats 99-100% on room air throughout. Pt is not AMB safely enough and not tolerating basic mobility well enough for safe and independent DC to home. Pt will benefit from skilled PT intervention to increase independence and safety with basic mobility in preparation for discharge to the venue listed below.       Follow Up Recommendations SNF;Supervision/Assistance - 24 hour    Equipment Recommendations  Other (comment)(TO be determined by facility)    Recommendations for Other Services       Precautions / Restrictions Precautions Precautions: Fall Restrictions Weight Bearing Restrictions: No      Mobility  Bed Mobility Overal bed mobility: Modified Independent             General bed mobility comments: additional time needed and heavy effort.   Transfers Overall transfer level: Needs assistance Equipment used: None Transfers: Sit to/from Stand Sit to Stand: Min guard         General transfer comment: quite weak, difficulty powering up and maintaining balance. Motivated to perform without assistance. 4x in session. Tolerates standing for BP assessment.   Ambulation/Gait Ambulation/Gait assistance: Min assist;Min guard Gait Distance (Feet): 12 Feet Assistive device: None Gait Pattern/deviations: Wide base of support;Staggering left;Staggering right;Drifts right/left;Ataxic     General Gait Details: very weak and ataxic. Dfficulty lifting legs. Yellow socks make this worse.   Stairs            Wheelchair Mobility    Modified Rankin (Stroke Patients Only)       Balance Overall balance assessment: Needs assistance   Sitting balance-Leahy Scale: Normal     Standing balance support: During functional activity Standing balance-Leahy Scale: Fair Standing balance comment: intermittent UE support needed when moving.                              Pertinent Vitals/Pain Pain Assessment: No/denies pain    Home Living Family/patient expects to be discharged to:: Private residence Living Arrangements: Alone Available Help at Discharge: Family(daughter adn various other support. 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 )    Cervical / Trunk Assessment Cervical / Trunk Assessment: Normal  Communication      Cognition Arousal/Alertness: Awake/alert Behavior During Therapy: WFL for tasks assessed/performed Overall Cognitive Status: Within Functional Limits for tasks assessed                                        General Comments      Exercises     Assessment/Plan    PT Assessment Patient needs continued PT services  PT Problem List Decreased strength;Decreased activity tolerance;Cardiopulmonary status limiting activity;Decreased mobility;Decreased balance;Decreased knowledge of use of DME       PT Treatment Interventions DME instruction;Balance training;Gait training;Functional mobility training;Therapeutic activities;Therapeutic exercise    PT Goals (Current goals can be found in the Care Plan section)  Acute Rehab PT Goals Patient Stated Goal: Regain independence in mobility PT Goal Formulation: With patient Time For Goal Achievement: 11/02/17 Potential to Achieve Goals: Fair    Frequency Min 2X/week   Barriers to discharge Inaccessible home environment      Co-evaluation               AM-PAC PT "6 Clicks" Daily Activity  Outcome Measure Difficulty turning over in bed (including adjusting bedclothes, sheets and blankets)?: A Little Difficulty moving from lying on back to sitting on the side of the bed? : A Lot Difficulty sitting down on and standing up from a chair with arms (e.g., wheelchair, bedside commode, etc,.)?: A Lot Help needed moving to and from a bed to chair (including a wheelchair)?: A Little Help needed walking in hospital room?: A Little Help needed climbing 3-5 steps with a railing? : A Lot 6 Click Score: 15    End of Session Equipment Utilized During Treatment: Gait  belt;Oxygen Activity Tolerance: Patient limited by fatigue(limited by SOB) Patient left: in chair;with chair alarm set;with call bell/phone within reach   PT Visit Diagnosis: Unsteadiness on feet (R26.81);Other abnormalities of gait and mobility (R26.89);Muscle weakness (generalized) (M62.81);Ataxic gait (R26.0)    Time: 1610-9604 PT Time Calculation (min) (ACUTE ONLY): 43 min   Charges:   PT Evaluation $PT Eval High Complexity: 1 High PT Treatments $Therapeutic Activity: 8-22 mins        3:31 PM, 10/19/17 Rosamaria Lints, PT, DPT Physical Therapist - [facility]  [phone] (ASCOM)     Ellamae Lybeck C 10/19/2017, 3:28 PM

## 2017-10-19 NOTE — Consult Note (Signed)
Date: 10/19/2017                  Patient Name:  Derek Barnes  MRN: [id number]  DOB: Dec 07, 1950  Age / Sex: 67 y.o., male         PCP: Phineas Inches, MD                 Service Requesting Consult: im/ Nicholes Mango, MD                 Reason for Consult:  Acute renal failure            History of Present Illness: Patient is a 67 y.o. 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  Hospital course complicated by wide-complex tachycardia, severe respiratory distress, transferred to ICU, ventilator support.  Patient self extubated. Serum creatinine is noted to continually worsen therefore nephrology consult has been requested    Medications: Outpatient medications: Medications Prior to Admission  Medication Sig Dispense Refill Last Dose  . amiodarone (PACERONE) 200 MG tablet Take 200 mg by mouth 2 (two) times daily.   10/15/2017 at 0900  . atorvastatin (LIPITOR) 40 MG tablet Take 40 mg by mouth 2 (two) times daily.    10/15/2017 at 0900  . Budesonide ER 9 MG TB24 Take 1 tablet by mouth daily. 30 tablet 0 10/15/2017 at 0900  . bumetanide (BUMEX) 1 MG tablet Take 1 mg by mouth daily.   10/15/2017 at 0900  . digoxin (LANOXIN) 0.125 MG tablet Take 1 tablet by mouth daily.    10/15/2017 at 0900  . eplerenone (INSPRA) 50 MG tablet Take 50 mg by mouth daily.   10/15/2017 at 0900  . isosorbide mononitrate (IMDUR) 30 MG 24 hr tablet Take 30 mg by mouth daily.   10/15/2017 at 0900  . mesalamine (LIALDA) 1.2 g EC tablet Take 2 tablets (2.4 g total) by mouth daily with breakfast. (Patient taking differently: Take 4.8 g by mouth 2 (two) times daily. ) 60 tablet 0 10/15/2017 at 0900  . metFORMIN (GLUCOPHAGE) 500 MG tablet Take 500 mg by mouth 2 (two) times daily  with a meal.    10/15/2017 at 0900  . metoprolol succinate (TOPROL-XL) 50 MG 24 hr tablet Take 50 mg by mouth daily. Take with or immediately following a meal.   10/15/2017 at 0900  . nitroGLYCERIN (NITROSTAT) 0.4 MG SL tablet Place 1 tablet under the tongue every 5 (five) minutes as needed.  0 prn at prn  . sacubitril-valsartan (ENTRESTO) 24-26 MG Take 1 tablet by mouth 2 (two) times daily.   10/15/2017 at 0900  . tamsulosin (FLOMAX) 0.4 MG CAPS capsule Take 1 capsule (0.4 mg total) by mouth daily. 30 capsule 0 10/15/2017 at 0900  . insulin aspart (NOVOLOG) 100 UNIT/ML injection Inject 0-15 Units into the skin 3 (three) times daily before meals.   Not Taking at Unknown time    Current medications: Current Facility-Administered Medications  Medication Dose Route Frequency Provider Last Rate Last Dose  . 0.9 %  sodium chloride infusion   Intravenous Continuous Gouru, Aruna, MD 50 mL/hr at 10/18/17 1623    . albuterol (PROVENTIL) (2.5 MG/3ML) 0.083% nebulizer solution 2.5 mg  2.5 mg Nebulization Q4H PRN Gouru, Aruna, MD   2.5 mg  at 10/18/17 1356  . ALPRAZolam Duanne Moron) tablet 0.25 mg  0.25 mg Oral BID PRN Gouru, Aruna, MD   0.25 mg at 10/18/17 1624  . amiodarone (PACERONE) tablet 200 mg  200 mg Oral Daily Wilhelmina Mcardle, MD   200 mg at 10/18/17 1100  . aspirin chewable tablet 81 mg  81 mg Oral Daily Wilhelmina Mcardle, MD   81 mg at 10/18/17 1100  . bisacodyl (DULCOLAX) suppository 10 mg  10 mg Rectal Daily PRN Wilhelmina Mcardle, MD      . cefTRIAXone (ROCEPHIN) 1 g in sodium chloride 0.9 % 100 mL IVPB  1 g Intravenous Q24H Gouru, Aruna, MD      . chlorhexidine gluconate (MEDLINE KIT) (PERIDEX) 0.12 % solution 15 mL  15 mL Mouth Rinse BID Awilda Bill, NP   15 mL at 10/18/17 1931  . digoxin (LANOXIN) tablet 125 mcg  125 mcg Oral Dub Mikes, MD      . docusate (COLACE) 50 MG/5ML liquid 100 mg  100 mg Per Tube BID PRN Wilhelmina Mcardle, MD      . famotidine (PEPCID) tablet 20 mg  20 mg Oral  Daily Gouru, Aruna, MD   20 mg at 10/18/17 1100  . heparin injection 5,000 Units  5,000 Units Subcutaneous Q8H Flora Lipps, MD   5,000 Units at 10/19/17 0518  . ipratropium-albuterol (DUONEB) 0.5-2.5 (3) MG/3ML nebulizer solution 3 mL  3 mL Nebulization Q4H PRN Wilhelmina Mcardle, MD      . methylPREDNISolone sodium succinate (SOLU-MEDROL) 125 mg/2 mL injection 60 mg  60 mg Intravenous Q8H Gouru, Aruna, MD   60 mg at 10/19/17 0517  . metoprolol tartrate (LOPRESSOR) injection 2.5 mg  2.5 mg Intravenous Q3H PRN Wilhelmina Mcardle, MD      . metoprolol tartrate (LOPRESSOR) tablet 25 mg  25 mg Oral BID Wilhelmina Mcardle, MD   25 mg at 10/18/17 2035  . nitroGLYCERIN (NITRODUR - Dosed in mg/24 hr) patch 0.3 mg  0.3 mg Transdermal Daily Wilhelmina Mcardle, MD   0.3 mg at 10/18/17 1102  . nitroGLYCERIN (NITROSTAT) SL tablet 0.4 mg  0.4 mg Sublingual Q5 min PRN Arta Silence, MD   0.4 mg at 10/18/17 0730  . ondansetron (ZOFRAN) injection 4 mg  4 mg Intravenous Q6H PRN Arta Silence, MD      . senna-docusate (Senokot-S) tablet 1 tablet  1 tablet Oral QHS PRN Arta Silence, MD      . traZODone (DESYREL) tablet 50 mg  50 mg Oral QHS PRN Awilda Bill, NP   50 mg at 10/17/17 2207      Allergies: Allergies  Allergen Reactions  . Contrast Media [Iodinated Diagnostic Agents] Anaphylaxis  . Penicillins Anaphylaxis    Has patient had a PCN reaction causing immediate rash, facial/tongue/throat swelling, SOB or lightheadedness with hypotension: Yes Has patient had a PCN reaction causing severe rash involving mucus membranes or skin necrosis: Yes Has patient had a PCN reaction that required hospitalization: No Has patient had a PCN reaction occurring within the last 10 years: No If all of the above answers are "NO", then may proceed with Cephalosporin use.   . Sotalol Other (See Comments)    Excessive fatigue      Past Medical History: Past Medical History:  Diagnosis Date  . CHF  (congestive heart failure) ([place])   . Chronic kidney disease (CKD)   . Colitis   . Decreased cardiac ejection fraction  EF 5-10%  . Dysrhythmia   . Hypertension   . Liver abscess   . Myocardial infarct ([place])   . Nonischemic cardiomyopathy [facility])      Past Surgical History: Past Surgical History:  Procedure Laterality Date  . ACD DEFIBRILLATOR    . COLONOSCOPY    . COLONOSCOPY WITH PROPOFOL N/A 01/23/2017   Procedure: COLONOSCOPY WITH PROPOFOL;  Surgeon: Toledo, Benay Pike, MD;  Location: [facility] ENDOSCOPY;  Service: Gastroenterology;  Laterality: N/A;  . FRACTURE SURGERY    . HERNIA REPAIR    . INSERT / REPLACE / REMOVE PACEMAKER    . KNEE ARTHROSCOPY    . SHOULDER ARTHROSCOPY       Family History: Family History  Problem Relation Age of Onset  . Cardiomyopathy Mother   . Prostate cancer Neg Hx   . Bladder Cancer Neg Hx   . Kidney cancer Neg Hx      Social History: Social History   Socioeconomic History  . Marital status: Divorced    Spouse name: Not on file  . Number of children: Not on file  . Years of education: Not on file  . Highest education level: Not on file  Occupational History  . Not on file  Social Needs  . Financial resource strain: Not on file  . Food insecurity:    Worry: Not on file    Inability: Not on file  . Transportation needs:    Medical: Not on file    Non-medical: Not on file  Tobacco Use  . Smoking status: Never Smoker  . Smokeless tobacco: Never Used  Substance and Sexual Activity  . Alcohol use: No  . Drug use: No  . Sexual activity: Not Currently  Lifestyle  . Physical activity:    Days per week: Not on file    Minutes per session: Not on file  . Stress: Not on file  Relationships  . Social connections:    Talks on phone: Not on file    Gets together: Not on file    Attends religious service: Not on file    Active member of club or organization: Not on file    Attends meetings of clubs or organizations: Not on file     Relationship status: Not on file  . Intimate partner violence:    Fear of current or ex partner: Not on file    Emotionally abused: Not on file    Physically abused: Not on file    Forced sexual activity: Not on file  Other Topics Concern  . Not on file  Social History Narrative   Independent and active at baseline.     Review of Systems: Gen: Fever prior to admission, none at present HEENT: Denies vision or hearing problems CV: Chronic shortness of breath, chronic congestive heart failure Resp: No cough or sputum or hemoptysis at present GI: Appetite is good GU : Currently has a Foley catheter MS: Denies any joint pains or swellings Derm:  Rash over lower legs bilaterally, improving Psych: No complaints Heme: No complaints Neuro: No complaints Endocrine: No complaints  Vital Signs: Blood pressure 109/78, pulse 80, temperature (!) 97.4 F (36.3 C), temperature source Rectal, resp. rate 16, height '5\' 8"'$  (1.727 m), weight 90.4 kg (199 lb 3.2 oz), SpO2 99 %.   Intake/Output Summary (Last 24 hours) at 10/19/2017 1003 Last data filed at 10/19/2017 0347 Gross per 24 hour  Intake 709.17 ml  Output 1425 ml  Net -715.83 ml    Weight  trends: Filed Weights   10/16/17 1017 10/16/17 1155 10/19/17 0357  Weight: 86.3 kg (190 lb 4.8 oz) 86.3 kg (190 lb 4.8 oz) 90.4 kg (199 lb 3.2 oz)    Physical Exam: General:  No acute distress, sitting up on the side of bed  HEENT  anicteric, moist oral mucous membranes  Neck:  Supple  Lungs:  Clear to auscultation bilaterally  Heart::  Irregular rhythm, no rub  Abdomen:  Soft,  Extremities:  No peripheral edema  Neurologic:  Alert, oriented  Skin:  Maculopapular rash, healing, bilateral legs up to upper thighs  Foley:  Present       Lab results: Basic Metabolic Panel: Recent Labs  Lab 10/18/17 0847 10/18/17 1914 10/19/17 0627  NA 135 133* 135  K 3.6 4.2 4.1  CL 107 104 107  CO2 17* 12* 14*  GLUCOSE 144* 164* 202*  BUN 52* 62*  71*  CREATININE 2.55* 3.01* 3.22*  CALCIUM 7.5* 7.6* 7.5*    Liver Function Tests: Recent Labs  Lab 10/18/17 0847  AST 1,589*  ALT 805*  ALKPHOS 52  BILITOT 3.2*  PROT 5.7*  ALBUMIN 3.0*   Recent Labs  Lab 10/15/17 2010  LIPASE 26   No results for input(s): AMMONIA in the last 168 hours.  CBC: Recent Labs  Lab 10/15/17 2010  10/17/17 0409 10/18/17 0847  WBC 8.0   < > 7.5 4.0  NEUTROABS 6.6*  --   --   --   HGB 11.2*   < > 11.2* 10.0*  HCT 33.2*   < > 32.9* 29.2*  MCV 86.6   < > 87.2 86.3  PLT 113*   < > 101* COUNT MAY BE INACCURATE DUE TO FIBRIN CLUMPS.   < > = values in this interval not displayed.    Cardiac Enzymes: Recent Labs  Lab 10/17/17 0409  TROPONINI 1.58*    BNP: Invalid input(s): POCBNP  CBG: Recent Labs  Lab 10/17/17 2342 10/18/17 0726 10/18/17 1654 10/18/17 2358 10/19/17 0735  GLUCAP 122* 109* 174* 171* 177*    Microbiology: Recent Results (from the past 720 hour(s))  Blood Culture (routine x 2)     Status: Abnormal   Collection Time: 10/15/17  8:10 PM  Result Value Ref Range Status   Specimen Description   Final    BLOOD LEFT ANTECUBITAL Performed at [facility], [address], [place], [place] [address]    Special Requests   Final    BOTTLES DRAWN AEROBIC AND ANAEROBIC Blood Culture adequate volume Performed at [facility], [address] [place], [place] [address]    Culture  Setup Time   Final    AEROBIC BOTTLE ONLY GRAM POSITIVE COCCI CRITICAL RESULT CALLED TO, READ BACK BY AND VERIFIED WITH: SHEEMA HALLAJI 10/16/17 1437 KLW    Culture (A)  Final    STAPHYLOCOCCUS SPECIES (COAGULASE NEGATIVE) THE SIGNIFICANCE OF ISOLATING THIS ORGANISM FROM A SINGLE SET OF BLOOD CULTURES WHEN MULTIPLE SETS ARE DRAWN IS UNCERTAIN. PLEASE NOTIFY THE MICROBIOLOGY DEPARTMENT WITHIN ONE WEEK IF SPECIATION AND SENSITIVITIES ARE REQUIRED. Performed at [facility], [place] [address], [place], [place] [address]     Report Status 10/18/2017 FINAL  Final  Blood Culture (routine x 2)     Status: None (Preliminary result)   Collection Time: 10/15/17  8:10 PM  Result Value Ref Range Status   Specimen Description BLOOD BLOOD RIGHT WRIST  Final   Special Requests   Final    BOTTLES  DRAWN AEROBIC AND ANAEROBIC Blood Culture adequate volume   Culture   Final    NO GROWTH 4 DAYS Performed at [facility], [address] [place], [place] [address]    Report Status PENDING  Incomplete  Blood Culture ID Panel (Reflexed)     Status: Abnormal   Collection Time: 10/15/17  8:10 PM  Result Value Ref Range Status   Enterococcus species NOT DETECTED NOT DETECTED Final   Listeria monocytogenes NOT DETECTED NOT DETECTED Final   Staphylococcus species DETECTED (A) NOT DETECTED Final    Comment: Methicillin (oxacillin) susceptible coagulase negative staphylococcus. Possible blood culture contaminant (unless isolated from more than one blood culture draw or clinical case suggests pathogenicity). No antibiotic treatment is indicated for blood  culture contaminants. CRITICAL RESULT CALLED TO, READ BACK BY AND VERIFIED WITH: SHEEMA HALLAJI 10/16/17 1437 KLW    Staphylococcus aureus NOT DETECTED NOT DETECTED Final   Methicillin resistance NOT DETECTED NOT DETECTED Final   Streptococcus species NOT DETECTED NOT DETECTED Final   Streptococcus agalactiae NOT DETECTED NOT DETECTED Final   Streptococcus pneumoniae NOT DETECTED NOT DETECTED Final   Streptococcus pyogenes NOT DETECTED NOT DETECTED Final   Acinetobacter baumannii NOT DETECTED NOT DETECTED Final   Enterobacteriaceae species NOT DETECTED NOT DETECTED Final   Enterobacter cloacae complex NOT DETECTED NOT DETECTED Final   Escherichia coli NOT DETECTED NOT DETECTED Final   Klebsiella oxytoca NOT DETECTED NOT DETECTED Final   Klebsiella pneumoniae NOT DETECTED NOT DETECTED Final   Proteus species NOT DETECTED NOT DETECTED Final   Serratia marcescens NOT  DETECTED NOT DETECTED Final   Haemophilus influenzae NOT DETECTED NOT DETECTED Final   Neisseria meningitidis NOT DETECTED NOT DETECTED Final   Pseudomonas aeruginosa NOT DETECTED NOT DETECTED Final   Candida albicans NOT DETECTED NOT DETECTED Final   Candida glabrata NOT DETECTED NOT DETECTED Final   Candida krusei NOT DETECTED NOT DETECTED Final   Candida parapsilosis NOT DETECTED NOT DETECTED Final   Candida tropicalis NOT DETECTED NOT DETECTED Final    Comment: Performed at [facility], [address] [place], [place] [address]  Urine culture     Status: Abnormal   Collection Time: 10/16/17  4:11 AM  Result Value Ref Range Status   Specimen Description   Final    URINE, RANDOM Performed at [facility], [address] [place], [place] [address]    Special Requests   Final    NONE Performed at [facility], [address] [place], [place] [address]    Culture >=100,000 COLONIES/mL ESCHERICHIA COLI (A)  Final   Report Status 10/18/2017 FINAL  Final   Organism ID, Bacteria ESCHERICHIA COLI (A)  Final      Susceptibility   Escherichia coli - MIC*    AMPICILLIN 8 SENSITIVE Sensitive     CEFAZOLIN <=4 SENSITIVE Sensitive     CEFTRIAXONE <=1 SENSITIVE Sensitive     CIPROFLOXACIN <=0.25 SENSITIVE Sensitive     GENTAMICIN <=1 SENSITIVE Sensitive     IMIPENEM <=0.25 SENSITIVE Sensitive     NITROFURANTOIN <=16 SENSITIVE Sensitive     TRIMETH/SULFA <=20 SENSITIVE Sensitive     AMPICILLIN/SULBACTAM 4 SENSITIVE Sensitive     PIP/TAZO <=4 SENSITIVE Sensitive     Extended ESBL NEGATIVE Sensitive     * >=100,000 COLONIES/mL ESCHERICHIA COLI  CULTURE, BLOOD (ROUTINE X 2) w Reflex to ID Panel     Status: None (Preliminary result)   Collection Time: 10/16/17  4:12 PM  Result Value Ref Range Status   Specimen Description BLOOD BLOOD LEFT HAND  Final   Special Requests   Final    BOTTLES DRAWN AEROBIC AND ANAEROBIC Blood Culture results may not be optimal  due to an excessive volume of blood received in culture bottles   Culture   Final    NO GROWTH 3 DAYS Performed at [facility], [address] [place], [place] [address]    Report Status PENDING  Incomplete  CULTURE, BLOOD (ROUTINE X 2) w Reflex to ID Panel     Status: None (Preliminary result)   Collection Time: 10/16/17  4:43 PM  Result Value Ref Range Status   Specimen Description BLOOD LEFT HAND  Final   Special Requests   Final    BOTTLES DRAWN AEROBIC AND ANAEROBIC Blood Culture results may not be optimal due to an inadequate volume of blood received in culture bottles   Culture   Final    NO GROWTH 3 DAYS Performed at [facility], [address] [place], [place] [address]    Report Status PENDING  Incomplete  MRSA PCR Screening     Status: None   Collection Time: 10/16/17  5:01 PM  Result Value Ref Range Status   MRSA by PCR NEGATIVE NEGATIVE Final    Comment:        The GeneXpert MRSA Assay (FDA approved for NASAL specimens only), is one component of a comprehensive MRSA colonization surveillance program. It is not intended to diagnose MRSA infection nor to guide or monitor treatment for MRSA infections. Performed at [facility], [address] [place], [place] [address]      Coagulation Studies: No results for input(s): LABPROT, INR in the last 72 hours.  Urinalysis: No results for input(s): COLORURINE, LABSPEC, PHURINE, GLUCOSEU, HGBUR, BILIRUBINUR, KETONESUR, PROTEINUR, UROBILINOGEN, NITRITE, LEUKOCYTESUR in the last 72 hours.  Invalid input(s): APPERANCEUR      Imaging: US Abdomen Limited  Result Date: 10/18/2017 CLINICAL DATA:  Abdominal distention. EXAM: LIMITED ABDOMEN ULTRASOUND FOR ASCITES TECHNIQUE: Limited ultrasound survey for ascites was performed in all four abdominal quadrants. COMPARISON:  10/17/2017. FINDINGS: No significant ascites noted. IMPRESSION: No significant ascites noted. Electronically Signed   By:  Marcello Moores  Register   On: 10/18/2017 17:11   Dg Chest Port 1 View  Result Date: 10/18/2017 CLINICAL DATA:  Respiratory failure. EXAM: PORTABLE CHEST 1 VIEW COMPARISON:  10/17/2017 FINDINGS: Cardiac pacemaker. Interval removal of endotracheal and enteric tubes. Right central venous catheter tip overlies the mid SVC region. Shallow inspiration. Cardiac enlargement. Since the previous study, there is increasing perihilar infiltration possibly representing edema or pneumonia. No pneumothorax. IMPRESSION: Cardiac enlargement with increasing perihilar infiltration. Electronically Signed   By: Lucienne Capers M.D.   On: 10/18/2017 04:55   US Abdomen Limited Ruq  Result Date: 10/17/2017 CLINICAL DATA:  Elevated LFTs EXAM: ULTRASOUND ABDOMEN LIMITED RIGHT UPPER QUADRANT COMPARISON:  10/15/2017 FINDINGS: Gallbladder: Gallbladder sludge is again identified. No definitive cholelithiasis is seen. No wall thickening or pericholecystic fluid is noted. Common bile duct: Diameter: 3.5 mm. Liver: No focal lesion identified. Within normal limits in parenchymal echogenicity. Portal vein is patent on color Doppler imaging with normal direction of blood flow towards the liver. IMPRESSION: Gallbladder sludge without acute abnormality. Electronically Signed   By: Inez Catalina M.D.   On: 10/17/2017 12:24      Assessment & Plan: Pt is a 67 y.o. Caucasian  male withmedical problems of severe cardiomyopathy with LVEF less than 10%  followed at [facility], atrial fibrillation, AICD diabetes, hypertension, history of stroke, CKD, who was admitted to [facility] on 10/15/2017 for evaluation of maculopapular rash over lower extremities and AICD firing.  Hospital course complicated by wide-complex tachycardia, severe respiratory distress, transferred to ICU, ventilator support.  Patient self extubated.  1.  Acute renal failure Likely ATN from cardiorenal syndrome, hypoperfusion Baseline creatinine 1.32 at admission/GFR 55 Now that  hemodynamics have stabilized, hopefully serum creatinine will start to improve  2.  Urinary tract infection from E. coli Currently being treated with IV ceftriaxone  3.  Chronic systolic congestive heart failure with AICD Management as per cardiology and internal medicine team Patient's oral intake is adequate therefore we will discontinue IV fluids       LOS: 3 Kierra Jezewski Candiss Norse 7/27/201910:03 AM  [place], [place]  Note: This note was prepared with Dragon dictation. Any transcription errors are unintentional

## 2017-10-20 DIAGNOSIS — B179 Acute viral hepatitis, unspecified: Secondary | ICD-10-CM

## 2017-10-20 LAB — COMPREHENSIVE METABOLIC PANEL
ALBUMIN: 3 g/dL — AB (ref 3.5–5.0)
ALK PHOS: 61 U/L (ref 38–126)
ALT: 915 U/L — ABNORMAL HIGH (ref 0–44)
ANION GAP: 12 (ref 5–15)
AST: 973 U/L — ABNORMAL HIGH (ref 15–41)
BILIRUBIN TOTAL: 3.7 mg/dL — AB (ref 0.3–1.2)
BUN: 91 mg/dL — AB (ref 8–23)
CO2: 16 mmol/L — ABNORMAL LOW (ref 22–32)
Calcium: 7.5 mg/dL — ABNORMAL LOW (ref 8.9–10.3)
Chloride: 105 mmol/L (ref 98–111)
Creatinine, Ser: 3.02 mg/dL — ABNORMAL HIGH (ref 0.61–1.24)
GFR calc Af Amer: 24 mL/min — ABNORMAL LOW (ref >60)
GFR, EST NON AFRICAN AMERICAN: 20 mL/min — AB (ref >60)
GLUCOSE: 233 mg/dL — AB (ref 70–99)
POTASSIUM: 3.9 mmol/L (ref 3.5–5.1)
Sodium: 133 mmol/L — ABNORMAL LOW (ref 135–145)
TOTAL PROTEIN: 5.4 g/dL — AB (ref 6.5–8.1)

## 2017-10-20 LAB — CBC WITH DIFFERENTIAL/PLATELET
BASOS ABS: 0 10*3/uL (ref 0–0.1)
BASOS PCT: 0 %
EOS ABS: 0 10*3/uL (ref 0–0.7)
EOS PCT: 0 %
HEMATOCRIT: 31.8 % — AB (ref 40.0–52.0)
HEMOGLOBIN: 10.8 g/dL — AB (ref 13.0–18.0)
Lymphocytes Relative: 5 %
Lymphs Abs: 0.3 10*3/uL — ABNORMAL LOW (ref 1.0–3.6)
MCH: 28.7 pg (ref 26.0–34.0)
MCHC: 34 g/dL (ref 32.0–36.0)
MCV: 84.4 fL (ref 80.0–100.0)
MONO ABS: 0.6 10*3/uL (ref 0.2–1.0)
Monocytes Relative: 8 %
Neutro Abs: 6.5 10*3/uL (ref 1.4–6.5)
Neutrophils Relative %: 87 %
Platelets: 74 10*3/uL — ABNORMAL LOW (ref 150–440)
RBC: 3.77 MIL/uL — ABNORMAL LOW (ref 4.40–5.90)
RDW: 16.8 % — AB (ref 11.5–14.5)
WBC: 7.4 10*3/uL (ref 3.8–10.6)

## 2017-10-20 LAB — PROCALCITONIN: PROCALCITONIN: 21.38 ng/mL

## 2017-10-20 LAB — GLUCOSE, CAPILLARY
GLUCOSE-CAPILLARY: 297 mg/dL — AB (ref 70–99)
Glucose-Capillary: 212 mg/dL — ABNORMAL HIGH (ref 70–99)
Glucose-Capillary: 278 mg/dL — ABNORMAL HIGH (ref 70–99)

## 2017-10-20 LAB — CULTURE, BLOOD (ROUTINE X 2)
Culture: NO GROWTH
Special Requests: ADEQUATE

## 2017-10-20 LAB — LACTIC ACID, PLASMA: LACTIC ACID, VENOUS: 1.6 mmol/L (ref 0.5–1.9)

## 2017-10-20 MED ORDER — INSULIN ASPART 100 UNIT/ML ~~LOC~~ SOLN
3.0000 [IU] | Freq: Three times a day (TID) | SUBCUTANEOUS | Status: DC
  Administered 2017-10-20 – 2017-10-21 (×3): 3 [IU] via SUBCUTANEOUS
  Filled 2017-10-20 (×3): qty 1

## 2017-10-20 MED ORDER — INSULIN ASPART 100 UNIT/ML ~~LOC~~ SOLN
0.0000 [IU] | Freq: Three times a day (TID) | SUBCUTANEOUS | Status: DC
  Administered 2017-10-20: 8 [IU] via SUBCUTANEOUS
  Administered 2017-10-21: 3 [IU] via SUBCUTANEOUS
  Administered 2017-10-21: 5 [IU] via SUBCUTANEOUS
  Administered 2017-10-21: 11 [IU] via SUBCUTANEOUS
  Administered 2017-10-22 (×2): 3 [IU] via SUBCUTANEOUS
  Administered 2017-10-23: 5 [IU] via SUBCUTANEOUS
  Administered 2017-10-23: 3 [IU] via SUBCUTANEOUS
  Administered 2017-10-23 – 2017-10-24 (×3): 2 [IU] via SUBCUTANEOUS
  Filled 2017-10-20 (×11): qty 1

## 2017-10-20 MED ORDER — METHYLPREDNISOLONE SODIUM SUCC 40 MG IJ SOLR
40.0000 mg | Freq: Every day | INTRAMUSCULAR | Status: DC
  Administered 2017-10-20: 40 mg via INTRAVENOUS
  Filled 2017-10-20: qty 1

## 2017-10-20 MED ORDER — CIPROFLOXACIN HCL 500 MG PO TABS
500.0000 mg | ORAL_TABLET | Freq: Two times a day (BID) | ORAL | Status: DC
  Administered 2017-10-20 – 2017-10-21 (×2): 500 mg via ORAL
  Filled 2017-10-20 (×3): qty 1

## 2017-10-20 MED ORDER — METHYLPREDNISOLONE SODIUM SUCC 40 MG IJ SOLR: 40.0000 mg | Freq: Two times a day (BID) | INTRAMUSCULAR | Status: DC

## 2017-10-20 MED ORDER — INSULIN ASPART 100 UNIT/ML ~~LOC~~ SOLN: 3.0000 [IU] | Freq: Three times a day (TID) | SUBCUTANEOUS | Status: DC

## 2017-10-20 MED ORDER — INSULIN ASPART 100 UNIT/ML ~~LOC~~ SOLN: 0.0000 [IU] | Freq: Three times a day (TID) | SUBCUTANEOUS | Status: DC

## 2017-10-20 NOTE — Progress Notes (Signed)
Derek Darby, MD [address]  [place]  [place], [place] [address]  Main: [phone]  Fax: [phone] Pager: [phone]   Subjective: He feels significantly better today. His LFTs are improving. He is tolerating by mouth diet well. He denies any diarrhea or blood in stools. He denies abdominal pain, nausea or vomiting.   Objective: Vital signs in last 24 hours: Vitals:   10/19/17 2031 10/20/17 0418 10/20/17 0821 10/20/17 0938  BP: 124/73 111/74 111/66   Pulse: 76 60 63 79  Resp: '16 18 18   '$ Temp: (!) 97.4 F (36.3 C) 98 F (36.7 C) 98.2 F (36.8 C)   TempSrc: Oral Oral    SpO2: 100% 100% 100%   Weight:      Height:       Weight change:   Intake/Output Summary (Last 24 hours) at 10/20/2017 1354 Last data filed at 10/20/2017 0418 Gross per 24 hour  Intake -  Output 1150 ml  Net -1150 ml     Exam: Heart:: Regular rate and rhythm or S1S2 present Lungs: clear to auscultation Abdomen: soft, nontender, normal bowel sounds   Lab Results: CBC Latest Ref Rng & Units 10/20/2017 10/19/2017 10/18/2017  WBC 3.8 - 10.6 K/uL 7.4 5.5 4.0  Hemoglobin 13.0 - 18.0 g/dL 10.8(L) 10.8(L) 10.0(L)  Hematocrit 40.0 - 52.0 % 31.8(L) 32.2(L) 29.2(L)  Platelets 150 - 440 K/uL 74(L) 54(L) COUNT MAY BE INACCURATE DUE TO FIBRIN CLUMPS.   Hepatic Function Latest Ref Rng & Units 10/20/2017 10/19/2017 10/18/2017  Total Protein 6.5 - 8.1 g/dL 5.4(L) 5.3(L) 5.7(L)  Albumin 3.5 - 5.0 g/dL 3.0(L) 2.9(L) 3.0(L)  AST 15 - 41 U/L 973(H) 2,498(H) 1,589(H)  ALT 0 - 44 U/L 915(H) 1,183(H) 805(H)  Alk Phosphatase 38 - 126 U/L 61 58 52  Total Bilirubin 0.3 - 1.2 mg/dL 3.7(H) 3.8(H) 3.2(H)  Bilirubin, Direct 0.0 - 0.2 mg/dL - 2.0(H) -   BMP Latest Ref Rng & Units 10/20/2017 10/19/2017 10/18/2017  Glucose 70 - 99 mg/dL 233(H) 202(H) 164(H)  BUN 8 - 23 mg/dL 91(H) 71(H) 62(H)  Creatinine 0.61 - 1.24 mg/dL 3.02(H) 3.22(H) 3.01(H)  Sodium 135 - 145 mmol/L 133(L) 135 133(L)  Potassium 3.5 - 5.1  mmol/L 3.9 4.1 4.2  Chloride 98 - 111 mmol/L 105 107 104  CO2 22 - 32 mmol/L 16(L) 14(L) 12(L)  Calcium 8.9 - 10.3 mg/dL 7.5(L) 7.5(L) 7.6(L)   Micro Results: Recent Results (from the past 240 hour(s))  Blood Culture (routine x 2)     Status: Abnormal   Collection Time: 10/15/17  8:10 PM  Result Value Ref Range Status   Specimen Description   Final    BLOOD LEFT ANTECUBITAL Performed at [facility], [address], [place], [place] [address]    Special Requests   Final    BOTTLES DRAWN AEROBIC AND ANAEROBIC Blood Culture adequate volume Performed at [facility], [address] [place], [place] [address]    Culture  Setup Time   Final    AEROBIC BOTTLE ONLY GRAM POSITIVE COCCI CRITICAL RESULT CALLED TO, READ BACK BY AND VERIFIED WITH: SHEEMA HALLAJI 10/16/17 1437 KLW    Culture (A)  Final    STAPHYLOCOCCUS SPECIES (COAGULASE NEGATIVE) THE SIGNIFICANCE OF ISOLATING THIS ORGANISM FROM A SINGLE SET OF BLOOD CULTURES WHEN MULTIPLE SETS ARE DRAWN IS UNCERTAIN. PLEASE NOTIFY THE MICROBIOLOGY DEPARTMENT WITHIN ONE WEEK IF SPECIATION AND SENSITIVITIES ARE REQUIRED. Performed at [facility], [place] [address]  [place], [place] [address]    Report Status 10/18/2017 FINAL  Final  Blood Culture (routine x 2)     Status: None   Collection Time: 10/15/17  8:10 PM  Result Value Ref Range Status   Specimen Description BLOOD BLOOD RIGHT WRIST  Final   Special Requests   Final    BOTTLES DRAWN AEROBIC AND ANAEROBIC Blood Culture adequate volume   Culture   Final    NO GROWTH 5 DAYS Performed at [facility], [address] [place], [place] [address]    Report Status 10/20/2017 FINAL  Final  Blood Culture ID Panel (Reflexed)     Status: Abnormal   Collection Time: 10/15/17  8:10 PM  Result Value Ref Range Status   Enterococcus species NOT DETECTED NOT DETECTED Final   Listeria monocytogenes NOT DETECTED NOT DETECTED Final   Staphylococcus species DETECTED  (A) NOT DETECTED Final    Comment: Methicillin (oxacillin) susceptible coagulase negative staphylococcus. Possible blood culture contaminant (unless isolated from more than one blood culture draw or clinical case suggests pathogenicity). No antibiotic treatment is indicated for blood  culture contaminants. CRITICAL RESULT CALLED TO, READ BACK BY AND VERIFIED WITH: SHEEMA HALLAJI 10/16/17 1437 KLW    Staphylococcus aureus NOT DETECTED NOT DETECTED Final   Methicillin resistance NOT DETECTED NOT DETECTED Final   Streptococcus species NOT DETECTED NOT DETECTED Final   Streptococcus agalactiae NOT DETECTED NOT DETECTED Final   Streptococcus pneumoniae NOT DETECTED NOT DETECTED Final   Streptococcus pyogenes NOT DETECTED NOT DETECTED Final   Acinetobacter baumannii NOT DETECTED NOT DETECTED Final   Enterobacteriaceae species NOT DETECTED NOT DETECTED Final   Enterobacter cloacae complex NOT DETECTED NOT DETECTED Final   Escherichia coli NOT DETECTED NOT DETECTED Final   Klebsiella oxytoca NOT DETECTED NOT DETECTED Final   Klebsiella pneumoniae NOT DETECTED NOT DETECTED Final   Proteus species NOT DETECTED NOT DETECTED Final   Serratia marcescens NOT DETECTED NOT DETECTED Final   Haemophilus influenzae NOT DETECTED NOT DETECTED Final   Neisseria meningitidis NOT DETECTED NOT DETECTED Final   Pseudomonas aeruginosa NOT DETECTED NOT DETECTED Final   Candida albicans NOT DETECTED NOT DETECTED Final   Candida glabrata NOT DETECTED NOT DETECTED Final   Candida krusei NOT DETECTED NOT DETECTED Final   Candida parapsilosis NOT DETECTED NOT DETECTED Final   Candida tropicalis NOT DETECTED NOT DETECTED Final    Comment: Performed at [facility], [address] [place], [place] [address]  Urine culture     Status: Abnormal   Collection Time: 10/16/17  4:11 AM  Result Value Ref Range Status   Specimen Description   Final    URINE, RANDOM Performed at [facility], [address] [place], [place] [address]    Special Requests   Final    NONE Performed at [facility], [address] [place], [place] [address]    Culture >=100,000 COLONIES/mL ESCHERICHIA COLI (A)  Final   Report Status 10/18/2017 FINAL  Final   Organism ID, Bacteria ESCHERICHIA COLI (A)  Final      Susceptibility   Escherichia coli - MIC*    AMPICILLIN 8 SENSITIVE Sensitive     CEFAZOLIN <=4 SENSITIVE Sensitive     CEFTRIAXONE <=1 SENSITIVE Sensitive     CIPROFLOXACIN <=0.25 SENSITIVE Sensitive     GENTAMICIN <=1 SENSITIVE Sensitive     IMIPENEM <=0.25 SENSITIVE Sensitive     NITROFURANTOIN <=16 SENSITIVE Sensitive     TRIMETH/SULFA <=20 SENSITIVE Sensitive  AMPICILLIN/SULBACTAM 4 SENSITIVE Sensitive     PIP/TAZO <=4 SENSITIVE Sensitive     Extended ESBL NEGATIVE Sensitive     * >=100,000 COLONIES/mL ESCHERICHIA COLI  CULTURE, BLOOD (ROUTINE X 2) w Reflex to ID Panel     Status: None (Preliminary result)   Collection Time: 10/16/17  4:12 PM  Result Value Ref Range Status   Specimen Description BLOOD BLOOD LEFT HAND  Final   Special Requests   Final    BOTTLES DRAWN AEROBIC AND ANAEROBIC Blood Culture results may not be optimal due to an excessive volume of blood received in culture bottles   Culture   Final    NO GROWTH 4 DAYS Performed at [facility], [address] [place], [place] [address]    Report Status PENDING  Incomplete  CULTURE, BLOOD (ROUTINE X 2) w Reflex to ID Panel     Status: None (Preliminary result)   Collection Time: 10/16/17  4:43 PM  Result Value Ref Range Status   Specimen Description BLOOD LEFT HAND  Final   Special Requests   Final    BOTTLES DRAWN AEROBIC AND ANAEROBIC Blood Culture results may not be optimal due to an inadequate volume of blood received in culture bottles   Culture   Final    NO GROWTH 4 DAYS Performed at [facility], [address], [place], [place] [address]    Report Status PENDING  Incomplete    MRSA PCR Screening     Status: None   Collection Time: 10/16/17  5:01 PM  Result Value Ref Range Status   MRSA by PCR NEGATIVE NEGATIVE Final    Comment:        The GeneXpert MRSA Assay (FDA approved for NASAL specimens only), is one component of a comprehensive MRSA colonization surveillance program. It is not intended to diagnose MRSA infection nor to guide or monitor treatment for MRSA infections. Performed at [facility], [address] [place], [place] [address]    Studies/Results: US Abdomen Limited  Result Date: 10/18/2017 CLINICAL DATA:  Abdominal distention. EXAM: LIMITED ABDOMEN ULTRASOUND FOR ASCITES TECHNIQUE: Limited ultrasound survey for ascites was performed in all four abdominal quadrants. COMPARISON:  10/17/2017. FINDINGS: No significant ascites noted. IMPRESSION: No significant ascites noted. Electronically Signed   By: Marcello Moores  Register   On: 10/18/2017 17:11   Medications: I have reviewed the patient's current medications. Scheduled Meds: . amiodarone  200 mg Oral Daily  . aspirin  81 mg Oral Daily  . chlorhexidine gluconate (MEDLINE KIT)  15 mL Mouth Rinse BID  . digoxin  125 mcg Oral QODAY  . famotidine  20 mg Oral Daily  . methylPREDNISolone (SOLU-MEDROL) injection  40 mg Intravenous Q12H  . metoprolol tartrate  25 mg Oral BID  . nitroGLYCERIN  0.3 mg Transdermal Daily   Continuous Infusions: . cefTRIAXone (ROCEPHIN)  IV Stopped (10/20/17 1022)   PRN Meds:.albuterol, ALPRAZolam, bisacodyl, docusate, ipratropium-albuterol, metoprolol tartrate, nitroGLYCERIN, [DISCONTINUED] ondansetron **OR** ondansetron (ZOFRAN) IV, senna-docusate, traZODone   Assessment: Active Problems:   FUO (fever of unknown origin)   Congestive dilated cardiomyopathy ([place])   Goals of care, counseling/discussion   Palliative care by specialist   Elevated liver enzymes  History of ulcerative left-sided colitis, diagnosed in 2009, in clinical remission on  balsalazide His LFTs are improving  Plan:  Elevated LFTs: likely due to ischemic liver injury with history of severe nonischemic cardiomyopathy and septic shock due to pneumonia Viral hepatitis panel, HIV negative CMV, HSV, EBV IgM  titers are pending  Left-sided UC: Resume his home medication balsalazide at 3pills daily Follow-up with Dr. Domenica Fail at [facility] upon discharge  Dr. Vicente Males to cover from tomorrow   LOS: 4 days   Irene 10/20/2017, 1:54 PM

## 2017-10-20 NOTE — Progress Notes (Signed)
Pt CBG 297, MD notified orders for sliding scale insulin added along with 3 units w/ each meal. Foley catheter removed this afternoon, there is no indication for this pt.

## 2017-10-20 NOTE — Plan of Care (Signed)
  Problem: Clinical Measurements: Goal: Ability to maintain clinical measurements within normal limits will improve Outcome: Progressing Goal: Respiratory complications will improve Outcome: Progressing   Problem: Activity: Goal: Risk for activity intolerance will decrease Outcome: Progressing   Problem: Nutrition: Goal: Adequate nutrition will be maintained Outcome: Progressing   

## 2017-10-20 NOTE — Progress Notes (Addendum)
[place] [facility] [place] NAME: Derek Barnes    MR#:  [id number]  DATE OF BIRTH:  [birth year]  SUBJECTIVE: Admitted to telemetry initially but had respiratory distress, wide-complex tachycardia yesterday so transferred to intensive care unit.  Patient respiratory status better, denies chest pain.  On amiodarone drip, phenylephrine for hypotension.  Discontinued amiodarone drip when patient got transferred to telemetry bed on 10/17/2017.  No further fever..  CHIEF COMPLAINT:   Chief Complaint  Patient presents with  . Shortness of Breath  . Nausea  Patient has good urine output..  Denies any chest pain or abdominal pain or shortness of breath .  Wants to get discharged as soon as possible REVIEW OF SYSTEMS:    Review of Systems  Constitutional: Negative for chills and fever.  HENT: Negative for hearing loss.   Eyes: Negative for blurred vision, double vision and photophobia.  Respiratory: Negative for cough, hemoptysis and shortness of breath.   Cardiovascular: Negative for palpitations, orthopnea and leg swelling.  Gastrointestinal: Negative for abdominal pain, diarrhea and vomiting.  Genitourinary: Negative for dysuria and urgency.  Musculoskeletal: Negative for myalgias and neck pain.  Skin: Negative for rash.  Neurological: Negative for dizziness, focal weakness, seizures, weakness and headaches.  Psychiatric/Behavioral: Negative for memory loss. The patient does not have insomnia.    Nutrition:  Tolerating Diet: Tolerating PT:      DRUG ALLERGIES:   Allergies  Allergen Reactions  . Contrast Media [Iodinated Diagnostic Agents] Anaphylaxis  . Penicillins Anaphylaxis    Has patient had a PCN reaction causing immediate rash, facial/tongue/throat swelling, SOB or lightheadedness with hypotension: Yes Has patient had a PCN reaction causing severe rash involving mucus membranes or skin necrosis: Yes Has patient had a PCN reaction  that required hospitalization: No Has patient had a PCN reaction occurring within the last 10 years: No If all of the above answers are "NO", then may proceed with Cephalosporin use.   . Sotalol Other (See Comments)    Excessive fatigue    VITALS:  Blood pressure 111/66, pulse 79, temperature 98.2 F (36.8 C), resp. rate 18, height _0  (1.727 m), weight 90.4 kg (199 lb 3.2 oz), SpO2 100 %.  PHYSICAL EXAMINATION:   Physical Exam  GENERAL:  67 y.o.-year-old patient lying in the bed with obviousrespiratory distress. EYES: Pupils equal, round, reactive to light and accommodation. No scleral icterus.  HEENT: Head atraumatic, normocephalic. Oropharynx and nasopharynx clear.  NECK:  Supple, no jugular venous distention. No thyroid enlargement, no tenderness.  LUNGS: Bibasilar crepitations.  Marland Kitchen  CARDIOVASCULAR:  S1, S2  Irregular. .  ABDOMEN: Soft, nontender, nondistended. Bowel sounds present. No organomegaly or mass.  EXTREMITIES:trace pedal edema..  Cyanosis, or clubbing.  NEUROLOGIC, awake, oriented.  Cranial nerves II through XII intact, 5/5 upper and lowerextremities/.  Sensations are intact.  DTRs 2+ bilaterally.   PSYCHIATRIC: The patient is alert and oriented x 3.  During my visit SKIN: No obvious rash, lesion, or ulcer.    LABORATORY PANEL:   CBC Recent Labs  Lab 10/20/17 0027  WBC 7.4  HGB 10.8*  HCT 31.8*  PLT 74*   ------------------------------------------------------------------------------------------------------------------  Chemistries  Recent Labs  Lab 10/20/17 0027  NA 133*  K 3.9  CL 105  CO2 16*  GLUCOSE 233*  BUN 91*  CREATININE 3.02*  CALCIUM 7.5*  AST 973*  ALT 915*  ALKPHOS 61  BILITOT 3.7*   ------------------------------------------------------------------------------------------------------------------  Cardiac Enzymes Recent Labs  Lab  10/17/17 0409  TROPONINI 1.58*    ------------------------------------------------------------------------------------------------------------------  RADIOLOGY:  US Abdomen Limited  Result Date: 10/18/2017 CLINICAL DATA:  Abdominal distention. EXAM: LIMITED ABDOMEN ULTRASOUND FOR ASCITES TECHNIQUE: Limited ultrasound survey for ascites was performed in all four abdominal quadrants. COMPARISON:  10/17/2017. FINDINGS: No significant ascites noted. IMPRESSION: No significant ascites noted. Electronically Signed   By: Marcello Moores  Register   On: 10/18/2017 17:11     ASSESSMENT AND PLAN:   Active Problems:   FUO (fever of unknown origin)   Congestive dilated cardiomyopathy ([facility])   Goals of care, counseling/discussion   Palliative care by specialist   Elevated liver enzymes  67 year old male patient with history of atrial fibrillation, V. tach, status post AICD placement and follows with [facility] cardiology, patient also has diabetes mellitus type 2, CVA admitted for maculopapular rash and fever and AICD firing.  Patient had respiratory distress with tachycardia yesterday, transferred to ICU.     #Shock liver secondary to congestive hepatopathy:  Continue monitoring LFTs- Clinically feeling better  abdominal ultrasound no ascites  GI  Following. Hepatitis panel negative  checking for HSV, Epstein-Barr virus and CMV  # .  Acute respiratory distress secondary to acute pulmonary edema: Received 40 mg of IV Lasix one-time, transferred to stepdown unit for BiPAP support, patient has history of severe dilated cardiopathy, history of V. tach, patient received amiodarone loading/infusion, c and by Dr. Clayborn Bigness from cardiology she needs AICD interrogation.  Patient clinically improved and discontinued amiodarone drip and changed to p.o. Amiodarone.  Patient got transferred to floor today 10/17/2017  #Thrombocytopenia could be from sepsis or heparin-induced Platelet count is slightly better today 54,000-74,000 HIT panel -  pending Discontinued heparin No active bleeding or bruising  #.  Atrial fibrillation: Patient was on amiodarone drip, heparin drip, both are discontinued currently rate controlled.  Amiodarone changed to p.o.  # acute renal failure patient has CKD stage III; slightly worse today. BUN 62-71 Creatinine 3.01-3.22  --3.0.  Okay to discharge patient from nephrology standpoint if the renal function is improving.  Baseline creatinine at 1.3   monitor closely..  status post gentle hydration with IV fluids.    #Sepsis met criteria with fever and tachycardia secondary to pneumonia and UTI  fever resolved  UTI, patient also had maculopapular rash s by Dr. Graylon Good from ID, recommended  Rocephin,  Blood cultures 1 out of 4 sets positive for staph species.  Will change antibiotics to p.o. Cipro which covers both pneumonia and UTI urineCulture showing more than 100,000 colonies of E. coli.  Blood cultures are no growth till date.  gentle hydration with IV fluids provided and recheck lactic acid levels and procalcitonin Steroids and bronchodilators are added to the regimen for bronchospasm from underlying pneumonia  #Metabolic acidosis from sepsis, compensated with respiratory alkalosis gentle hydration with IV fluids provided and recheck lactic acid levels and procalcitonin   Acute on chronic systolic heart failure, BNP 705, cardiogram this time showed EF 10 to 15%.  status post AICD.  Continue, heparin drip because of V. tach episodes, Continue Entresto.  Patient on digoxin.    Patient is at high risk for cardiac arrest with high mortality rate Consult palliative care.  Anticipate discharge in 1 to 2 days    .  all the records are reviewed and case discussed with Care Management/Social Workerr. Management plans discussed with the patient, family and they are in agreement.  CODE STATUS:full  TOTAL TIME TAKING CARE OF THIS PATIENT: 39  minutes.  Patient is too unstable for discharge planning.   Patient high risk for cardiac arrest in critically ill at this time,. Discussed with patient's  Daughter like person  patient has no family, daughter is in the process of getting healthcare power of attorney papers, she needs help with chaplain.  Nicholes Mango M.D on 10/20/2017 at 3:21 PM  Between 7am to 6pm - Pager - [phone]   After 6pm go to [email or web address] - password [email or web address] [facility]  Office  [phone]  CC: Primary care physician; Phineas Inches, MD

## 2017-10-20 NOTE — Progress Notes (Signed)
[facility], [place] 10/20/17  Subjective:   Patient is doing better Serum creatinine slightly lower to 3.02 No leg edema LFTs are improving  Objective:  Vital signs in last 24 hours:  Temp:  [97.4 F (36.3 C)-98.2 F (36.8 C)] 98.2 F (36.8 C) (07/28 0821) Pulse Rate:  [60-79] 79 (07/28 0938) Resp:  [16-18] 18 (07/28 0821) BP: (104-124)/(66-74) 111/66 (07/28 0821) SpO2:  [99 %-100 %] 100 % (07/28 0821)  Weight change:  Filed Weights   10/16/17 1017 10/16/17 1155 10/19/17 0357  Weight: 86.3 kg (190 lb 4.8 oz) 86.3 kg (190 lb 4.8 oz) 90.4 kg (199 lb 3.2 oz)    Intake/Output:    Intake/Output Summary (Last 24 hours) at 10/20/2017 1223 Last data filed at 10/20/2017 0418 Gross per 24 hour  Intake -  Output 1150 ml  Net -1150 ml     Physical Exam: General:  No acute distress, sitting up on the side of bed  HEENT  anicteric, moist oral mucous membranes  Neck:  Supple  Lungs:  Clear to auscultation bilaterally  Heart::  Irregular rhythm, no rub  Abdomen:  Soft,  Extremities:  No peripheral edema  Neurologic:  Alert, oriented  Skin:  Maculopapular rash, healing, bilateral legs up to upper thighs  Foley:  Present    Basic Metabolic Panel:  Recent Labs  Lab 10/17/17 0409 10/18/17 0847 10/18/17 1914 10/19/17 0627 10/20/17 0027  NA 140 135 133* 135 133*  K 4.7 3.6 4.2 4.1 3.9  CL 109 107 104 107 105  CO2 20* 17* 12* 14* 16*  GLUCOSE 122* 144* 164* 202* 233*  BUN 36* 52* 62* 71* 91*  CREATININE 2.20* 2.55* 3.01* 3.22* 3.02*  CALCIUM 7.7* 7.5* 7.6* 7.5* 7.5*     CBC: Recent Labs  Lab 10/15/17 2010 10/16/17 0754 10/17/17 0409 10/18/17 0847 10/19/17 0627 10/20/17 0027  WBC 8.0 4.3 7.5 4.0 5.5 7.4  NEUTROABS 6.6*  --   --   --  4.6 6.5  HGB 11.2* 10.8* 11.2* 10.0* 10.8* 10.8*  HCT 33.2* 32.0* 32.9* 29.2* 32.2* 31.8*  MCV 86.6 86.4 87.2 86.3 86.5 84.4  PLT 113* 92* 101* COUNT MAY BE INACCURATE DUE TO FIBRIN CLUMPS. 54* 74*       Lab Results  Component Value Date   HEPBSAG Negative 10/17/2017   HEPBIGM Negative 10/17/2017      Microbiology:  Recent Results (from the past 240 hour(s))  Blood Culture (routine x 2)     Status: Abnormal   Collection Time: 10/15/17  8:10 PM  Result Value Ref Range Status   Specimen Description   Final    BLOOD LEFT ANTECUBITAL Performed at [facility], [address] [place], [place] [address]    Special Requests   Final    BOTTLES DRAWN AEROBIC AND ANAEROBIC Blood Culture adequate volume Performed at [facility], [address] [place], [place] [address]    Culture  Setup Time   Final    AEROBIC BOTTLE ONLY GRAM POSITIVE COCCI CRITICAL RESULT CALLED TO, READ BACK BY AND VERIFIED WITH: SHEEMA HALLAJI 10/16/17 1437 KLW    Culture (A)  Final    STAPHYLOCOCCUS SPECIES (COAGULASE NEGATIVE) THE SIGNIFICANCE OF ISOLATING THIS ORGANISM FROM A SINGLE SET OF BLOOD CULTURES WHEN MULTIPLE SETS ARE DRAWN IS UNCERTAIN. PLEASE NOTIFY THE MICROBIOLOGY DEPARTMENT WITHIN ONE WEEK IF SPECIATION AND SENSITIVITIES ARE REQUIRED. Performed at [facility], [place] [address], [place], [place] [address]  Report Status 10/18/2017 FINAL  Final  Blood Culture (routine x 2)     Status: None   Collection Time: 10/15/17  8:10 PM  Result Value Ref Range Status   Specimen Description BLOOD BLOOD RIGHT WRIST  Final   Special Requests   Final    BOTTLES DRAWN AEROBIC AND ANAEROBIC Blood Culture adequate volume   Culture   Final    NO GROWTH 5 DAYS Performed at [facility], [address] [place], [place] [address]    Report Status 10/20/2017 FINAL  Final  Blood Culture ID Panel (Reflexed)     Status: Abnormal   Collection Time: 10/15/17  8:10 PM  Result Value Ref Range Status   Enterococcus species NOT DETECTED NOT DETECTED Final   Listeria monocytogenes NOT DETECTED NOT DETECTED Final   Staphylococcus species DETECTED (A) NOT DETECTED Final     Comment: Methicillin (oxacillin) susceptible coagulase negative staphylococcus. Possible blood culture contaminant (unless isolated from more than one blood culture draw or clinical case suggests pathogenicity). No antibiotic treatment is indicated for blood  culture contaminants. CRITICAL RESULT CALLED TO, READ BACK BY AND VERIFIED WITH: SHEEMA HALLAJI 10/16/17 1437 KLW    Staphylococcus aureus NOT DETECTED NOT DETECTED Final   Methicillin resistance NOT DETECTED NOT DETECTED Final   Streptococcus species NOT DETECTED NOT DETECTED Final   Streptococcus agalactiae NOT DETECTED NOT DETECTED Final   Streptococcus pneumoniae NOT DETECTED NOT DETECTED Final   Streptococcus pyogenes NOT DETECTED NOT DETECTED Final   Acinetobacter baumannii NOT DETECTED NOT DETECTED Final   Enterobacteriaceae species NOT DETECTED NOT DETECTED Final   Enterobacter cloacae complex NOT DETECTED NOT DETECTED Final   Escherichia coli NOT DETECTED NOT DETECTED Final   Klebsiella oxytoca NOT DETECTED NOT DETECTED Final   Klebsiella pneumoniae NOT DETECTED NOT DETECTED Final   Proteus species NOT DETECTED NOT DETECTED Final   Serratia marcescens NOT DETECTED NOT DETECTED Final   Haemophilus influenzae NOT DETECTED NOT DETECTED Final   Neisseria meningitidis NOT DETECTED NOT DETECTED Final   Pseudomonas aeruginosa NOT DETECTED NOT DETECTED Final   Candida albicans NOT DETECTED NOT DETECTED Final   Candida glabrata NOT DETECTED NOT DETECTED Final   Candida krusei NOT DETECTED NOT DETECTED Final   Candida parapsilosis NOT DETECTED NOT DETECTED Final   Candida tropicalis NOT DETECTED NOT DETECTED Final    Comment: Performed at [facility], [address], [place], [place] [address]  Urine culture     Status: Abnormal   Collection Time: 10/16/17  4:11 AM  Result Value Ref Range Status   Specimen Description   Final    URINE, RANDOM Performed at [facility], [address] [place], [place]  [address]    Special Requests   Final    NONE Performed at [facility], [address] [place], [place] [address]    Culture >=100,000 COLONIES/mL ESCHERICHIA COLI (A)  Final   Report Status 10/18/2017 FINAL  Final   Organism ID, Bacteria ESCHERICHIA COLI (A)  Final      Susceptibility   Escherichia coli - MIC*    AMPICILLIN 8 SENSITIVE Sensitive     CEFAZOLIN <=4 SENSITIVE Sensitive     CEFTRIAXONE <=1 SENSITIVE Sensitive     CIPROFLOXACIN <=0.25 SENSITIVE Sensitive     GENTAMICIN <=1 SENSITIVE Sensitive     IMIPENEM <=0.25 SENSITIVE Sensitive     NITROFURANTOIN <=16 SENSITIVE Sensitive     TRIMETH/SULFA <=20 SENSITIVE Sensitive     AMPICILLIN/SULBACTAM 4  SENSITIVE Sensitive     PIP/TAZO <=4 SENSITIVE Sensitive     Extended ESBL NEGATIVE Sensitive     * >=100,000 COLONIES/mL ESCHERICHIA COLI  CULTURE, BLOOD (ROUTINE X 2) w Reflex to ID Panel     Status: None (Preliminary result)   Collection Time: 10/16/17  4:12 PM  Result Value Ref Range Status   Specimen Description BLOOD BLOOD LEFT HAND  Final   Special Requests   Final    BOTTLES DRAWN AEROBIC AND ANAEROBIC Blood Culture results may not be optimal due to an excessive volume of blood received in culture bottles   Culture   Final    NO GROWTH 4 DAYS Performed at [facility], [address] [place], [place] [address]    Report Status PENDING  Incomplete  CULTURE, BLOOD (ROUTINE X 2) w Reflex to ID Panel     Status: None (Preliminary result)   Collection Time: 10/16/17  4:43 PM  Result Value Ref Range Status   Specimen Description BLOOD LEFT HAND  Final   Special Requests   Final    BOTTLES DRAWN AEROBIC AND ANAEROBIC Blood Culture results may not be optimal due to an inadequate volume of blood received in culture bottles   Culture   Final    NO GROWTH 4 DAYS Performed at [facility], [address], [place], [place] [address]    Report Status PENDING  Incomplete  MRSA PCR Screening      Status: None   Collection Time: 10/16/17  5:01 PM  Result Value Ref Range Status   MRSA by PCR NEGATIVE NEGATIVE Final    Comment:        The GeneXpert MRSA Assay (FDA approved for NASAL specimens only), is one component of a comprehensive MRSA colonization surveillance program. It is not intended to diagnose MRSA infection nor to guide or monitor treatment for MRSA infections. Performed at [facility], [address] [place], [place] [address]     Coagulation Studies: No results for input(s): LABPROT, INR in the last 72 hours.  Urinalysis: No results for input(s): COLORURINE, LABSPEC, PHURINE, GLUCOSEU, HGBUR, BILIRUBINUR, KETONESUR, PROTEINUR, UROBILINOGEN, NITRITE, LEUKOCYTESUR in the last 72 hours.  Invalid input(s): APPERANCEUR    Imaging: US Abdomen Limited  Result Date: 10/18/2017 CLINICAL DATA:  Abdominal distention. EXAM: LIMITED ABDOMEN ULTRASOUND FOR ASCITES TECHNIQUE: Limited ultrasound survey for ascites was performed in all four abdominal quadrants. COMPARISON:  10/17/2017. FINDINGS: No significant ascites noted. IMPRESSION: No significant ascites noted. Electronically Signed   By: Marcello Moores  Register   On: 10/18/2017 17:11     Medications:   . cefTRIAXone (ROCEPHIN)  IV Stopped (10/20/17 1022)   . amiodarone  200 mg Oral Daily  . aspirin  81 mg Oral Daily  . chlorhexidine gluconate (MEDLINE KIT)  15 mL Mouth Rinse BID  . digoxin  125 mcg Oral QODAY  . famotidine  20 mg Oral Daily  . methylPREDNISolone (SOLU-MEDROL) injection  40 mg Intravenous Q12H  . metoprolol tartrate  25 mg Oral BID  . nitroGLYCERIN  0.3 mg Transdermal Daily   albuterol, ALPRAZolam, bisacodyl, docusate, ipratropium-albuterol, metoprolol tartrate, nitroGLYCERIN, [DISCONTINUED] ondansetron **OR** ondansetron (ZOFRAN) IV, senna-docusate, traZODone  Assessment/ Plan:  67 y.o. Caucasian male with medical problems of severe cardiomyopathy with LVEF less than 10% followed at [facility], atrial fibrillation, AICD diabetes, hypertension, history of stroke, CKD, who was admitted to [facility] on 10/15/2017 for evaluation of maculopapular rash over lower extremities and AICD firing.  Hospital  course complicated by wide-complex tachycardia, severe respiratory distress, transferred to ICU, ventilator support.  Patient self extubated.  1.  Acute renal failure, chronic kidney disease stage III Likely ATN from cardiorenal syndrome, hypoperfusion Baseline creatinine 1.32 at admission/GFR 55 Now that hemodynamics have stabilized, hopefully serum creatinine will start to improve His creatinine slightly improved to 3.02  2.  Urinary tract infection from E. coli Currently being treated with IV ceftriaxone  3.  Chronic systolic congestive heart failure with AICD Management as per cardiology and internal medicine team Patient's oral intake is adequate therefore discontinue IV fluids      LOS: 4 Derek Barnes 7/28/201912:23 PM  [place], [place]  Note: This note was prepared with Dragon dictation. Any transcription errors are unintentional

## 2017-10-21 ENCOUNTER — Encounter: Payer: Self-pay | Admitting: *Deleted

## 2017-10-21 LAB — COMPREHENSIVE METABOLIC PANEL WITH GFR
ALT: 627 U/L — ABNORMAL HIGH (ref 0–44)
AST: 273 U/L — ABNORMAL HIGH (ref 15–41)
Albumin: 2.9 g/dL — ABNORMAL LOW (ref 3.5–5.0)
Alkaline Phosphatase: 67 U/L (ref 38–126)
Anion gap: 11 (ref 5–15)
BUN: 104 mg/dL — ABNORMAL HIGH (ref 8–23)
CO2: 17 mmol/L — ABNORMAL LOW (ref 22–32)
Calcium: 7.9 mg/dL — ABNORMAL LOW (ref 8.9–10.3)
Chloride: 104 mmol/L (ref 98–111)
Creatinine, Ser: 3.3 mg/dL — ABNORMAL HIGH (ref 0.61–1.24)
GFR calc Af Amer: 21 mL/min — ABNORMAL LOW
GFR calc non Af Amer: 18 mL/min — ABNORMAL LOW
Glucose, Bld: 229 mg/dL — ABNORMAL HIGH (ref 70–99)
Potassium: 3.8 mmol/L (ref 3.5–5.1)
Sodium: 132 mmol/L — ABNORMAL LOW (ref 135–145)
Total Bilirubin: 2.5 mg/dL — ABNORMAL HIGH (ref 0.3–1.2)
Total Protein: 5.4 g/dL — ABNORMAL LOW (ref 6.5–8.1)

## 2017-10-21 LAB — CBC WITH DIFFERENTIAL/PLATELET
Basophils Absolute: 0 K/uL (ref 0–0.1)
Basophils Relative: 0 %
Eosinophils Absolute: 0 K/uL (ref 0–0.7)
Eosinophils Relative: 0 %
HCT: 29.8 % — ABNORMAL LOW (ref 40.0–52.0)
Hemoglobin: 10.2 g/dL — ABNORMAL LOW (ref 13.0–18.0)
Lymphocytes Relative: 5 %
Lymphs Abs: 0.4 K/uL — ABNORMAL LOW (ref 1.0–3.6)
MCH: 29.1 pg (ref 26.0–34.0)
MCHC: 34.3 g/dL (ref 32.0–36.0)
MCV: 84.7 fL (ref 80.0–100.0)
Monocytes Absolute: 0.5 K/uL (ref 0.2–1.0)
Monocytes Relative: 7 %
Neutro Abs: 6.9 K/uL — ABNORMAL HIGH (ref 1.4–6.5)
Neutrophils Relative %: 88 %
Platelets: 120 K/uL — ABNORMAL LOW (ref 150–440)
RBC: 3.52 MIL/uL — ABNORMAL LOW (ref 4.40–5.90)
RDW: 16.7 % — ABNORMAL HIGH (ref 11.5–14.5)
WBC: 7.8 K/uL (ref 3.8–10.6)

## 2017-10-21 LAB — CULTURE, BLOOD (ROUTINE X 2)
CULTURE: NO GROWTH
CULTURE: NO GROWTH

## 2017-10-21 LAB — GLUCOSE, CAPILLARY
GLUCOSE-CAPILLARY: 304 mg/dL — AB (ref 70–99)
GLUCOSE-CAPILLARY: 186 mg/dL — AB (ref 70–99)
GLUCOSE-CAPILLARY: 227 mg/dL — AB (ref 70–99)
Glucose-Capillary: 230 mg/dL — ABNORMAL HIGH (ref 70–99)

## 2017-10-21 LAB — AMMONIA: Ammonia: <9 umol/L — ABNORMAL LOW (ref 9–35)

## 2017-10-21 LAB — HEPARIN INDUCED PLATELET AB (HIT ANTIBODY): Heparin Induced Plt Ab: 0.32 {OD_unit} (ref 0.000–0.400)

## 2017-10-21 LAB — EPSTEIN-BARR VIRUS VCA, IGM: EBV VCA IgM: <36 U/mL (ref 0.0–35.9)

## 2017-10-21 LAB — CMV IGM: CMV IgM: <30 [AU]/ml (ref 0.0–29.9)

## 2017-10-21 MED ORDER — PREDNISONE 50 MG PO TABS: 50.0000 mg | ORAL_TABLET | Freq: Every day | ORAL | Status: DC

## 2017-10-21 MED ORDER — ACETAMINOPHEN 325 MG PO TABS
650.0000 mg | ORAL_TABLET | Freq: Four times a day (QID) | ORAL | Status: DC | PRN
  Administered 2017-10-21: 650 mg via ORAL
  Filled 2017-10-21: qty 2

## 2017-10-21 MED ORDER — CIPROFLOXACIN HCL 500 MG PO TABS: 500.0000 mg | ORAL_TABLET | Freq: Every day | ORAL | Status: DC

## 2017-10-21 MED ORDER — SODIUM CHLORIDE 0.9 % IV SOLN
INTRAVENOUS | Status: DC
  Administered 2017-10-21 – 2017-10-23 (×3): via INTRAVENOUS

## 2017-10-21 MED ORDER — INSULIN GLARGINE 100 UNIT/ML ~~LOC~~ SOLN
9.0000 [IU] | Freq: Every day | SUBCUTANEOUS | Status: DC
  Administered 2017-10-22 – 2017-10-24 (×3): 9 [IU] via SUBCUTANEOUS
  Filled 2017-10-21 (×5): qty 0.09

## 2017-10-21 MED ORDER — INSULIN ASPART 100 UNIT/ML ~~LOC~~ SOLN
5.0000 [IU] | Freq: Three times a day (TID) | SUBCUTANEOUS | Status: DC
  Administered 2017-10-21 – 2017-10-24 (×7): 5 [IU] via SUBCUTANEOUS
  Filled 2017-10-21 (×8): qty 1

## 2017-10-21 MED ORDER — CEFDINIR 300 MG PO CAPS
300.0000 mg | ORAL_CAPSULE | Freq: Every day | ORAL | Status: AC
  Administered 2017-10-22 – 2017-10-24 (×3): 300 mg via ORAL
  Filled 2017-10-21 (×3): qty 1

## 2017-10-21 NOTE — Progress Notes (Signed)
Inpatient Diabetes Program Recommendations  AACE/ADA: New Consensus Statement on Inpatient Glycemic Control (2015)  Target Ranges:  Prepandial:   less than 140 mg/dL      Peak postprandial:   less than 180 mg/dL (1-2 hours)      Critically ill patients:  140 - 180 mg/dL   Results for HAIZE, OSSMAN (MRN [id number]) as of 10/21/2017 12:43  Ref. Range 10/20/2017 08:21 10/20/2017 17:07 10/20/2017 21:03  Glucose-Capillary Latest Ref Range: 70 - 99 mg/dL 300 (H) 923 (H)  11 units NOVOLOG  278 (H)    Results for KAIYON, BIRKE (MRN [id number]) as of 10/21/2017 12:43  Ref. Range 10/21/2017 08:09 10/21/2017 11:43  Glucose-Capillary Latest Ref Range: 70 - 99 mg/dL 335 (H)  8 units NOVOLOG  304 (H)    Home DM Meds: Metformin 500 mg BID  Current Insulin Orders: Novolog Moderate Correction Scale/ SSI (0-15 units) TID AC       Novolog 3 units TID with meals      Note patient was getting IV Solumedrol.  Last dose Solumedrol given yesterday at 4pm.  CBGs still quite elevated even though steroids stopped.     MD- Please consider the following in-hospital insulin adjustments if CBGs continue to stay elevated:  1. Start low dose basal insulin: Lantus 9 units QHS (0.1 units/kg dosing based on weight of 90 kg)  2. Increase Novolog Meal Coverage slightly to: Novolog 5 units TID with meals (Please add the following Hold Parameters: Hold if pt eats <50% of meal, Hold if pt NPO)      --Will follow patient during hospitalization--  Ambrose Finland RN, MSN, CDE Diabetes Coordinator Inpatient Glycemic Control Team Team Pager: [phone] (8a-5p)

## 2017-10-21 NOTE — Progress Notes (Signed)
PHARMACY NOTE:  ANTIMICROBIAL RENAL DOSAGE ADJUSTMENT  Current antimicrobial regimen includes a mismatch between antimicrobial dosage and estimated renal function.  As per policy approved by the Pharmacy & Therapeutics and Medical Executive Committees, the antimicrobial dosage will be adjusted accordingly.  Current antimicrobial dosage:  Cipro 500mg  BID  Indication: UTI  Renal Function:  Estimated Creatinine Clearance: 24.7 mL/min (A) (by C-G formula based on SCr of 3.3 mg/dL (H)). []      On intermittent HD, scheduled: []      On CRRT    Antimicrobial dosage has been changed to:  Cipro 500mg  q 24hr  Additional comments:   Thank you for allowing pharmacy to be a part of this patient's care.  Olene Floss, Pharm.D, BCPS Clinical Pharmacist 10/21/2017 1:27 PM

## 2017-10-21 NOTE — Progress Notes (Signed)
Physical Therapy Treatment Patient Details Name: Derek Barnes MRN: [id number] DOB: Aug 23, 1950 Today's Date: 10/21/2017    History of Present Illness Derek Barnes is a 67yo male who comes [facility] on 7/23 with BLE rash, and associated dyspnea, respiratory distress, and tachycardia. Pt required intubation, (self extubated) , and also noted to have a UTI c e Coli. 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 PMH: severe cardiomyopathy with 10% EF, AF, AICD, DM, HTN, CVA, CKD.     PT Comments    Pt perseverating on going home today and needing to pay his bills (MD present during session and aware).  1st attempt standing pt unsteady requiring min assist to steady but 2nd attempt pt CGA.  During ambulation pt CGA with ambulation although mildly unsteady at times (no overt loss of balance noted).   Pt unsteady and unsafe ascending navigating 6 steps with railing (pt with foot only partially on steps each step causing balance impairments requiring assist to regain balance); pt requiring cueing for step to pattern and decreased cadence descending stairs.  Pt demonstrating impulsiveness and decreased safety awareness during session's activities.  Nurse and pt's friend report pt was hallucinating this morning (not noted during therapy session).  Overall pt demonstrating generalized weakness, impaired balance, and SOB with activity (O2 sats WFL on room air during session).  Will continue to progress pt with strengthening, balance, and increasing independence with functional mobility per pt tolerance.   Follow Up Recommendations  SNF     Equipment Recommendations  Rolling walker with 5" wheels    Recommendations for Other Services       Precautions / Restrictions Precautions Precautions: Fall Restrictions Weight Bearing Restrictions: No    Mobility  Bed Mobility               General bed mobility comments: Deferred (pt up in chair beginning and end of  session)  Transfers Overall transfer level: Needs assistance Equipment used: None Transfers: Sit to/from Stand;Stand Pivot Transfers Sit to Stand: Min guard;Min assist Stand pivot transfers: Min guard       General transfer comment: pt initially unsteady standing from recliner requiring min assist for balance (2nd attempt CGA); CGA stand step turn (decreased cadence with increased BOS noted)  Ambulation/Gait Ambulation/Gait assistance: Min guard;+2 safety/equipment Gait Distance (Feet): 200 Feet Assistive device: None   Gait velocity: decreased   General Gait Details: wide BOS; decreased B foot clearance and heelstrike; mildly unsteady at times but no overt loss of balance noted   Stairs Stairs: Yes Stairs assistance: Min assist;+2 safety/equipment Stair Management: One rail Right;Forwards Number of Stairs: 6 General stair comments: pt unsteady and unsafe navigating 6 steps with railing (pt with foot only partially on steps causing balance impairments requiring assist to regain balance); cueing for step to pattern and decreased cadence descending stairs   Wheelchair Mobility    Modified Rankin (Stroke Patients Only)       Balance Overall balance assessment: Needs assistance Sitting-balance support: No upper extremity supported;Feet supported Sitting balance-Leahy Scale: Normal Sitting balance - Comments: steady sitting reaching outside BOS   Standing balance support: During functional activity Standing balance-Leahy Scale: Fair Standing balance comment: mild unsteadiness with ambulation but no overt loss of balance noted                            Cognition Arousal/Alertness: Awake/alert Behavior During Therapy: Impulsive(Perseverating on going home and paying bills) Overall  Cognitive Status: (Oriented to person, place, time, and situation.  Knew president, address.  Did not know phone number)                                         Exercises      General Comments   Nursing cleared pt for participation in physical therapy.  Pt agreeable to PT session.      Pertinent Vitals/Pain Pain Assessment: No/denies pain  HR WFL during session.    Home Living                      Prior Function            PT Goals (current goals can now be found in the care plan section) Acute Rehab PT Goals Patient Stated Goal: Regain independence in mobility PT Goal Formulation: With patient Time For Goal Achievement: 11/02/17 Potential to Achieve Goals: Fair Progress towards PT goals: Progressing toward goals    Frequency    Min 2X/week      PT Plan Current plan remains appropriate    Co-evaluation              AM-PAC PT "6 Clicks" Daily Activity  Outcome Measure  Difficulty turning over in bed (including adjusting bedclothes, sheets and blankets)?: A Little Difficulty moving from lying on back to sitting on the side of the bed? : A Little Difficulty sitting down on and standing up from a chair with arms (e.g., wheelchair, bedside commode, etc,.)?: Unable Help needed moving to and from a bed to chair (including a wheelchair)?: A Little Help needed walking in hospital room?: A Little Help needed climbing 3-5 steps with a railing? : A Little 6 Click Score: 16    End of Session Equipment Utilized During Treatment: Gait belt Activity Tolerance: Other (comment)(SOB noted with activity) Patient left: in chair;with call bell/phone within reach;with chair alarm set;with family/visitor present Nurse Communication: Mobility status;Precautions PT Visit Diagnosis: Unsteadiness on feet (R26.81);Other abnormalities of gait and mobility (R26.89);Muscle weakness (generalized) (M62.81);Ataxic gait (R26.0)     Time: 1610-9604 PT Time Calculation (min) (ACUTE ONLY): 33 min  Charges:  $Gait Training: 8-22 mins $Therapeutic Activity: 8-22 mins                    Hendricks Limes, PT 10/21/17, 1:26  PM [phone]

## 2017-10-21 NOTE — Progress Notes (Signed)
[facility] [place] NAME: Derek Barnes    MR#:  [id number]  DATE OF BIRTH:  [birth year]  SUBJECTIVE: Admitted to telemetry initially but had respiratory distress, wide-complex tachycardia yesterday so transferred to intensive care unit.  Patient respiratory status better, denies chest pain.  On amiodarone drip, phenylephrine for hypotension.  Discontinued amiodarone drip when patient got transferred to telemetry bed on 10/17/2017.  No further fever..  CHIEF COMPLAINT:   Chief Complaint  Patient presents with  . Shortness of Breath  . Nausea  Patient has good urine output.. Hallucinating intermittently but answers questions appropriately.  Patient's friend at bedside.  He does not have any family members as reported by the friend REVIEW OF SYSTEMS:    Review of Systems  Constitutional: Negative for chills and fever.  HENT: Negative for hearing loss.   Eyes: Negative for blurred vision, double vision and photophobia.  Respiratory: Negative for cough, hemoptysis and shortness of breath.   Cardiovascular: Negative for palpitations, orthopnea and leg swelling.  Gastrointestinal: Negative for abdominal pain, diarrhea and vomiting.  Genitourinary: Negative for dysuria and urgency.  Musculoskeletal: Negative for myalgias and neck pain.  Skin: Negative for rash.  Neurological: Negative for dizziness, focal weakness, seizures, weakness and headaches.  Psychiatric/Behavioral: Negative for memory loss. The patient does not have insomnia.    Nutrition:  Tolerating Diet: Tolerating PT:      DRUG ALLERGIES:   Allergies  Allergen Reactions  . Contrast Media [Iodinated Diagnostic Agents] Anaphylaxis  . Penicillins Anaphylaxis    Has patient had a PCN reaction causing immediate rash, facial/tongue/throat swelling, SOB or lightheadedness with hypotension: Yes Has patient had a PCN reaction causing severe rash involving mucus membranes or skin  necrosis: Yes Has patient had a PCN reaction that required hospitalization: No Has patient had a PCN reaction occurring within the last 10 years: No If all of the above answers are "NO", then may proceed with Cephalosporin use.   . Sotalol Other (See Comments)    Excessive fatigue    VITALS:  Blood pressure 97/70, pulse 70, temperature 98.7 F (37.1 C), temperature source Oral, resp. rate 18, height '5\' 8"'$  (1.727 m), weight 90.4 kg (199 lb 3.2 oz), SpO2 100 %.  PHYSICAL EXAMINATION:   Physical Exam  GENERAL:  67 y.o.-year-old patient lying in the bed with obviousrespiratory distress. EYES: Pupils equal, round, reactive to light and accommodation. No scleral icterus.  HEENT: Head atraumatic, normocephalic. Oropharynx and nasopharynx clear.  NECK:  Supple, no jugular venous distention. No thyroid enlargement, no tenderness.  LUNGS: Bibasilar crepitations.  Marland Kitchen  CARDIOVASCULAR:  S1, S2  Irregular. .  ABDOMEN: Soft, nontender, nondistended. Bowel sounds present. No organomegaly or mass.  EXTREMITIES:trace pedal edema..  Cyanosis, or clubbing.  NEUROLOGIC, awake, oriented.  Cranial nerves II through XII intact, 5/5 upper and lowerextremities/.  Sensations are intact.  DTRs 2+ bilaterally.   PSYCHIATRIC: The patient is alert and oriented x 3.  During my visit SKIN: No obvious rash, lesion, or ulcer.    LABORATORY PANEL:   CBC Recent Labs  Lab 10/21/17 0935  WBC 7.8  HGB 10.2*  HCT 29.8*  PLT 120*   ------------------------------------------------------------------------------------------------------------------  Chemistries  Recent Labs  Lab 10/21/17 0935  NA 132*  K 3.8  CL 104  CO2 17*  GLUCOSE 229*  BUN 104*  CREATININE 3.30*  CALCIUM 7.9*  AST 273*  ALT 627*  ALKPHOS 67  BILITOT 2.5*   ------------------------------------------------------------------------------------------------------------------  Cardiac  Enzymes Recent Labs  Lab 10/17/17 0409  TROPONINI  1.58*   ------------------------------------------------------------------------------------------------------------------  RADIOLOGY:  No results found.   ASSESSMENT AND PLAN:   Active Problems:   FUO (fever of unknown origin)   Congestive dilated cardiomyopathy (HCC)   Goals of care, counseling/discussion   Palliative care by specialist   Elevated liver enzymes  67 year old male patient with history of atrial fibrillation, V. tach, status post AICD placement and follows with [facility] cardiology, patient also has diabetes mellitus type 2, CVA admitted for maculopapular rash and fever and AICD firing.  Patient had respiratory distress with tachycardia yesterday, transferred to ICU.   # acute renal failure patient has CKD stage III; slightly worse today. BUN 62-71 Creatinine 3.01-3.22  --3.0.  3.30  Baseline creatinine at 1.3   monitor closely..  start gentle hydration with IV fluids.   Avoid nephrotoxins    #Hallucinations could be from steroid flare discontinued steroids Psychiatry consult placed   #Shock liver secondary to congestive hepatopathy:  Continue monitoring LFTs-trending down.  Ammonia level normal  abdominal ultrasound no ascites  GI  Following. Hepatitis panel negative  checking for HSV, Epstein-Barr virus and CMV  # .  Acute respiratory distress secondary to acute pulmonary edema: Received 40 mg of IV Lasix one-time, transferred to stepdown unit for BiPAP support, patient has history of severe dilated cardiopathy, history of V. tach, patient received amiodarone loading/infusion, c and by Dr. Clayborn Bigness from cardiology she needs AICD interrogation.  Patient clinically improved and discontinued amiodarone drip and changed to p.o. Amiodarone.  Patient got transferred to floor today 10/17/2017  #Thrombocytopenia could be from sepsis or heparin-induced Platelet count is slightly better today 54,000-74,000 HIT panel - pending Discontinued heparin No active bleeding or  bruising  #.  Atrial fibrillation: Patient was on amiodarone drip, heparin drip, both are discontinued currently rate controlled.  Amiodarone changed to p.o.   #Sepsis met criteria with fever and tachycardia secondary to pneumonia and UTI  fever resolved  UTI, patient also had maculopapular rash s by Dr. Graylon Good from ID, recommended  Rocephin,  Blood cultures 1 out of 4 sets positive for staph species.  Will change antibiotics to p.o. Cipro which covers both pneumonia and UTI urineCulture showing more than 100,000 colonies of E. coli.  Blood cultures are no growth till date.  gentle hydration with IV fluids provided and recheck lactic acid levels and procalcitonin Steroids and bronchodilators are added to the regimen for bronchospasm from underlying pneumonia  #Metabolic acidosis from sepsis, compensated with respiratory alkalosis gentle hydration with IV fluids provided and recheck lactic acid levels and procalcitonin   Acute on chronic systolic heart failure, BNP 705, cardiogram this time showed EF 10 to 15%.  status post AICD.  Continue, heparin drip because of V. tach episodes, Continue Entresto.  Patient on digoxin.    Patient is at high risk for cardiac arrest with high mortality rate Consult palliative care.  Anticipate discharge in 1 to 2 days    .  all the records are reviewed and case discussed with Care Management/Social Workerr. Management plans discussed with the patient, family and they are in agreement.  CODE STATUS:full  TOTAL TIME TAKING CARE OF THIS PATIENT: 39  minutes.   Patient is too unstable for discharge planning.  Patient high risk for cardiac arrest in critically ill at this time,. Discussed with patient's  Daughter like person  patient has no family, daughter is in the process of getting healthcare power of attorney papers, she  needs help with chaplain.  Nicholes Mango M.D on 10/21/2017 at 4:10 PM  Between 7am to 6pm - Pager - [phone]   After 6pm go  to [email or web address] - password [email or web address] [facility]  [facility]  Office  [phone]  CC: Primary care physician; Phineas Inches, MD

## 2017-10-21 NOTE — Progress Notes (Signed)
[facility], [place] 10/21/17  Subjective:  Patient quite confused at the moment. BUN also up to 104. This was discussed with hospitalist and steroids to be stopped.   Objective:  Vital signs in last 24 hours:  Temp:  [97.9 F (36.6 C)-98.7 F (37.1 C)] 98.7 F (37.1 C) (07/29 0814) Pulse Rate:  [61-70] 70 (07/29 0814) Resp:  [17-18] 18 (07/29 0814) BP: (97-109)/(65-78) 97/70 (07/29 0814) SpO2:  [100 %] 100 % (07/29 0814)  Weight change:  Filed Weights   10/16/17 1017 10/16/17 1155 10/19/17 0357  Weight: 86.3 kg (190 lb 4.8 oz) 86.3 kg (190 lb 4.8 oz) 90.4 kg (199 lb 3.2 oz)    Intake/Output:    Intake/Output Summary (Last 24 hours) at 10/21/2017 1235 Last data filed at 10/21/2017 1014 Gross per 24 hour  Intake 360 ml  Output 400 ml  Net -40 ml     Physical Exam: General:  No acute distress, sitting up edge of the bed  HEENT  anicteric, moist oral mucous membranes  Neck:  Supple  Lungs:  Clear to auscultation bilaterally  Heart::  Irregular rhythm, no rub  Abdomen:  Soft, NTND BS present  Extremities:  2+ peripheral edema  Neurologic:  Awake but confused  Skin:  skin in LE covered this AM  Foley:  Present    Basic Metabolic Panel:  Recent Labs  Lab 10/18/17 0847 10/18/17 1914 10/19/17 0627 10/20/17 0027 10/21/17 0935  NA 135 133* 135 133* 132*  K 3.6 4.2 4.1 3.9 3.8  CL 107 104 107 105 104  CO2 17* 12* 14* 16* 17*  GLUCOSE 144* 164* 202* 233* 229*  BUN 52* 62* 71* 91* 104*  CREATININE 2.55* 3.01* 3.22* 3.02* 3.30*  CALCIUM 7.5* 7.6* 7.5* 7.5* 7.9*     CBC: Recent Labs  Lab 10/15/17 2010  10/17/17 0409 10/18/17 0847 10/19/17 0627 10/20/17 0027 10/21/17 0935  WBC 8.0   < > 7.5 4.0 5.5 7.4 7.8  NEUTROABS 6.6*  --   --   --  4.6 6.5 6.9*  HGB 11.2*   < > 11.2* 10.0* 10.8* 10.8* 10.2*  HCT 33.2*   < > 32.9* 29.2* 32.2* 31.8* 29.8*  MCV 86.6   < > 87.2 86.3 86.5 84.4 84.7  PLT 113*   < > 101* COUNT MAY BE  INACCURATE DUE TO FIBRIN CLUMPS. 54* 74* 120*   < > = values in this interval not displayed.      Lab Results  Component Value Date   HEPBSAG Negative 10/17/2017   HEPBIGM Negative 10/17/2017      Microbiology:  Recent Results (from the past 240 hour(s))  Blood Culture (routine x 2)     Status: Abnormal   Collection Time: 10/15/17  8:10 PM  Result Value Ref Range Status   Specimen Description   Final    BLOOD LEFT ANTECUBITAL Performed at [facility], [address] [place], [place] [address]    Special Requests   Final    BOTTLES DRAWN AEROBIC AND ANAEROBIC Blood Culture adequate volume Performed at [facility], [address] [place], [place] [address]    Culture  Setup Time   Final    AEROBIC BOTTLE ONLY GRAM POSITIVE COCCI CRITICAL RESULT CALLED TO, READ BACK BY AND VERIFIED WITH: SHEEMA HALLAJI 10/16/17 1437 KLW    Culture (A)  Final    STAPHYLOCOCCUS SPECIES (COAGULASE NEGATIVE) THE SIGNIFICANCE OF ISOLATING THIS ORGANISM FROM A SINGLE SET OF BLOOD  CULTURES WHEN MULTIPLE SETS ARE DRAWN IS UNCERTAIN. PLEASE NOTIFY THE MICROBIOLOGY DEPARTMENT WITHIN ONE WEEK IF SPECIATION AND SENSITIVITIES ARE REQUIRED. Performed at [facility], [place] [address], [place], [place] [address]    Report Status 10/18/2017 FINAL  Final  Blood Culture (routine x 2)     Status: None   Collection Time: 10/15/17  8:10 PM  Result Value Ref Range Status   Specimen Description BLOOD BLOOD RIGHT WRIST  Final   Special Requests   Final    BOTTLES DRAWN AEROBIC AND ANAEROBIC Blood Culture adequate volume   Culture   Final    NO GROWTH 5 DAYS Performed at [facility], [address] [place], [place] [address]    Report Status 10/20/2017 FINAL  Final  Blood Culture ID Panel (Reflexed)     Status: Abnormal   Collection Time: 10/15/17  8:10 PM  Result Value Ref Range Status   Enterococcus species NOT DETECTED NOT DETECTED Final   Listeria monocytogenes NOT  DETECTED NOT DETECTED Final   Staphylococcus species DETECTED (A) NOT DETECTED Final    Comment: Methicillin (oxacillin) susceptible coagulase negative staphylococcus. Possible blood culture contaminant (unless isolated from more than one blood culture draw or clinical case suggests pathogenicity). No antibiotic treatment is indicated for blood  culture contaminants. CRITICAL RESULT CALLED TO, READ BACK BY AND VERIFIED WITH: SHEEMA HALLAJI 10/16/17 1437 KLW    Staphylococcus aureus NOT DETECTED NOT DETECTED Final   Methicillin resistance NOT DETECTED NOT DETECTED Final   Streptococcus species NOT DETECTED NOT DETECTED Final   Streptococcus agalactiae NOT DETECTED NOT DETECTED Final   Streptococcus pneumoniae NOT DETECTED NOT DETECTED Final   Streptococcus pyogenes NOT DETECTED NOT DETECTED Final   Acinetobacter baumannii NOT DETECTED NOT DETECTED Final   Enterobacteriaceae species NOT DETECTED NOT DETECTED Final   Enterobacter cloacae complex NOT DETECTED NOT DETECTED Final   Escherichia coli NOT DETECTED NOT DETECTED Final   Klebsiella oxytoca NOT DETECTED NOT DETECTED Final   Klebsiella pneumoniae NOT DETECTED NOT DETECTED Final   Proteus species NOT DETECTED NOT DETECTED Final   Serratia marcescens NOT DETECTED NOT DETECTED Final   Haemophilus influenzae NOT DETECTED NOT DETECTED Final   Neisseria meningitidis NOT DETECTED NOT DETECTED Final   Pseudomonas aeruginosa NOT DETECTED NOT DETECTED Final   Candida albicans NOT DETECTED NOT DETECTED Final   Candida glabrata NOT DETECTED NOT DETECTED Final   Candida krusei NOT DETECTED NOT DETECTED Final   Candida parapsilosis NOT DETECTED NOT DETECTED Final   Candida tropicalis NOT DETECTED NOT DETECTED Final    Comment: Performed at [facility], [address], [place], [place] [address]  Urine culture     Status: Abnormal   Collection Time: 10/16/17  4:11 AM  Result Value Ref Range Status   Specimen Description   Final     URINE, RANDOM Performed at [facility], [address], [place], [place] [address]    Special Requests   Final    NONE Performed at [facility], [address] [place], [place] [address]    Culture >=100,000 COLONIES/mL ESCHERICHIA COLI (A)  Final   Report Status 10/18/2017 FINAL  Final   Organism ID, Bacteria ESCHERICHIA COLI (A)  Final      Susceptibility   Escherichia coli - MIC*    AMPICILLIN 8 SENSITIVE Sensitive     CEFAZOLIN <=4 SENSITIVE Sensitive     CEFTRIAXONE <=1 SENSITIVE Sensitive     CIPROFLOXACIN <=0.25 SENSITIVE Sensitive  GENTAMICIN <=1 SENSITIVE Sensitive     IMIPENEM <=0.25 SENSITIVE Sensitive     NITROFURANTOIN <=16 SENSITIVE Sensitive     TRIMETH/SULFA <=20 SENSITIVE Sensitive     AMPICILLIN/SULBACTAM 4 SENSITIVE Sensitive     PIP/TAZO <=4 SENSITIVE Sensitive     Extended ESBL NEGATIVE Sensitive     * >=100,000 COLONIES/mL ESCHERICHIA COLI  CULTURE, BLOOD (ROUTINE X 2) w Reflex to ID Panel     Status: None   Collection Time: 10/16/17  4:12 PM  Result Value Ref Range Status   Specimen Description BLOOD BLOOD LEFT HAND  Final   Special Requests   Final    BOTTLES DRAWN AEROBIC AND ANAEROBIC Blood Culture results may not be optimal due to an excessive volume of blood received in culture bottles   Culture   Final    NO GROWTH 5 DAYS Performed at [facility], [address] [place], [place] [address]    Report Status 10/21/2017 FINAL  Final  CULTURE, BLOOD (ROUTINE X 2) w Reflex to ID Panel     Status: None   Collection Time: 10/16/17  4:43 PM  Result Value Ref Range Status   Specimen Description BLOOD LEFT HAND  Final   Special Requests   Final    BOTTLES DRAWN AEROBIC AND ANAEROBIC Blood Culture results may not be optimal due to an inadequate volume of blood received in culture bottles   Culture   Final    NO GROWTH 5 DAYS Performed at [facility], [address] [place], [place] [address]    Report  Status 10/21/2017 FINAL  Final  MRSA PCR Screening     Status: None   Collection Time: 10/16/17  5:01 PM  Result Value Ref Range Status   MRSA by PCR NEGATIVE NEGATIVE Final    Comment:        The GeneXpert MRSA Assay (FDA approved for NASAL specimens only), is one component of a comprehensive MRSA colonization surveillance program. It is not intended to diagnose MRSA infection nor to guide or monitor treatment for MRSA infections. Performed at [facility], [address] [place], [place] [address]     Coagulation Studies: No results for input(s): LABPROT, INR in the last 72 hours.  Urinalysis: No results for input(s): COLORURINE, LABSPEC, PHURINE, GLUCOSEU, HGBUR, BILIRUBINUR, KETONESUR, PROTEINUR, UROBILINOGEN, NITRITE, LEUKOCYTESUR in the last 72 hours.  Invalid input(s): APPERANCEUR    Imaging: No results found.   Medications:    . amiodarone  200 mg Oral Daily  . aspirin  81 mg Oral Daily  . chlorhexidine gluconate (MEDLINE KIT)  15 mL Mouth Rinse BID  . ciprofloxacin  500 mg Oral BID  . digoxin  125 mcg Oral QODAY  . famotidine  20 mg Oral Daily  . insulin aspart  0-15 Units Subcutaneous TID WC  . insulin aspart  3 Units Subcutaneous TID WC  . metoprolol tartrate  25 mg Oral BID  . nitroGLYCERIN  0.3 mg Transdermal Daily   acetaminophen, albuterol, ALPRAZolam, bisacodyl, docusate, ipratropium-albuterol, metoprolol tartrate, nitroGLYCERIN, [DISCONTINUED] ondansetron **OR** ondansetron (ZOFRAN) IV, senna-docusate, traZODone  Assessment/ Plan:  67 y.o. Caucasian male with medical problems of severe cardiomyopathy with LVEF less than 10% followed at [facility], atrial fibrillation, AICD diabetes, hypertension, history of stroke, CKD, who was admitted to [facility] on 10/15/2017 for evaluation of maculopapular rash over lower extremities and AICD firing.  Hospital course complicated by wide-complex tachycardia, severe respiratory distress, transferred to  ICU, ventilator support.  Patient self extubated.  1.  Acute renal failure, chronic kidney disease stage III Likely ATN from cardiorenal syndrome, hypoperfusion Baseline creatinine 1.32 at admission/GFR 55 BUN and creatinine have significantly risen over the course of the hospitalization.  Steroids playing a role in azotemia as BUN is up to 104.  Creatinine currently 3.3.  Consider restarting IV fluids with 0.9 normal saline at 40 cc/h.  2.  Urinary tract infection from E. coli Patient currently on ciprofloxacin 500 mg p.o. twice daily.  3.  Chronic systolic congestive heart failure with AICD Patient does not appear to be markedly fluid overloaded at the moment.  Given worsening renal function we will start the patient on 0.9 normal saline at 40 cc/h.      LOS: 5 [place] 7/29/201912:35 PM  [place], [place]  Note: This note was prepared with Dragon dictation. Any transcription errors are unintentional

## 2017-10-22 ENCOUNTER — Inpatient Hospital Stay: Payer: BC Managed Care – PPO

## 2017-10-22 DIAGNOSIS — K759 Inflammatory liver disease, unspecified: Secondary | ICD-10-CM

## 2017-10-22 DIAGNOSIS — R410 Disorientation, unspecified: Secondary | ICD-10-CM

## 2017-10-22 LAB — GLUCOSE, CAPILLARY
GLUCOSE-CAPILLARY: 179 mg/dL — AB (ref 70–99)
GLUCOSE-CAPILLARY: 196 mg/dL — AB (ref 70–99)
GLUCOSE-CAPILLARY: 129 mg/dL — AB (ref 70–99)
Glucose-Capillary: 134 mg/dL — ABNORMAL HIGH (ref 70–99)

## 2017-10-22 LAB — COMPREHENSIVE METABOLIC PANEL
ALT: 526 U/L — AB (ref 0–44)
AST: 176 U/L — ABNORMAL HIGH (ref 15–41)
Albumin: 3 g/dL — ABNORMAL LOW (ref 3.5–5.0)
Alkaline Phosphatase: 70 U/L (ref 38–126)
Anion gap: 8 (ref 5–15)
BILIRUBIN TOTAL: 2 mg/dL — AB (ref 0.3–1.2)
BUN: 110 mg/dL — ABNORMAL HIGH (ref 8–23)
CO2: 18 mmol/L — ABNORMAL LOW (ref 22–32)
CREATININE: 3.46 mg/dL — AB (ref 0.61–1.24)
Calcium: 8 mg/dL — ABNORMAL LOW (ref 8.9–10.3)
Chloride: 107 mmol/L (ref 98–111)
GFR, EST AFRICAN AMERICAN: 20 mL/min — AB (ref >60)
GFR, EST NON AFRICAN AMERICAN: 17 mL/min — AB (ref >60)
Glucose, Bld: 200 mg/dL — ABNORMAL HIGH (ref 70–99)
POTASSIUM: 4.1 mmol/L (ref 3.5–5.1)
Sodium: 133 mmol/L — ABNORMAL LOW (ref 135–145)
TOTAL PROTEIN: 5.2 g/dL — AB (ref 6.5–8.1)

## 2017-10-22 LAB — CBC WITH DIFFERENTIAL/PLATELET
Basophils Absolute: 0 10*3/uL (ref 0–0.1)
Basophils Relative: 0 %
EOS PCT: 0 %
Eosinophils Absolute: 0 10*3/uL (ref 0–0.7)
HEMATOCRIT: 28.7 % — AB (ref 40.0–52.0)
Hemoglobin: 10 g/dL — ABNORMAL LOW (ref 13.0–18.0)
LYMPHS ABS: 0.3 10*3/uL — AB (ref 1.0–3.6)
LYMPHS PCT: 5 %
MCH: 29.2 pg (ref 26.0–34.0)
MCHC: 34.6 g/dL (ref 32.0–36.0)
MCV: 84.3 fL (ref 80.0–100.0)
MONO ABS: 0.7 10*3/uL (ref 0.2–1.0)
Monocytes Relative: 9 %
Neutro Abs: 6.5 10*3/uL (ref 1.4–6.5)
Neutrophils Relative %: 86 %
PLATELETS: 125 10*3/uL — AB (ref 150–440)
RBC: 3.41 MIL/uL — ABNORMAL LOW (ref 4.40–5.90)
RDW: 17 % — AB (ref 11.5–14.5)
WBC: 7.5 10*3/uL (ref 3.8–10.6)

## 2017-10-22 LAB — PROTIME-INR
INR: 1.39
Prothrombin Time: 16.9 seconds — ABNORMAL HIGH (ref 11.4–15.2)

## 2017-10-22 LAB — MISC LABCORP TEST (SEND OUT)
LABCORP TEST CODE: 138412
Labcorp test code: 138318

## 2017-10-22 LAB — HSV 1 AND 2 IGM ABS, INDIRECT: HSV 2 IgM Antibodies: <1:10 {titer}

## 2017-10-22 MED ORDER — METOPROLOL SUCCINATE ER 50 MG PO TB24
50.0000 mg | ORAL_TABLET | Freq: Every day | ORAL | Status: DC
  Administered 2017-10-23 – 2017-10-24 (×2): 50 mg via ORAL
  Filled 2017-10-22 (×2): qty 1

## 2017-10-22 MED ORDER — HALOPERIDOL LACTATE 5 MG/ML IJ SOLN
1.0000 mg | Freq: Three times a day (TID) | INTRAMUSCULAR | Status: DC
  Administered 2017-10-22 – 2017-10-23 (×4): 1 mg via INTRAVENOUS
  Filled 2017-10-22 (×4): qty 1

## 2017-10-22 MED ORDER — METOPROLOL TARTRATE 25 MG PO TABS
25.0000 mg | ORAL_TABLET | Freq: Two times a day (BID) | ORAL | Status: AC
  Administered 2017-10-22: 25 mg via ORAL
  Filled 2017-10-22: qty 1

## 2017-10-22 NOTE — Clinical Social Work Note (Signed)
CSW spoke with patient, and he states he does not want to go to SNF for short term rehab.  Patient stated he would prefer to go home with home health, CSW notified case manager.  CSW to continue to follow in case patient changes his mind.  Derek Barnes. Derek Barnes, MSW, Derek Barnes [phone]  10/22/2017 4:31 PM

## 2017-10-22 NOTE — Plan of Care (Signed)
No respiratory distress noted.  Continues to hallucinate.  Seeing men climbing through window. Cooperative with remaining in chair with alarm in place.

## 2017-10-22 NOTE — Progress Notes (Signed)
[place] [facility] [place] NAME: Derek Barnes    MR#:  [id number]  DATE OF BIRTH:  09-19-1950  SUBJECTIVE: Admitted to telemetry initially but had respiratory distress, wide-complex tachycardia yesterday so transferred to intensive care unit.  Patient respiratory status better, denies chest pain.  On amiodarone drip, phenylephrine for hypotension.  Discontinued amiodarone drip when patient got transferred to telemetry bed on 10/17/2017.  No further fever..  CHIEF COMPLAINT:   Chief Complaint  Patient presents with  . Shortness of Breath  . Nausea  Patient is still  hallucinating intermittently , reports weak urine stream sometimes palliative care is involved,he does not have any family members as reported by the friend  REVIEW OF SYSTEMS:    Review of Systems  Constitutional: Negative for chills and fever.  HENT: Negative for hearing loss.   Eyes: Negative for blurred vision, double vision and photophobia.  Respiratory: Negative for cough, hemoptysis and shortness of breath.   Cardiovascular: Negative for palpitations, orthopnea and leg swelling.  Gastrointestinal: Negative for abdominal pain, diarrhea and vomiting.  Genitourinary: Negative for dysuria and urgency.  Musculoskeletal: Negative for myalgias and neck pain.  Skin: Negative for rash.  Neurological: Negative for dizziness, focal weakness, seizures, weakness and headaches.  Psychiatric/Behavioral: Negative for memory loss. The patient does not have insomnia.    Nutrition:  Tolerating Diet: Tolerating PT:      DRUG ALLERGIES:   Allergies  Allergen Reactions  . Contrast Media [Iodinated Diagnostic Agents] Anaphylaxis  . Penicillins Anaphylaxis    Has patient had a PCN reaction causing immediate rash, facial/tongue/throat swelling, SOB or lightheadedness with hypotension: Yes Has patient had a PCN reaction causing severe rash involving mucus membranes or skin necrosis:  Yes Has patient had a PCN reaction that required hospitalization: No Has patient had a PCN reaction occurring within the last 10 years: No If all of the above answers are "NO", then may proceed with Cephalosporin use.   . Sotalol Other (See Comments)    Excessive fatigue    VITALS:  Blood pressure 114/71, pulse 68, temperature (!) 97.4 F (36.3 C), temperature source Oral, resp. rate 18, height '5\' 8"'$  (1.727 m), weight 92 kg (202 lb 12.8 oz), SpO2 100 %.  PHYSICAL EXAMINATION:   Physical Exam  GENERAL:  66 y.o.-year-old patient lying in the bed with obviousrespiratory distress. EYES: Pupils equal, round, reactive to light and accommodation. No scleral icterus.  HEENT: Head atraumatic, normocephalic. Oropharynx and nasopharynx clear.  NECK:  Supple, no jugular venous distention. No thyroid enlargement, no tenderness.  LUNGS: Bibasilar crepitations.  Marland Kitchen  CARDIOVASCULAR:  S1, S2  Irregular. .  ABDOMEN: Soft, nontender, nondistended. Bowel sounds present. No organomegaly or mass.  EXTREMITIES:trace pedal edema..  Cyanosis, or clubbing.  NEUROLOGIC, awake, oriented.  Cranial nerves II through XII intact, 5/5 upper and lowerextremities/.  Sensations are intact.  DTRs 2+ bilaterally.   PSYCHIATRIC: The patient is alert and oriented x 3.  During my visit SKIN: No obvious rash, lesion, or ulcer.    LABORATORY PANEL:   CBC Recent Labs  Lab 10/22/17 0431  WBC 7.5  HGB 10.0*  HCT 28.7*  PLT 125*   ------------------------------------------------------------------------------------------------------------------  Chemistries  Recent Labs  Lab 10/22/17 0431  NA 133*  K 4.1  CL 107  CO2 18*  GLUCOSE 200*  BUN 110*  CREATININE 3.46*  CALCIUM 8.0*  AST 176*  ALT 526*  ALKPHOS 70  BILITOT 2.0*   ------------------------------------------------------------------------------------------------------------------  Cardiac  Enzymes Recent Labs  Lab 10/17/17 0409  TROPONINI 1.58*    ------------------------------------------------------------------------------------------------------------------  RADIOLOGY:  US Renal  Result Date: 10/22/2017 CLINICAL DATA:  Acute renal failure EXAM: RENAL / URINARY TRACT ULTRASOUND COMPLETE COMPARISON:  10/18/2017 FINDINGS: Right Kidney: Length: 10.7 cm. Echogenicity within normal limits. No mass or hydronephrosis visualized. Left Kidney: Length: 11.5 cm. Echogenicity within normal limits. No mass or hydronephrosis visualized. Bladder: Appears normal for degree of bladder distention. IMPRESSION: Unremarkable renal ultrasound. Electronically Signed   By: Rolm Baptise M.D.   On: 10/22/2017 10:02     ASSESSMENT AND PLAN:   Principal Problem:   Acute delirium Active Problems:   FUO (fever of unknown origin)   Congestive dilated cardiomyopathy (HCC)   Goals of care, counseling/discussion   Palliative care by specialist   Elevated liver enzymes  67 year old male patient with history of atrial fibrillation, V. tach, status post AICD placement and follows with [facility] cardiology, patient also has diabetes mellitus type 2, CVA admitted for maculopapular rash and fever and AICD firing.  Patient had respiratory distress with tachycardia yesterday, transferred to ICU.   # acute renal failure patient has CKD stage III; slightly worse today. BUN 62-71-104-110 Creatinine 3.01-3.22  --3.0.  3.30 3.46-- Baseline creatinine at 1.3   monitor closely.Marland Kitchen  gentle hydration with IV fluids.   Avoid nephrotoxins  renal ultrasound with no Hydronephrosis Foley catheter  #Acute delirium with hallucinations could be from steroid flare discontinued steroids Psychiatry consult placed.  Seen by Dr. Weber Cooks who has recommended to discontinue Xanax and Haldol is added to the regimen   #Shock liver secondary to ischemic hepatitis Continue monitoring LFTs-trending down.  Ammonia level normal  abdominal ultrasound no ascites  GI  Following. Hepatitis panel  negative  f/u  HSV, Epstein-Barr virus and CMV  # .  Acute respiratory distress secondary to acute pulmonary edema: Received 40 mg of IV Lasix one-time, transferred to stepdown unit for BiPAP support, patient has history of severe dilated cardiopathy, history of V. tach, patient received amiodarone loading/infusion, c and by Dr. Clayborn Bigness from cardiology she needs AICD interrogation.  Patient clinically improved and discontinued amiodarone drip and changed to p.o. Amiodarone.  Patient got transferred to floor today 10/17/2017  #Thrombocytopenia could be from sepsis or heparin-induced Platelet count is slightly better today 54,000-74,000 HIT panel - pending Discontinued heparin No active bleeding or bruising  #.  Atrial fibrillation: Patient was on amiodarone drip, heparin drip, both are discontinued currently rate controlled.  Amiodarone changed to p.o.   #Sepsis met criteria with fever and tachycardia secondary to pneumonia and UTI  fever resolved  UTI, patient also had maculopapular rash s by Dr. Graylon Good from ID, recommended  Rocephin,  Blood cultures 1 out of 4 sets positive for staph species.  Will change antibiotics to p.o. omnicef which covers both pneumonia and UTI urineCulture showing more than 100,000 colonies of E. coli.  Blood cultures are no growth till date.  gentle hydration with IV fluids provided and nml lactic acid levels and elevated  procalcitonin Steroids and bronchodilators are added to the regimen for bronchospasm from underlying pneumonia  #Metabolic acidosis from sepsis, compensated with respiratory alkalosis gentle hydration with IV fluids provided and recheck lactic acid levels and procalcitonin   Acute on chronic systolic heart failure, BNP 705, cardiogram this time showed EF 10 to 15%.  status post AICD.  Continue, heparin drip because of V. tach episodes, Continue Entresto.  Patient on digoxin.    Patient is at high  risk for cardiac arrest with high mortality  rate Consult palliative care.  Anticipate discharge in ?days    .  all the records are reviewed and case discussed with Care Management/Social Workerr.  CODE STATUS:partial   and the patient girlfriend's daughter is the healthcare POA   TOTAL TIME TAKING CARE OF THIS PATIENT: 33 minutes.   Patient is too unstable for discharge planning.  Patient high risk for cardiac arrest in critically ill at this time,. Discussed with patient's  Daughter like person  patient has no family, daughter is in the process of getting healthcare power of attorney papers, she needs help with chaplain.  Nicholes Mango M.D on 10/22/2017 at 8:30 PM  Between 7am to 6pm - Pager - [phone]   After 6pm go to [email or web address] - password [email or web address] [facility]  [facility]  Office  [phone]  CC: Primary care physician; Phineas Inches, MD

## 2017-10-22 NOTE — Progress Notes (Signed)
[place], [place] 10/22/17  Subjective:  atient still quite confused. Renal function worse. Unclear as to what his true urine output is at the moment.   Objective:  Vital signs in last 24 hours:  Temp:  [97.5 F (36.4 C)-97.7 F (36.5 C)] 97.5 F (36.4 C) (07/30 0749) Pulse Rate:  [63-72] 63 (07/30 0749) Resp:  [14-18] 14 (07/30 0749) BP: (101-107)/(60-74) 104/60 (07/30 0749) SpO2:  [100 %] 100 % (07/30 0749) Weight:  [92 kg (202 lb 12.8 oz)] 92 kg (202 lb 12.8 oz) (07/30 0749)  Weight change:  Filed Weights   10/16/17 1155 10/19/17 0357 10/22/17 0749  Weight: 86.3 kg (190 lb 4.8 oz) 90.4 kg (199 lb 3.2 oz) 92 kg (202 lb 12.8 oz)    Intake/Output:    Intake/Output Summary (Last 24 hours) at 10/22/2017 1403 Last data filed at 10/22/2017 1000 Gross per 24 hour  Intake 665.33 ml  Output 1600 ml  Net -934.67 ml     Physical Exam: General:  No acute distress, laying in bed  HEENT  anicteric, moist oral mucous membranes  Neck:  Supple  Lungs:  Clear to auscultation bilaterally  Heart::  Irregular rhythm, no rub  Abdomen:  Soft, NTND BS present  Extremities:  2+ peripheral edema  Neurologic:  Awake but confused  Skin:  skin in LE covered this AM        Basic Metabolic Panel:  Recent Labs  Lab 10/18/17 1914 10/19/17 0627 10/20/17 0027 10/21/17 0935 10/22/17 0431  NA 133* 135 133* 132* 133*  K 4.2 4.1 3.9 3.8 4.1  CL 104 107 105 104 107  CO2 12* 14* 16* 17* 18*  GLUCOSE 164* 202* 233* 229* 200*  BUN 62* 71* 91* 104* 110*  CREATININE 3.01* 3.22* 3.02* 3.30* 3.46*  CALCIUM 7.6* 7.5* 7.5* 7.9* 8.0*     CBC: Recent Labs  Lab 10/15/17 2010  10/18/17 0847 10/19/17 0627 10/20/17 0027 10/21/17 0935 10/22/17 0431  WBC 8.0   < > 4.0 5.5 7.4 7.8 7.5  NEUTROABS 6.6*  --   --  4.6 6.5 6.9* 6.5  HGB 11.2*   < > 10.0* 10.8* 10.8* 10.2* 10.0*  HCT 33.2*   < > 29.2* 32.2* 31.8* 29.8* 28.7*  MCV 86.6   < > 86.3 86.5 84.4 84.7  84.3  PLT 113*   < > COUNT MAY BE INACCURATE DUE TO FIBRIN CLUMPS. 54* 74* 120* 125*   < > = values in this interval not displayed.      Lab Results  Component Value Date   HEPBSAG Negative 10/17/2017   HEPBIGM Negative 10/17/2017      Microbiology:  Recent Results (from the past 240 hour(s))  Blood Culture (routine x 2)     Status: Abnormal   Collection Time: 10/15/17  8:10 PM  Result Value Ref Range Status   Specimen Description   Final    BLOOD LEFT ANTECUBITAL Performed at [facility], [address] [place], [place] [address]    Special Requests   Final    BOTTLES DRAWN AEROBIC AND ANAEROBIC Blood Culture adequate volume Performed at [facility], [address] [place], [place] [address]    Culture  Setup Time   Final    AEROBIC BOTTLE ONLY GRAM POSITIVE COCCI CRITICAL RESULT CALLED TO, READ BACK BY AND VERIFIED WITH: SHEEMA HALLAJI 10/16/17 1437 KLW    Culture (A)  Final    STAPHYLOCOCCUS SPECIES (COAGULASE NEGATIVE) THE SIGNIFICANCE OF  ISOLATING THIS ORGANISM FROM A SINGLE SET OF BLOOD CULTURES WHEN MULTIPLE SETS ARE DRAWN IS UNCERTAIN. PLEASE NOTIFY THE MICROBIOLOGY DEPARTMENT WITHIN ONE WEEK IF SPECIATION AND SENSITIVITIES ARE REQUIRED. Performed at [facility], [place] [address] [place], [place] [address]    Report Status 10/18/2017 FINAL  Final  Blood Culture (routine x 2)     Status: None   Collection Time: 10/15/17  8:10 PM  Result Value Ref Range Status   Specimen Description BLOOD BLOOD RIGHT WRIST  Final   Special Requests   Final    BOTTLES DRAWN AEROBIC AND ANAEROBIC Blood Culture adequate volume   Culture   Final    NO GROWTH 5 DAYS Performed at [facility], [address] [place], [place] [address]    Report Status 10/20/2017 FINAL  Final  Blood Culture ID Panel (Reflexed)     Status: Abnormal   Collection Time: 10/15/17  8:10 PM  Result Value Ref Range Status   Enterococcus species NOT DETECTED NOT  DETECTED Final   Listeria monocytogenes NOT DETECTED NOT DETECTED Final   Staphylococcus species DETECTED (A) NOT DETECTED Final    Comment: Methicillin (oxacillin) susceptible coagulase negative staphylococcus. Possible blood culture contaminant (unless isolated from more than one blood culture draw or clinical case suggests pathogenicity). No antibiotic treatment is indicated for blood  culture contaminants. CRITICAL RESULT CALLED TO, READ BACK BY AND VERIFIED WITH: SHEEMA HALLAJI 10/16/17 1437 KLW    Staphylococcus aureus NOT DETECTED NOT DETECTED Final   Methicillin resistance NOT DETECTED NOT DETECTED Final   Streptococcus species NOT DETECTED NOT DETECTED Final   Streptococcus agalactiae NOT DETECTED NOT DETECTED Final   Streptococcus pneumoniae NOT DETECTED NOT DETECTED Final   Streptococcus pyogenes NOT DETECTED NOT DETECTED Final   Acinetobacter baumannii NOT DETECTED NOT DETECTED Final   Enterobacteriaceae species NOT DETECTED NOT DETECTED Final   Enterobacter cloacae complex NOT DETECTED NOT DETECTED Final   Escherichia coli NOT DETECTED NOT DETECTED Final   Klebsiella oxytoca NOT DETECTED NOT DETECTED Final   Klebsiella pneumoniae NOT DETECTED NOT DETECTED Final   Proteus species NOT DETECTED NOT DETECTED Final   Serratia marcescens NOT DETECTED NOT DETECTED Final   Haemophilus influenzae NOT DETECTED NOT DETECTED Final   Neisseria meningitidis NOT DETECTED NOT DETECTED Final   Pseudomonas aeruginosa NOT DETECTED NOT DETECTED Final   Candida albicans NOT DETECTED NOT DETECTED Final   Candida glabrata NOT DETECTED NOT DETECTED Final   Candida krusei NOT DETECTED NOT DETECTED Final   Candida parapsilosis NOT DETECTED NOT DETECTED Final   Candida tropicalis NOT DETECTED NOT DETECTED Final    Comment: Performed at [facility], [address] [place], [place] [address]  Urine culture     Status: Abnormal   Collection Time: 10/16/17  4:11 AM  Result Value Ref Range  Status   Specimen Description   Final    URINE, RANDOM Performed at [facility], [address] [place], [place] [address]    Special Requests   Final    NONE Performed at [facility], [address] [place], [place] [address]    Culture >=100,000 COLONIES/mL ESCHERICHIA COLI (A)  Final   Report Status 10/18/2017 FINAL  Final   Organism ID, Bacteria ESCHERICHIA COLI (A)  Final      Susceptibility   Escherichia coli - MIC*    AMPICILLIN 8 SENSITIVE Sensitive     CEFAZOLIN <=4 SENSITIVE Sensitive     CEFTRIAXONE <=1 SENSITIVE  Sensitive     CIPROFLOXACIN <=0.25 SENSITIVE Sensitive     GENTAMICIN <=1 SENSITIVE Sensitive     IMIPENEM <=0.25 SENSITIVE Sensitive     NITROFURANTOIN <=16 SENSITIVE Sensitive     TRIMETH/SULFA <=20 SENSITIVE Sensitive     AMPICILLIN/SULBACTAM 4 SENSITIVE Sensitive     PIP/TAZO <=4 SENSITIVE Sensitive     Extended ESBL NEGATIVE Sensitive     * >=100,000 COLONIES/mL ESCHERICHIA COLI  CULTURE, BLOOD (ROUTINE X 2) w Reflex to ID Panel     Status: None   Collection Time: 10/16/17  4:12 PM  Result Value Ref Range Status   Specimen Description BLOOD BLOOD LEFT HAND  Final   Special Requests   Final    BOTTLES DRAWN AEROBIC AND ANAEROBIC Blood Culture results may not be optimal due to an excessive volume of blood received in culture bottles   Culture   Final    NO GROWTH 5 DAYS Performed at [facility], [address] [place], [place] [address]    Report Status 10/21/2017 FINAL  Final  CULTURE, BLOOD (ROUTINE X 2) w Reflex to ID Panel     Status: None   Collection Time: 10/16/17  4:43 PM  Result Value Ref Range Status   Specimen Description BLOOD LEFT HAND  Final   Special Requests   Final    BOTTLES DRAWN AEROBIC AND ANAEROBIC Blood Culture results may not be optimal due to an inadequate volume of blood received in culture bottles   Culture   Final    NO GROWTH 5 DAYS Performed at [facility], [address] [place], [place] [address]    Report Status 10/21/2017 FINAL  Final  MRSA PCR Screening     Status: None   Collection Time: 10/16/17  5:01 PM  Result Value Ref Range Status   MRSA by PCR NEGATIVE NEGATIVE Final    Comment:        The GeneXpert MRSA Assay (FDA approved for NASAL specimens only), is one component of a comprehensive MRSA colonization surveillance program. It is not intended to diagnose MRSA infection nor to guide or monitor treatment for MRSA infections. Performed at [facility], [address] [place], [place] [address]     Coagulation Studies: No results for input(s): LABPROT, INR in the last 72 hours.  Urinalysis: No results for input(s): COLORURINE, LABSPEC, PHURINE, GLUCOSEU, HGBUR, BILIRUBINUR, KETONESUR, PROTEINUR, UROBILINOGEN, NITRITE, LEUKOCYTESUR in the last 72 hours.  Invalid input(s): APPERANCEUR    Imaging: US Renal  Result Date: 10/22/2017 CLINICAL DATA:  Acute renal failure EXAM: RENAL / URINARY TRACT ULTRASOUND COMPLETE COMPARISON:  10/18/2017 FINDINGS: Right Kidney: Length: 10.7 cm. Echogenicity within normal limits. No mass or hydronephrosis visualized. Left Kidney: Length: 11.5 cm. Echogenicity within normal limits. No mass or hydronephrosis visualized. Bladder: Appears normal for degree of bladder distention. IMPRESSION: Unremarkable renal ultrasound. Electronically Signed   By: Rolm Baptise M.D.   On: 10/22/2017 10:02     Medications:   . sodium chloride 40 mL/hr at 10/22/17 1148   . amiodarone  200 mg Oral Daily  . aspirin  81 mg Oral Daily  . cefdinir  300 mg Oral Daily  . chlorhexidine gluconate (MEDLINE KIT)  15 mL Mouth Rinse BID  . digoxin  125 mcg Oral QODAY  . famotidine  20 mg Oral Daily  . haloperidol lactate  1 mg Intravenous Q8H  . insulin aspart  0-15 Units Subcutaneous TID WC  . insulin aspart  5 Units  Subcutaneous TID WC  . insulin glargine  9 Units Subcutaneous Daily  . metoprolol tartrate  25 mg Oral  BID  . nitroGLYCERIN  0.3 mg Transdermal Daily   acetaminophen, albuterol, bisacodyl, docusate, ipratropium-albuterol, metoprolol tartrate, nitroGLYCERIN, [DISCONTINUED] ondansetron **OR** ondansetron (ZOFRAN) IV, senna-docusate, traZODone  Assessment/ Plan:  67 y.o. Caucasian male with medical problems of severe cardiomyopathy with LVEF less than 10% followed at [facility], atrial fibrillation, AICD diabetes, hypertension, history of stroke, CKD, who was admitted to [facility] on 10/15/2017 for evaluation of maculopapular rash over lower extremities and AICD firing.  Hospital course complicated by wide-complex tachycardia, severe respiratory distress, transferred to ICU, ventilator support.  Patient self extubated.  1.  Acute renal failure, chronic kidney disease stage III Likely ATN from cardiorenal syndrome, hypoperfusion Baseline creatinine 1.32 at admission/GFR 55 - renal function worse this a.m.  Continue IV fluid hydration with 0.9 normal saline at 40 cc per hour.  Renal ultrasound was performed and negative for hydronephrosis.  No urgent indication for dialysis however we may need to consider this if renal function continues to deteriorate.  2.  Urinary tract infection from E. coli Patient transitioned to Ceftin near 300 mg by mouth daily for UTI.  3.  Chronic systolic congestive heart failure with AICD Diuretics currently on hold.  We are suspecting an element of intravascular volume depletion therefore we started him on 0.9 normal saline at 40 cc per hour.  Monitor for signs of worsening heart failure.      LOS: 6 Linley Moskal 7/30/20192:03 PM  [place], [place]  Note: This note was prepared with Dragon dictation. Any transcription errors are unintentional

## 2017-10-22 NOTE — Progress Notes (Signed)
Jonathon Bellows , MD [address] [place], [place], [place], [address] 3940 [address], [place], [place], [place], [address] Phone: [phone]  Fax: [phone]   Derek Barnes is being followed for abnormal LFT's   Subjective: Patient very drowsy not able to give any history    Objective: Vital signs in last 24 hours: Vitals:   10/21/17 1712 10/21/17 2018 10/22/17 0327 10/22/17 0749  BP: 101/62 107/64 103/74 104/60  Pulse: 70 72 71 63  Resp: 18   14  Temp: 97.7 F (36.5 C) (!) 97.5 F (36.4 C)  (!) 97.5 F (36.4 C)  TempSrc: Oral Oral    SpO2: 100% 100% 100% 100%  Weight:    202 lb 12.8 oz (92 kg)  Height:       Weight change:   Intake/Output Summary (Last 24 hours) at 10/22/2017 1022 Last data filed at 10/22/2017 1000 Gross per 24 hour  Intake 665.33 ml  Output 1600 ml  Net -934.67 ml     Exam: Heart:: Regular rate and rhythm, S1S2 present or without murmur or extra heart sounds Lungs: normal, clear to auscultation and clear to auscultation and percussion Abdomen: soft, nontender, normal bowel sounds   Lab Results: Hepatic Function Latest Ref Rng & Units 10/22/2017 10/21/2017 10/20/2017  Total Protein 6.5 - 8.1 g/dL 5.2(L) 5.4(L) 5.4(L)  Albumin 3.5 - 5.0 g/dL 3.0(L) 2.9(L) 3.0(L)  AST 15 - 41 U/L 176(H) 273(H) 973(H)  ALT 0 - 44 U/L 526(H) 627(H) 915(H)  Alk Phosphatase 38 - 126 U/L 70 67 61  Total Bilirubin 0.3 - 1.2 mg/dL 2.0(H) 2.5(H) 3.7(H)  Bilirubin, Direct 0.0 - 0.2 mg/dL - - -    Micro Results: Recent Results (from the past 240 hour(s))  Blood Culture (routine x 2)     Status: Abnormal   Collection Time: 10/15/17  8:10 PM  Result Value Ref Range Status   Specimen Description   Final    BLOOD LEFT ANTECUBITAL Performed at [facility], [address] [place], [place] [address]    Special Requests   Final    BOTTLES DRAWN AEROBIC AND ANAEROBIC Blood Culture adequate volume Performed at [facility], [address]  [place], [place] [address]    Culture  Setup Time   Final    AEROBIC BOTTLE ONLY GRAM POSITIVE COCCI CRITICAL RESULT CALLED TO, READ BACK BY AND VERIFIED WITH: SHEEMA HALLAJI 10/16/17 1437 KLW    Culture (A)  Final    STAPHYLOCOCCUS SPECIES (COAGULASE NEGATIVE) THE SIGNIFICANCE OF ISOLATING THIS ORGANISM FROM A SINGLE SET OF BLOOD CULTURES WHEN MULTIPLE SETS ARE DRAWN IS UNCERTAIN. PLEASE NOTIFY THE MICROBIOLOGY DEPARTMENT WITHIN ONE WEEK IF SPECIATION AND SENSITIVITIES ARE REQUIRED. Performed at [facility], [place] [address] [place], [place] [address]    Report Status 10/18/2017 FINAL  Final  Blood Culture (routine x 2)     Status: None   Collection Time: 10/15/17  8:10 PM  Result Value Ref Range Status   Specimen Description BLOOD BLOOD RIGHT WRIST  Final   Special Requests   Final    BOTTLES DRAWN AEROBIC AND ANAEROBIC Blood Culture adequate volume   Culture   Final    NO GROWTH 5 DAYS Performed at [facility], [address], [place], [place] [address]    Report Status 10/20/2017 FINAL  Final  Blood Culture ID Panel (Reflexed)     Status: Abnormal   Collection Time: 10/15/17  8:10 PM  Result Value  Ref Range Status   Enterococcus species NOT DETECTED NOT DETECTED Final   Listeria monocytogenes NOT DETECTED NOT DETECTED Final   Staphylococcus species DETECTED (A) NOT DETECTED Final    Comment: Methicillin (oxacillin) susceptible coagulase negative staphylococcus. Possible blood culture contaminant (unless isolated from more than one blood culture draw or clinical case suggests pathogenicity). No antibiotic treatment is indicated for blood  culture contaminants. CRITICAL RESULT CALLED TO, READ BACK BY AND VERIFIED WITH: SHEEMA HALLAJI 10/16/17 1437 KLW    Staphylococcus aureus NOT DETECTED NOT DETECTED Final   Methicillin resistance NOT DETECTED NOT DETECTED Final   Streptococcus species NOT DETECTED NOT DETECTED Final   Streptococcus agalactiae NOT DETECTED NOT  DETECTED Final   Streptococcus pneumoniae NOT DETECTED NOT DETECTED Final   Streptococcus pyogenes NOT DETECTED NOT DETECTED Final   Acinetobacter baumannii NOT DETECTED NOT DETECTED Final   Enterobacteriaceae species NOT DETECTED NOT DETECTED Final   Enterobacter cloacae complex NOT DETECTED NOT DETECTED Final   Escherichia coli NOT DETECTED NOT DETECTED Final   Klebsiella oxytoca NOT DETECTED NOT DETECTED Final   Klebsiella pneumoniae NOT DETECTED NOT DETECTED Final   Proteus species NOT DETECTED NOT DETECTED Final   Serratia marcescens NOT DETECTED NOT DETECTED Final   Haemophilus influenzae NOT DETECTED NOT DETECTED Final   Neisseria meningitidis NOT DETECTED NOT DETECTED Final   Pseudomonas aeruginosa NOT DETECTED NOT DETECTED Final   Candida albicans NOT DETECTED NOT DETECTED Final   Candida glabrata NOT DETECTED NOT DETECTED Final   Candida krusei NOT DETECTED NOT DETECTED Final   Candida parapsilosis NOT DETECTED NOT DETECTED Final   Candida tropicalis NOT DETECTED NOT DETECTED Final    Comment: Performed at [facility], [address] [place], [place] [address]  Urine culture     Status: Abnormal   Collection Time: 10/16/17  4:11 AM  Result Value Ref Range Status   Specimen Description   Final    URINE, RANDOM Performed at [facility], [address] [place], [place] [address]    Special Requests   Final    NONE Performed at [facility], [address] [place], [place] [address]    Culture >=100,000 COLONIES/mL ESCHERICHIA COLI (A)  Final   Report Status 10/18/2017 FINAL  Final   Organism ID, Bacteria ESCHERICHIA COLI (A)  Final      Susceptibility   Escherichia coli - MIC*    AMPICILLIN 8 SENSITIVE Sensitive     CEFAZOLIN <=4 SENSITIVE Sensitive     CEFTRIAXONE <=1 SENSITIVE Sensitive     CIPROFLOXACIN <=0.25 SENSITIVE Sensitive     GENTAMICIN <=1 SENSITIVE Sensitive     IMIPENEM <=0.25 SENSITIVE Sensitive     NITROFURANTOIN <=16  SENSITIVE Sensitive     TRIMETH/SULFA <=20 SENSITIVE Sensitive     AMPICILLIN/SULBACTAM 4 SENSITIVE Sensitive     PIP/TAZO <=4 SENSITIVE Sensitive     Extended ESBL NEGATIVE Sensitive     * >=100,000 COLONIES/mL ESCHERICHIA COLI  CULTURE, BLOOD (ROUTINE X 2) w Reflex to ID Panel     Status: None   Collection Time: 10/16/17  4:12 PM  Result Value Ref Range Status   Specimen Description BLOOD BLOOD LEFT HAND  Final   Special Requests   Final    BOTTLES DRAWN AEROBIC AND ANAEROBIC Blood Culture results may not be optimal due to an excessive volume of blood received in culture bottles   Culture   Final    NO GROWTH 5 DAYS Performed at [facility], [address] [place], [place] [address]    Report Status 10/21/2017 FINAL  Final  CULTURE, BLOOD (ROUTINE X 2) w Reflex to ID Panel     Status: None   Collection Time: 10/16/17  4:43 PM  Result Value Ref Range Status   Specimen Description BLOOD LEFT HAND  Final   Special Requests   Final    BOTTLES DRAWN AEROBIC AND ANAEROBIC Blood Culture results may not be optimal due to an inadequate volume of blood received in culture bottles   Culture   Final    NO GROWTH 5 DAYS Performed at [facility], [address] [place], [place] [address]    Report Status 10/21/2017 FINAL  Final  MRSA PCR Screening     Status: None   Collection Time: 10/16/17  5:01 PM  Result Value Ref Range Status   MRSA by PCR NEGATIVE NEGATIVE Final    Comment:        The GeneXpert MRSA Assay (FDA approved for NASAL specimens only), is one component of a comprehensive MRSA colonization surveillance program. It is not intended to diagnose MRSA infection nor to guide or monitor treatment for MRSA infections. Performed at [facility], [address] [place], [place] [address]    Studies/Results: US Renal  Result Date: 10/22/2017 CLINICAL DATA:  Acute renal failure EXAM: RENAL / URINARY TRACT ULTRASOUND COMPLETE COMPARISON:   10/18/2017 FINDINGS: Right Kidney: Length: 10.7 cm. Echogenicity within normal limits. No mass or hydronephrosis visualized. Left Kidney: Length: 11.5 cm. Echogenicity within normal limits. No mass or hydronephrosis visualized. Bladder: Appears normal for degree of bladder distention. IMPRESSION: Unremarkable renal ultrasound. Electronically Signed   By: Rolm Baptise M.D.   On: 10/22/2017 10:02   Medications: I have reviewed the patient's current medications. Scheduled Meds: . amiodarone  200 mg Oral Daily  . aspirin  81 mg Oral Daily  . cefdinir  300 mg Oral Daily  . chlorhexidine gluconate (MEDLINE KIT)  15 mL Mouth Rinse BID  . digoxin  125 mcg Oral QODAY  . famotidine  20 mg Oral Daily  . insulin aspart  0-15 Units Subcutaneous TID WC  . insulin aspart  5 Units Subcutaneous TID WC  . insulin glargine  9 Units Subcutaneous Daily  . metoprolol tartrate  25 mg Oral BID  . nitroGLYCERIN  0.3 mg Transdermal Daily   Continuous Infusions: . sodium chloride 40 mL/hr at 10/21/17 1305   PRN Meds:.acetaminophen, albuterol, ALPRAZolam, bisacodyl, docusate, ipratropium-albuterol, metoprolol tartrate, nitroGLYCERIN, [DISCONTINUED] ondansetron **OR** ondansetron (ZOFRAN) IV, senna-docusate, traZODone   Assessment: Active Problems:   FUO (fever of unknown origin)   Congestive dilated cardiomyopathy (HCC)   Goals of care, counseling/discussion   Palliative care by specialist   Elevated liver enzymes  Derek Barnes admitted with fever, rash , AICD firing , respiratory failure, sepsis, AKI , Poor EF per echo. We have been consulted for elevated LFT's , they are rapidly returning back to normal indicating very likely secondary to ischemic hepatitis. Negative hepatitis panel.    Plan: 1. Continue supportive care. Ischemic hepatitis usually resolves with improvement in blood prerssure and cardiac status . I will check the INR today to ensure it is not significantly elevated as occasionally liver  failure may have a high INR with normal LFT's .  I will sign off.  Please call me if any further GI concerns or questions.  We would like to thank you for the opportunity to participate in the care of  Derek Barnes.    LOS: 6 days   Jonathon Bellows, MD 10/22/2017, 10:22 AM

## 2017-10-22 NOTE — Consult Note (Signed)
[facility] Face-to-Face Psychiatry Consult   Reason for Consult: Consult for this 67 year old man in the hospital with heart failure and multiple complications.  Concern about agitation and hallucinations. Referring Physician: Posey Pronto Patient Identification: Derek Barnes MRN:  [id number] Principal Diagnosis: Acute delirium Diagnosis:   Patient Active Problem List   Diagnosis Date Noted  . Acute delirium [R41.0] 10/22/2017  . 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 Goals of care, counseling/discussion [Z71.89]   . Palliative care by specialist [Z51.5]   . Elevated liver enzymes [R74.8]   . FUO (fever of unknown origin) [R50.9] 10/16/2017  . 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 Ventricular tachycardia ([place]) [I47.2] 03/30/2017  . Arrhythmia [I49.9]   . Elevated troponin [R74.8]   . 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 Rectal bleed [K62.5] 03/29/2017    Total Time spent with patient: 1 hour  Subjective:   Derek Barnes is a 67 y.o. male patient admitted with "could you hand me my pants?  I have a meeting in 10 minutes.".  HPI: Patient seen.  Chart reviewed.  This is a 67 year old man who is been in the hospital for about a week.  He came in from home with acute symptoms of his implanted defibrillator firing.  Had atrial fibrillation.  Has heart failure and has had several complications since.  Continues to have some worsening of his renal function and liver function.  For several days now the patient's mental status has been persistently confused.  Patient is noted to at times seemed to be hallucinating talking about people climbing in the windows.  At times this has gotten to where he has become agitated trying to get out of bed.  I met the patient and found him awake and conversant.  When I could hold his attention he was actually able to orient himself briefly.  He was able to tell me that he was in some sort of hospital and after  thinking about it could recognize that he was in [place].  He then was even able to describe to me correctly the events that led to his being in the hospital.  However this would all be interspersed with his telling me that he needed to go home because he had to pay his bills and 10 minutes, that he had to go to some sort of meeting regarding the wrestling team and saying things that just did not make any sense.  Patient was somewhat labile and his affect giggly but not angry.  Not agitated at least while I was in there with him.  Patient seemed in fact even have a little bit of insight saying at times he knows he has gotten confused although he has no awareness of having hallucinations.  He even told me that he suspects that the steroids may have had something to do with it.  Denies being depressed denies suicidal ideation.  Social history: Patient is a widower lives by himself evidently does not have any biological relatives who are actively involved.  He is a retired Firefighter.  Apparently some friends have been visiting him.  He talks a bit about a girlfriend but I am not able to really clarify anything about that.  Substance abuse history: Denies any no past history of it noted.  Medical history: Chronic congestive heart failure now with several other complicating factors.  History of stroke in the past as well.  Past Psychiatric History: No known past  psychiatric history.  He has no knowledge or memory of ever seeing a psychiatrist or mental health provider.  No history of suicidal behavior or substance abuse.  Does not know of ever being on any psychiatric medicine.  Risk to Self:   Risk to Others:   Prior Inpatient Therapy:   Prior Outpatient Therapy:    Past Medical History:  Past Medical History:  Diagnosis Date  . CHF (congestive heart failure) ([place])   . Chronic kidney disease (CKD)   . Colitis   . Decreased cardiac ejection fraction    EF 5-10%  . Dysrhythmia   .  Hypertension   . Liver abscess   . Myocardial infarct ([place])   . Nonischemic cardiomyopathy [facility])     Past Surgical History:  Procedure Laterality Date  . ACD DEFIBRILLATOR    . COLONOSCOPY    . COLONOSCOPY WITH PROPOFOL N/A 01/23/2017   Procedure: COLONOSCOPY WITH PROPOFOL;  Surgeon: Toledo, Benay Pike, MD;  Location: [facility] ENDOSCOPY;  Service: Gastroenterology;  Laterality: N/A;  . FRACTURE SURGERY    . HERNIA REPAIR    . INSERT / REPLACE / REMOVE PACEMAKER    . KNEE ARTHROSCOPY    . SHOULDER ARTHROSCOPY     Family History:  Family History  Problem Relation Age of Onset  . Cardiomyopathy Mother   . Prostate cancer Neg Hx   . Bladder Cancer Neg Hx   . Kidney cancer Neg Hx    Family Psychiatric  History: Patient does say that his father had mental health problems but when I tried to clarify what they were he told me that his father was "bored" Social History:  Social History   Substance and Sexual Activity  Alcohol Use No     Social History   Substance and Sexual Activity  Drug Use No    Social History   Socioeconomic History  . Marital status: Divorced    Spouse name: Not on file  . Number of children: Not on file  . Years of education: Not on file  . Highest education level: Not on file  Occupational History  . Not on file  Social Needs  . Financial resource strain: Not on file  . Food insecurity:    Worry: Not on file    Inability: Not on file  . Transportation needs:    Medical: Not on file    Non-medical: Not on file  Tobacco Use  . Smoking status: Never Smoker  . Smokeless tobacco: Never Used  Substance and Sexual Activity  . Alcohol use: No  . Drug use: No  . Sexual activity: Not Currently  Lifestyle  . Physical activity:    Days per week: Not on file    Minutes per session: Not on file  . Stress: Not on file  Relationships  . Social connections:    Talks on phone: Not on file    Gets together: Not on file    Attends religious service: Not on  file    Active member of club or organization: Not on file    Attends meetings of clubs or organizations: Not on file    Relationship status: Not on file  Other Topics Concern  . Not on file  Social History Narrative   Independent and active at baseline.   Additional Social History:    Allergies:   Allergies  Allergen Reactions  . Contrast Media [Iodinated Diagnostic Agents] Anaphylaxis  . Penicillins Anaphylaxis    Has patient had a PCN  reaction causing immediate rash, facial/tongue/throat swelling, SOB or lightheadedness with hypotension: Yes Has patient had a PCN reaction causing severe rash involving mucus membranes or skin necrosis: Yes Has patient had a PCN reaction that required hospitalization: No Has patient had a PCN reaction occurring within the last 10 years: No If all of the above answers are "NO", then may proceed with Cephalosporin use.   . Sotalol Other (See Comments)    Excessive fatigue    Labs:  Results for orders placed or performed during the hospital encounter of 10/15/17 (from the past 48 hour(s))  Glucose, capillary     Status: Abnormal   Collection Time: 10/20/17  5:07 PM  Result Value Ref Range   Glucose-Capillary 297 (H) 70 - 99 mg/dL  Glucose, capillary     Status: Abnormal   Collection Time: 10/20/17  9:03 PM  Result Value Ref Range   Glucose-Capillary 278 (H) 70 - 99 mg/dL  Glucose, capillary     Status: Abnormal   Collection Time: 10/21/17  8:09 AM  Result Value Ref Range   Glucose-Capillary 230 (H) 70 - 99 mg/dL  CBC with Differential/Platelet     Status: Abnormal   Collection Time: 10/21/17  9:35 AM  Result Value Ref Range   WBC 7.8 3.8 - 10.6 K/uL   RBC 3.52 (L) 4.40 - 5.90 MIL/uL   Hemoglobin 10.2 (L) 13.0 - 18.0 g/dL   HCT 29.8 (L) 40.0 - 52.0 %   MCV 84.7 80.0 - 100.0 fL   MCH 29.1 26.0 - 34.0 pg   MCHC 34.3 32.0 - 36.0 g/dL   RDW 16.7 (H) 11.5 - 14.5 %   Platelets 120 (L) 150 - 440 K/uL   Neutrophils Relative % 88 %   Neutro  Abs 6.9 (H) 1.4 - 6.5 K/uL   Lymphocytes Relative 5 %   Lymphs Abs 0.4 (L) 1.0 - 3.6 K/uL   Monocytes Relative 7 %   Monocytes Absolute 0.5 0.2 - 1.0 K/uL   Eosinophils Relative 0 %   Eosinophils Absolute 0.0 0 - 0.7 K/uL   Basophils Relative 0 %   Basophils Absolute 0.0 0 - 0.1 K/uL    Comment: Performed at [facility], [address] [place], [place] [address]  Comprehensive metabolic panel     Status: Abnormal   Collection Time: 10/21/17  9:35 AM  Result Value Ref Range   Sodium 132 (L) 135 - 145 mmol/L   Potassium 3.8 3.5 - 5.1 mmol/L   Chloride 104 98 - 111 mmol/L   CO2 17 (L) 22 - 32 mmol/L   Glucose, Bld 229 (H) 70 - 99 mg/dL   BUN 104 (H) 8 - 23 mg/dL    Comment: RESULT CONFIRMED BY MANUAL DILUTION DAS   Creatinine, Ser 3.30 (H) 0.61 - 1.24 mg/dL   Calcium 7.9 (L) 8.9 - 10.3 mg/dL   Total Protein 5.4 (L) 6.5 - 8.1 g/dL   Albumin 2.9 (L) 3.5 - 5.0 g/dL   AST 273 (H) 15 - 41 U/L   ALT 627 (H) 0 - 44 U/L   Alkaline Phosphatase 67 38 - 126 U/L   Total Bilirubin 2.5 (H) 0.3 - 1.2 mg/dL   GFR calc non Af Amer 18 (L) >60 mL/min   GFR calc Af Amer 21 (L) >60 mL/min    Comment: (NOTE) The eGFR has been calculated using the CKD EPI equation. This calculation has not been validated in all clinical situations. eGFR's persistently <60 mL/min signify possible  Chronic Kidney Disease.    Anion gap 11 5 - 15    Comment: Performed at [facility], [address] [place], [place] [address]  Ammonia     Status: Abnormal   Collection Time: 10/21/17  9:35 AM  Result Value Ref Range   Ammonia <9 (L) 9 - 35 umol/L    Comment: Performed at [facility], [address] [place], [place] [address]  Glucose, capillary     Status: Abnormal   Collection Time: 10/21/17 11:43 AM  Result Value Ref Range   Glucose-Capillary 304 (H) 70 - 99 mg/dL  Glucose, capillary     Status: Abnormal   Collection Time: 10/21/17  5:09 PM  Result Value Ref Range    Glucose-Capillary 186 (H) 70 - 99 mg/dL  Glucose, capillary     Status: Abnormal   Collection Time: 10/21/17  8:58 PM  Result Value Ref Range   Glucose-Capillary 227 (H) 70 - 99 mg/dL  CBC with Differential/Platelet     Status: Abnormal   Collection Time: 10/22/17  4:31 AM  Result Value Ref Range   WBC 7.5 3.8 - 10.6 K/uL   RBC 3.41 (L) 4.40 - 5.90 MIL/uL   Hemoglobin 10.0 (L) 13.0 - 18.0 g/dL   HCT 28.7 (L) 40.0 - 52.0 %   MCV 84.3 80.0 - 100.0 fL   MCH 29.2 26.0 - 34.0 pg   MCHC 34.6 32.0 - 36.0 g/dL   RDW 17.0 (H) 11.5 - 14.5 %   Platelets 125 (L) 150 - 440 K/uL   Neutrophils Relative % 86 %   Neutro Abs 6.5 1.4 - 6.5 K/uL   Lymphocytes Relative 5 %   Lymphs Abs 0.3 (L) 1.0 - 3.6 K/uL   Monocytes Relative 9 %   Monocytes Absolute 0.7 0.2 - 1.0 K/uL   Eosinophils Relative 0 %   Eosinophils Absolute 0.0 0 - 0.7 K/uL   Basophils Relative 0 %   Basophils Absolute 0.0 0 - 0.1 K/uL    Comment: Performed at [facility], [address] [place], [place] [address]  Comprehensive metabolic panel     Status: Abnormal   Collection Time: 10/22/17  4:31 AM  Result Value Ref Range   Sodium 133 (L) 135 - 145 mmol/L   Potassium 4.1 3.5 - 5.1 mmol/L   Chloride 107 98 - 111 mmol/L   CO2 18 (L) 22 - 32 mmol/L   Glucose, Bld 200 (H) 70 - 99 mg/dL   BUN 110 (H) 8 - 23 mg/dL    Comment: RESULT CONFIRMED BY MANUAL DILUTION SDR   Creatinine, Ser 3.46 (H) 0.61 - 1.24 mg/dL   Calcium 8.0 (L) 8.9 - 10.3 mg/dL   Total Protein 5.2 (L) 6.5 - 8.1 g/dL   Albumin 3.0 (L) 3.5 - 5.0 g/dL   AST 176 (H) 15 - 41 U/L   ALT 526 (H) 0 - 44 U/L   Alkaline Phosphatase 70 38 - 126 U/L   Total Bilirubin 2.0 (H) 0.3 - 1.2 mg/dL   GFR calc non Af Amer 17 (L) >60 mL/min   GFR calc Af Amer 20 (L) >60 mL/min    Comment: (NOTE) The eGFR has been calculated using the CKD EPI equation. This calculation has not been validated in all clinical situations. eGFR's persistently <60 mL/min signify possible  Chronic Kidney Disease.    Anion gap 8 5 - 15    Comment: Performed at [facility], [place],  [place] [address]  Glucose, capillary     Status: Abnormal   Collection Time: 10/22/17  7:48 AM  Result Value Ref Range   Glucose-Capillary 179 (H) 70 - 99 mg/dL   Comment 1 Notify RN   Glucose, capillary     Status: Abnormal   Collection Time: 10/22/17 11:27 AM  Result Value Ref Range   Glucose-Capillary 196 (H) 70 - 99 mg/dL   Comment 1 Notify RN     Current Facility-Administered Medications  Medication Dose Route Frequency Provider Last Rate Last Dose  . 0.9 %  sodium chloride infusion   Intravenous Continuous Lateef, [place], MD 40 mL/hr at 10/22/17 1148    . acetaminophen (TYLENOL) tablet 650 mg  650 mg Oral Q6H PRN Lance Coon, MD   650 mg at 10/21/17 0232  . albuterol (PROVENTIL) (2.5 MG/3ML) 0.083% nebulizer solution 2.5 mg  2.5 mg Nebulization Q4H PRN Gouru, Aruna, MD   2.5 mg at 10/18/17 1356  . amiodarone (PACERONE) tablet 200 mg  200 mg Oral Daily Wilhelmina Mcardle, MD   200 mg at 10/22/17 2878  . aspirin chewable tablet 81 mg  81 mg Oral Daily Wilhelmina Mcardle, MD   81 mg at 10/22/17 6767  . bisacodyl (DULCOLAX) suppository 10 mg  10 mg Rectal Daily PRN Wilhelmina Mcardle, MD      . cefdinir (OMNICEF) capsule 300 mg  300 mg Oral Daily Gouru, Aruna, MD   300 mg at 10/22/17 0810  . chlorhexidine gluconate (MEDLINE KIT) (PERIDEX) 0.12 % solution 15 mL  15 mL Mouth Rinse BID Awilda Bill, NP   15 mL at 10/19/17 2041  . digoxin (LANOXIN) tablet 125 mcg  125 mcg Oral Dub Mikes, MD   125 mcg at 10/21/17 2094  . docusate (COLACE) 50 MG/5ML liquid 100 mg  100 mg Per Tube BID PRN Wilhelmina Mcardle, MD      . famotidine (PEPCID) tablet 20 mg  20 mg Oral Daily Gouru, Aruna, MD   20 mg at 10/22/17 1001  . haloperidol lactate (HALDOL) injection 1 mg  1 mg Intravenous Q8H Clapacs, John T, MD      . insulin aspart (novoLOG) injection 0-15 Units  0-15 Units  Subcutaneous TID WC Gouru, Aruna, MD   3 Units at 10/22/17 1147  . insulin aspart (novoLOG) injection 5 Units  5 Units Subcutaneous TID WC Gouru, Aruna, MD   5 Units at 10/22/17 1147  . insulin glargine (LANTUS) injection 9 Units  9 Units Subcutaneous Daily Nicholes Mango, MD   9 Units at 10/22/17 0814  . ipratropium-albuterol (DUONEB) 0.5-2.5 (3) MG/3ML nebulizer solution 3 mL  3 mL Nebulization Q4H PRN Wilhelmina Mcardle, MD      . metoprolol tartrate (LOPRESSOR) injection 2.5 mg  2.5 mg Intravenous Q3H PRN Wilhelmina Mcardle, MD      . metoprolol tartrate (LOPRESSOR) tablet 25 mg  25 mg Oral BID Wilhelmina Mcardle, MD   25 mg at 10/22/17 7096  . nitroGLYCERIN (NITRODUR - Dosed in mg/24 hr) patch 0.3 mg  0.3 mg Transdermal Daily Wilhelmina Mcardle, MD   0.3 mg at 10/22/17 0813  . nitroGLYCERIN (NITROSTAT) SL tablet 0.4 mg  0.4 mg Sublingual Q5 min PRN Arta Silence, MD   0.4 mg at 10/18/17 0730  . ondansetron (ZOFRAN) injection 4 mg  4 mg Intravenous Q6H PRN Arta Silence, MD      . senna-docusate (Senokot-S) tablet 1 tablet  1 tablet Oral  QHS PRN Arta Silence, MD      . traZODone (DESYREL) tablet 50 mg  50 mg Oral QHS PRN Awilda Bill, NP   50 mg at 10/21/17 2218    Musculoskeletal: Strength & Muscle Tone: within normal limits Gait & Station: unable to stand Patient leans: N/A  Psychiatric Specialty Exam: Physical Exam  Nursing note and vitals reviewed. Constitutional: He appears well-developed and well-nourished.  HENT:  Head: Normocephalic and atraumatic.  Eyes: Pupils are equal, round, and reactive to light. Conjunctivae are normal.  Neck: Normal range of motion.  Cardiovascular: Regular rhythm and normal heart sounds.  Respiratory: Effort normal. No respiratory distress.  GI: Soft.  Musculoskeletal: Normal range of motion.  Neurological: He is alert.  Skin: Skin is warm and dry.  Psychiatric: His affect is labile and inappropriate. His speech is tangential. He is  actively hallucinating. Thought content is delusional. Cognition and memory are impaired. He expresses impulsivity. He expresses no homicidal and no suicidal ideation. He is inattentive.    Review of Systems  Constitutional: Negative.   HENT: Negative.   Eyes: Negative.   Respiratory: Negative.   Cardiovascular: Negative.   Gastrointestinal: Negative.   Musculoskeletal: Negative.   Skin: Negative.   Neurological: Negative.   Psychiatric/Behavioral: Negative.     Blood pressure 104/60, pulse 63, temperature (!) 97.5 F (36.4 C), resp. rate 14, height '5\' 8"'$  (1.727 m), weight 202 lb 12.8 oz (92 kg), SpO2 100 %.Body mass index is 30.84 kg/m.  General Appearance: Casual  Eye Contact:  Fair  Speech:  Garbled  Volume:  Decreased  Mood:  Euthymic  Affect:  Constricted and Inappropriate  Thought Process:  Disorganized  Orientation:  Full (Time, Place, and Person)  Thought Content:  Illogical, Rumination and Tangential  Suicidal Thoughts:  No  Homicidal Thoughts:  No  Memory:  Immediate;   Fair Recent;   Fair Remote;   Fair  Judgement:  Impaired  Insight:  Shallow  Psychomotor Activity:  Decreased  Concentration:  Concentration: Poor  Recall:  Poor  Fund of Knowledge:  Poor  Language:  Poor  Akathisia:  No  Handed:  Right  AIMS (if indicated):     Assets:  Desire for Improvement Housing  ADL's:  Impaired  Cognition:  Impaired,  Moderate  Sleep:        Treatment Plan Summary: Daily contact with patient to assess and evaluate symptoms and progress in treatment, Medication management and Plan 67 year old man with multiple medical problems is suffering from delirium.  Waxing and waning mental status with confusion and visual hallucinations agitation at times.  Current treatment with Xanax unlikely to help the problem.  Discontinued Xanax.  Orders completed to start Haldol 1 mg intravenous every 8 hours starting now.  Looked at his EKG.  There is some risk of increasing QT interval  but should be minimal with IV doses O.  Risk I believe outweighs potential benefits given the potential for injury from falls.  Would hope that he can be tapered off medication possibly by the time of discharge or soon thereafter.  I will continue to follow up as needed.  Also, I found the patient lying in a completely darkened room when I came in.  Although I understand the wish to have people sedated, staying in a darkened room all day is likely to worsen delirium and particularly worsened problems at night.  I opened his window shades and recommend that light and normal stimulation be continued during daylight hours  along with medication.  Disposition: No evidence of imminent risk to self or others at present.   Patient does not meet criteria for psychiatric inpatient admission. Supportive therapy provided about ongoing stressors. Discussed crisis plan, support from social network, calling 911, coming to the Emergency Department, and calling Suicide Hotline.  Alethia Berthold, MD 10/22/2017 11:54 AM

## 2017-10-22 NOTE — Progress Notes (Signed)
Report from [facility]. Patient sleeping. No distress. Will continue to monitor.

## 2017-10-22 NOTE — Plan of Care (Signed)

## 2017-10-22 NOTE — Progress Notes (Signed)
Palliative care  No charge note.  Chart reviewed. Patient now hallucinating and confused. Per previous conversation, patient wanted his deceased girlfriend's daughter to be his HCPOA. Spiritual care consult was ordered for HCPOA; however, it appears patient did not complete paperwork.   Palliative care will continue to follow for goals of care. Hopeful for improvement in mental status to discuss with patient.   Thank you for involving the palliative medicine team in this patient's care.  Gerlean Ren, DNP, AGNP-C Palliative Medicine Team Team Phone # [phone]  Pager # [phone]

## 2017-10-23 DIAGNOSIS — N179 Acute kidney failure, unspecified: Secondary | ICD-10-CM

## 2017-10-23 LAB — CBC WITH DIFFERENTIAL/PLATELET
BASOS ABS: 0 10*3/uL (ref 0–0.1)
BASOS PCT: 0 %
Eosinophils Absolute: 0 10*3/uL (ref 0–0.7)
Eosinophils Relative: 0 %
HCT: 31.1 % — ABNORMAL LOW (ref 40.0–52.0)
HEMOGLOBIN: 11 g/dL — AB (ref 13.0–18.0)
LYMPHS ABS: 0.6 10*3/uL — AB (ref 1.0–3.6)
LYMPHS PCT: 6 %
MCH: 29.9 pg (ref 26.0–34.0)
MCHC: 35.3 g/dL (ref 32.0–36.0)
MCV: 84.6 fL (ref 80.0–100.0)
Monocytes Absolute: 1.2 10*3/uL — ABNORMAL HIGH (ref 0.2–1.0)
Monocytes Relative: 12 %
NEUTROS ABS: 8.6 10*3/uL — AB (ref 1.4–6.5)
Neutrophils Relative %: 82 %
Platelets: 174 10*3/uL (ref 150–440)
RBC: 3.68 MIL/uL — AB (ref 4.40–5.90)
RDW: 17.5 % — ABNORMAL HIGH (ref 11.5–14.5)
WBC: 10.5 10*3/uL (ref 3.8–10.6)

## 2017-10-23 LAB — COMPREHENSIVE METABOLIC PANEL
ALBUMIN: 3.1 g/dL — AB (ref 3.5–5.0)
ALK PHOS: 67 U/L (ref 38–126)
ALT: 435 U/L — AB (ref 0–44)
AST: 99 U/L — ABNORMAL HIGH (ref 15–41)
Anion gap: 8 (ref 5–15)
BUN: 108 mg/dL — ABNORMAL HIGH (ref 8–23)
CHLORIDE: 112 mmol/L — AB (ref 98–111)
CO2: 20 mmol/L — ABNORMAL LOW (ref 22–32)
CREATININE: 3.24 mg/dL — AB (ref 0.61–1.24)
Calcium: 8.4 mg/dL — ABNORMAL LOW (ref 8.9–10.3)
GFR calc Af Amer: 22 mL/min — ABNORMAL LOW (ref >60)
GFR calc non Af Amer: 19 mL/min — ABNORMAL LOW (ref >60)
GLUCOSE: 164 mg/dL — AB (ref 70–99)
Potassium: 4.6 mmol/L (ref 3.5–5.1)
SODIUM: 140 mmol/L (ref 135–145)
Total Bilirubin: 2.3 mg/dL — ABNORMAL HIGH (ref 0.3–1.2)
Total Protein: 5.5 g/dL — ABNORMAL LOW (ref 6.5–8.1)

## 2017-10-23 LAB — GLUCOSE, CAPILLARY
GLUCOSE-CAPILLARY: 196 mg/dL — AB (ref 70–99)
GLUCOSE-CAPILLARY: 152 mg/dL — AB (ref 70–99)
Glucose-Capillary: 140 mg/dL — ABNORMAL HIGH (ref 70–99)
Glucose-Capillary: 233 mg/dL — ABNORMAL HIGH (ref 70–99)

## 2017-10-23 MED ORDER — HALOPERIDOL LACTATE 5 MG/ML IJ SOLN
0.5000 mg | Freq: Every day | INTRAMUSCULAR | Status: DC
  Administered 2017-10-23 – 2017-10-24 (×2): 0.5 mg via INTRAVENOUS
  Filled 2017-10-23 (×2): qty 1

## 2017-10-23 NOTE — Plan of Care (Signed)

## 2017-10-23 NOTE — Care Management (Addendum)
Creatinine 108.  Patient now with foley cath.  He continues with sx of delirium and verbal response to CM not always appropriate to question asked/ subject matter.  Informed that patient is refusing skilled and wonder if at this point if patient is fully capable  to make that decision.  In his current state, it is unlikely that patient would be able to manage his care alone.  Patient has improved functionally and recommendation is discharge home with home health. .  Lives alone but he says he has hired some one "to help him."  Discussed the need for patient to have supervision round the clock and he says this has been arranged. CM unable to confirm this. Spoke with Dr Toni Amend regarding capacity

## 2017-10-23 NOTE — Progress Notes (Signed)
Daily Progress Note   Patient Name: Derek Barnes       Date: 10/23/2017 DOB: 22-Jun-1950  Age: 67 y.o. MRN#: [id number] Attending Physician: Nicholes Mango, MD Primary Care Physician: Phineas Inches, MD Admit Date: 10/15/2017  Reason for Consultation/Follow-up: Establishing goals of care  Subjective: Denies pain, continue to states he wants to go home. Seems a bit more oriented, however some statements still do not make sense. Per nursing, still periods of confusion and hallucinations.   Length of Stay: 7  Current Medications: Scheduled Meds:  . amiodarone  200 mg Oral Daily  . aspirin  81 mg Oral Daily  . cefdinir  300 mg Oral Daily  . chlorhexidine gluconate (MEDLINE KIT)  15 mL Mouth Rinse BID  . digoxin  125 mcg Oral QODAY  . famotidine  20 mg Oral Daily  . haloperidol lactate  1 mg Intravenous Q8H  . insulin aspart  0-15 Units Subcutaneous TID WC  . insulin aspart  5 Units Subcutaneous TID WC  . insulin glargine  9 Units Subcutaneous Daily  . metoprolol succinate  50 mg Oral Daily  . nitroGLYCERIN  0.3 mg Transdermal Daily    Continuous Infusions: . sodium chloride 40 mL/hr at 10/22/17 2000    PRN Meds: acetaminophen, albuterol, bisacodyl, docusate, ipratropium-albuterol, metoprolol tartrate, nitroGLYCERIN, [DISCONTINUED] ondansetron **OR** ondansetron (ZOFRAN) IV, senna-docusate, traZODone  Physical Exam  Constitutional: He is oriented to person, place, and time. He appears well-nourished. No distress.  HENT:  Head: Normocephalic and atraumatic.  Cardiovascular: Normal rate.  Pulmonary/Chest: Effort normal. No respiratory distress.  Abdominal: Soft.  Neurological: He is alert and oriented to person, place, and time.  Periods of confusion  Skin: Skin is warm and dry. He is not  diaphoretic.            Vital Signs: BP 103/65 (BP Location: Right Arm)   Pulse 71   Temp (!) 97.5 F (36.4 C) (Oral)   Resp 20   Ht '5\' 8"'$  (1.727 m)   Wt 204 lb 9.6 oz (92.8 kg)   SpO2 99%   BMI 31.11 kg/m  SpO2: SpO2: 99 % O2 Device: O2 Device: Room Air O2 Flow Rate: O2 Flow Rate (L/min): 1.5 L/min  Intake/output summary:   Intake/Output Summary (Last 24 hours) at 10/23/2017 1145 Last data filed at 10/23/2017 0500 Gross per 24 hour  Intake 771.33 ml  Output 1300 ml  Net -528.67 ml   LBM: Last BM Date: 10/21/17 Baseline Weight: Weight: 200 lb (90.7 kg) Most recent weight: Weight: 204 lb 9.6 oz (92.8 kg)       Palliative Assessment/Data: PPS 60%    Flowsheet Rows     Most Recent Value  Intake Tab  Referral Department  Hospitalist  Unit at Time of Referral  ICU  Palliative Care Primary Diagnosis  Cardiac  Date Notified  10/17/17  Palliative Care Type  New Palliative care  Reason for referral  Clarify Goals of Care  Date of Admission  10/15/17  Date first seen by Palliative Care  10/18/17  # of days Palliative referral response time  1 Day(s)  # of days IP prior to Palliative referral  2  Clinical Assessment  Palliative Performance Scale Score  80%  Psychosocial & Spiritual Assessment  Palliative Care Outcomes  Patient/Family meeting held?  Yes  Who was at the meeting?  -- [patient]  Palliative Care Outcomes  Clarified goals of care, Counseled regarding hospice, Provided advance care planning, Provided psychosocial or spiritual support, Changed CPR status, Linked to palliative care logitudinal support  Palliative Care follow-up planned  Yes, Home  Palliative Care Follow-up Reason  Clarify goals of care      Patient Active Problem List   Diagnosis Date Noted  . Acute delirium 10/22/2017  . Congestive dilated cardiomyopathy ([place])   . Goals of care, counseling/discussion   . Palliative care by specialist   . Elevated liver enzymes   . FUO (fever of unknown  origin) 10/16/2017  . Stroke (cerebrum) ([place]) 04/01/2017  . Ventricular tachycardia ([place]) 03/30/2017  . Arrhythmia   . Elevated troponin   . Chronic combined systolic and diastolic heart failure ([place])   . Rectal bleed 03/29/2017    Palliative Care Assessment & Plan   HPI: 67 y.o. male  with past medical history of severe cardiomyopathy (LVEF 20-25%), afib, VTach s/p AICD, T2DM, HTN, CVA, and CKD admitted on 10/15/2017 with fever, rash and AICD firing. Was transferred to ICU d/t wide-complex tachycardia and severe respiratory distress. Failed bipap and intubated 7/24. Found to have pulmonary edema. Echocardiogram revealed worsening EF at 10-15%. On 7/25 patient self-extubated and tolerated surprisingly well. PMT consulted by hospitalist for [place].   Assessment: Follow up meeting with patient. Although his mental status is questionable, throughout our conversation he appears to follow - he remembers me from our meeting on Friday telling me I'm "the lady that asks hard questions". We discussed his clinical course. He is focused on going home. We talked about the obstacles to him going home and he shares "my friends will take care of me".  Discussed HCPOA paperwork - he tells me he does not need to complete it because Benjamine Mola has a copy naming her as HCPOA. We also discussed dialysis if his kidneys did not improve - he tells me this is hard to think about and he is not ready to decide - would want to make that decision later if he were actually faced with it.  Called and spoke with Sheran Fava - she is the patient's deceased girlfriend's daughter. Patient has stated multiple times he would want her to be his decision maker. Benjamine Mola shares that she does not have HCPOA paperwork. However, since patient does not have any family - no spouse, children, or siblings - his decision maker would legally be "an individual with an established relationship with the patient who is acting in good faith and can  reliably convey the wishes of the patient". Benjamine Mola tells me she is currently our of the country until Saturday 8/3. She tells me if there is an emergency she would like [place] to call her ex-husband [facility] at [phone]. I asked her about the patient's friend Ronalee Belts who is listed in the patient's contacts. She tells me that Ronalee Belts is a good friend but should not be allowed to make decisions. She feels as though he is "unstable". I shared all of this with nursing staff.  I shared my concerns with Benjamine Mola about the patient's renal and cardiac function. We also discussed the patient's weakness and potential need for rehab. She understands my concerns. She is hopeful for continued improvement in patient's mental status so that he can make decisions for himself.  Recommendations/Plan:  No HCPOA documented - however Sheran Fava is who patient would want to make decisions for him and this is acceptable legally as patient has no family to speak for him - see above  Discussed clinical course with patient and Benjamine Mola - discussed potential decisions they may be faced with  At minimum, patient should be followed by outpatient palliative  Per previous discussion with patient on Friday prior to delirium: - patient does not want CPR, would be okay with short-term ventilation, no long-term or trach -plans to discuss his care with cardiologist and what options are available to him, would be open to hospice if he is not a candidate for any further treatment  Goals of Care and Additional Recommendations:  Limitations on Scope of Treatment: Full Scope Treatment  Code Status:  Limited code - no CPR  Prognosis:   Unable to determine - poor prognosis r/t cardiomyopathy and renal impairment  Discharge Planning:  To Be Determined  Care plan was discussed with Sheran Fava and RN  Thank you for allowing the Palliative Medicine Team to assist in the care of this patient.   Total  Time 1345-1500 75 minutes Prolonged Time Billed  yes      Greater than 50%  of this time was spent counseling and coordinating care related to the above assessment and plan.  Juel Burrow, DNP, [facility] Palliative Medicine Team Team Phone # [phone]  Pager [phone]

## 2017-10-23 NOTE — Progress Notes (Signed)
[place] [facility] [place] NAME: Li Fragoso    MR#:  [id number]  DATE OF BIRTH:  1950/12/06  SUBJECTIVE: Admitted to telemetry initially but had respiratory distress, wide-complex tachycardia  transferred to intensive care unit.  Patient respiratory status better, denies chest pain.  On amiodarone drip, phenylephrine for hypotension.  Discontinued amiodarone drip when patient got transferred to telemetry bed on 10/17/2017.   CHIEF COMPLAINT:   Chief Complaint  Patient presents with  . Shortness of Breath  . Nausea  Patient is less confused today  palliative care is involved,he does not have any family members as reported by the friend.  Girlfriend died recently  REVIEW OF SYSTEMS:    Review of Systems  Constitutional: Negative for chills and fever.  HENT: Negative for hearing loss.   Eyes: Negative for blurred vision, double vision and photophobia.  Respiratory: Negative for cough, hemoptysis and shortness of breath.   Cardiovascular: Negative for palpitations, orthopnea and leg swelling.  Gastrointestinal: Negative for abdominal pain, diarrhea and vomiting.  Genitourinary: Negative for dysuria and urgency.  Musculoskeletal: Negative for myalgias and neck pain.  Skin: Negative for rash.  Neurological: Negative for dizziness, focal weakness, seizures, weakness and headaches.  Psychiatric/Behavioral: Negative for memory loss. The patient does not have insomnia.    Nutrition:  Tolerating Diet: Tolerating PT:      DRUG ALLERGIES:   Allergies  Allergen Reactions  . Contrast Media [Iodinated Diagnostic Agents] Anaphylaxis  . Penicillins Anaphylaxis    Has patient had a PCN reaction causing immediate rash, facial/tongue/throat swelling, SOB or lightheadedness with hypotension: Yes Has patient had a PCN reaction causing severe rash involving mucus membranes or skin necrosis: Yes Has patient had a PCN reaction that required  hospitalization: No Has patient had a PCN reaction occurring within the last 10 years: No If all of the above answers are "NO", then may proceed with Cephalosporin use.   . Sotalol Other (See Comments)    Excessive fatigue    VITALS:  Blood pressure 103/65, pulse 71, temperature (!) 97.5 F (36.4 C), temperature source Oral, resp. rate 20, height '5\' 8"'$  (1.727 m), weight 92.8 kg (204 lb 9.6 oz), SpO2 99 %.  PHYSICAL EXAMINATION:   Physical Exam  GENERAL:  67 y.o.-year-old patient lying in the bed with obviousrespiratory distress. EYES: Pupils equal, round, reactive to light and accommodation. No scleral icterus.  HEENT: Head atraumatic, normocephalic. Oropharynx and nasopharynx clear.  NECK:  Supple, no jugular venous distention. No thyroid enlargement, no tenderness.  LUNGS: Bibasilar crepitations.  Marland Kitchen  CARDIOVASCULAR:  S1, S2  Irregular. .  ABDOMEN: Soft, nontender, nondistended. Bowel sounds present. No organomegaly or mass.  EXTREMITIES:trace pedal edema..  Cyanosis, or clubbing.  NEUROLOGIC, awake, oriented.  Cranial nerves II through XII intact, 5/5 upper and lowerextremities/.  Sensations are intact.  DTRs 2+ bilaterally.   PSYCHIATRIC: The patient is alert and oriented x 3.  During my visit SKIN: No obvious rash, lesion, or ulcer.    LABORATORY PANEL:   CBC Recent Labs  Lab 10/23/17 0453  WBC 10.5  HGB 11.0*  HCT 31.1*  PLT 174   ------------------------------------------------------------------------------------------------------------------  Chemistries  Recent Labs  Lab 10/23/17 0453  NA 140  K 4.6  CL 112*  CO2 20*  GLUCOSE 164*  BUN 108*  CREATININE 3.24*  CALCIUM 8.4*  AST 99*  ALT 435*  ALKPHOS 67  BILITOT 2.3*   ------------------------------------------------------------------------------------------------------------------  Cardiac Enzymes Recent Labs  Lab 10/17/17  9147  TROPONINI 1.58*    ------------------------------------------------------------------------------------------------------------------  RADIOLOGY:  US Renal  Result Date: 10/22/2017 CLINICAL DATA:  Acute renal failure EXAM: RENAL / URINARY TRACT ULTRASOUND COMPLETE COMPARISON:  10/18/2017 FINDINGS: Right Kidney: Length: 10.7 cm. Echogenicity within normal limits. No mass or hydronephrosis visualized. Left Kidney: Length: 11.5 cm. Echogenicity within normal limits. No mass or hydronephrosis visualized. Bladder: Appears normal for degree of bladder distention. IMPRESSION: Unremarkable renal ultrasound. Electronically Signed   By: Rolm Baptise M.D.   On: 10/22/2017 10:02     ASSESSMENT AND PLAN:   Principal Problem:   Acute delirium Active Problems:   FUO (fever of unknown origin)   Congestive dilated cardiomyopathy (H[facility])   Goals of care, counseling/discussion   Palliative care by specialist   Elevated liver enzymes  67 year old male patient with history of atrial fibrillation, V. tach, status post AICD placement and follows with [facility] cardiology, patient also has diabetes mellitus type 2, CVA admitted for maculopapular rash and fever and AICD firing.  Patient had respiratory distress with tachycardia yesterday, transferred to ICU.   # acute renal failure patient has CKD stage III; slightly worse today. BUN 62-71-104-110 Creatinine 3.01-3.22  --3.0.  3.30 3.46--3.23-- Baseline creatinine at 1.3   monitor closely.Marland Kitchen   gentle hydration with IV fluids will be continued 1 more day watch for symptoms and signs of fluid overload as patient's ejection fraction is 10 to 15% Avoid nephrotoxins  renal ultrasound with no Hydronephrosis Foley catheter  #Acute delirium with hallucinations could be from steroid flare discontinued steroids Psychiatry consult placed.  Seen by Dr. Weber Cooks who has recommended to discontinue Xanax and Haldol is added to the regimen   #Shock liver secondary to ischemic hepatitis Continue  monitoring LFTs-trending down.  Ammonia level normal  abdominal ultrasound no ascites  GI  Following. Hepatitis panel negative  f/u  HSV, Epstein-Barr virus and CMV  # .  Acute respiratory distress secondary to acute pulmonary edema: Received 40 mg of IV Lasix one-time, transferred to stepdown unit for BiPAP support, patient has history of severe dilated cardiopathy, history of V. tach, patient received amiodarone loading/infusion, c and by Dr. Clayborn Bigness from cardiology she needs AICD interrogation.  Patient clinically improved and discontinued amiodarone drip and changed to p.o. Amiodarone.  Patient got transferred to floor today 10/17/2017  #Thrombocytopenia could be from sepsis  Resolved Platelet count is  better today 54,000-74,000--1 74,000 No active bleeding or bruising  #.  Atrial fibrillation: Patient was on amiodarone drip, heparin drip, both are discontinued currently rate controlled.  Amiodarone changed to p.o.   #Sepsis met criteria with fever and tachycardia secondary to pneumonia and UTI  fever resolved  UTI, patient also had maculopapular rash s by Dr. Graylon Good from ID, recommended  Rocephin,  Blood cultures 1 out of 4 sets positive for staph species.  Will change antibiotics to p.o. omnicef which covers both pneumonia and UTI urineCulture showing more than 100,000 colonies of E. coli.  Blood cultures are no growth till date.  nml lactic acid levels and elevated  procalcitonin   #Metabolic acidosis from sepsis, compensated with respiratory alkalosis gentle hydration with IV fluids provided and recheck lactic acid levels and procalcitonin   Acute on chronic systolic heart failure, BNP 705, cardiogram this time showed EF 10 to 15%.  status post AICD.  Continue, heparin drip because of V. tach episodes, Continue Entresto.  Patient on digoxin.    Patient is at high risk for cardiac arrest with high mortality rate Consult  palliative care.  Anticipate discharge in ?days    .   all the records are reviewed and case discussed with Care Management/Social Workerr.  CODE STATUS:partial   and the patient girlfriend's daughter is the healthcare POA   TOTAL TIME TAKING CARE OF THIS PATIENT: 33 minutes.   Patient is too unstable for discharge planning.  Patient high risk for cardiac arrest in critically ill at this time,. Discussed with patient's  Daughter like person  patient has no family, daughter is in the process of getting healthcare power of attorney papers, she needs help with chaplain.  Nicholes Mango M.D on 10/23/2017 at 12:36 PM  Between 7am to 6pm - Pager - [phone]   After 6pm go to [email or web address] - password [email or web address] [facility]  [facility]  Office  [phone]  CC: Primary care physician; Phineas Inches, MD

## 2017-10-23 NOTE — Consult Note (Signed)
[facility] Consult   Reason for Consult: Follow-up consult for this 67 year old man who had delirium when recovering from his acute incident. Referring Physician: Sudini Patient Identification: Derek Barnes MRN:  [id number] Principal Diagnosis: Acute delirium Diagnosis:   Patient Active Problem List   Diagnosis Date Noted  . Acute renal failure ([place]) [N17.9]   . Acute delirium [R41.0] 10/22/2017  . 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 Goals of care, counseling/discussion [Z71.89]   . Palliative care by specialist [Z51.5]   . Elevated liver enzymes [R74.8]   . FUO (fever of unknown origin) [R50.9] 10/16/2017  . 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 Ventricular tachycardia ([place]) [I47.2] 03/30/2017  . Arrhythmia [I49.9]   . Elevated troponin [R74.8]   . 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 Rectal bleed [K62.5] 03/29/2017    Total Time spent with patient: 30 minutes  Subjective:   Derek Barnes is a 67 y.o. male patient admitted with "I am doing better".  HPI: See previous note.  This 67 year old man came into the hospital with acute cardiac event.  He was delirious and confused and hallucinating.  I put him on some standing Haldol.  Today on evaluation the patient is awake and appropriately interactive.  He is fully oriented to his situation.  Short-term memory is much improved.  Affect is smiling and more upbeat.  Mood is stated as good.  Patient is very fixated on the idea that he needs to get out of the hospital to "take care of my business".  As I mentioned he does recognize that he has had a serious medical problem.  Also recognizes he is going to need some help at home but thinks is going to be fine if his "lady friends" come in and help to take care of him.  No indication of any suicidal or homicidal ideation.  No acute psychosis.  No side effects from the Haldol.  Past Psychiatric History: No major  past psychiatric illness that I can tell  Risk to Self:   Risk to Others:   Prior Inpatient Therapy:   Prior Outpatient Therapy:    Past Medical History:  Past Medical History:  Diagnosis Date  . CHF (congestive heart failure) ([place])   . Chronic kidney disease (CKD)   . Colitis   . Decreased cardiac ejection fraction    EF 5-10%  . Dysrhythmia   . Hypertension   . Liver abscess   . Myocardial infarct ([place])   . Nonischemic cardiomyopathy [facility])     Past Surgical History:  Procedure Laterality Date  . ACD DEFIBRILLATOR    . COLONOSCOPY    . COLONOSCOPY WITH PROPOFOL N/A 01/23/2017   Procedure: COLONOSCOPY WITH PROPOFOL;  Surgeon: Toledo, Benay Pike, MD;  Location: [facility] ENDOSCOPY;  Service: Gastroenterology;  Laterality: N/A;  . FRACTURE SURGERY    . HERNIA REPAIR    . INSERT / REPLACE / REMOVE PACEMAKER    . KNEE ARTHROSCOPY    . SHOULDER ARTHROSCOPY     Family History:  Family History  Problem Relation Age of Onset  . Cardiomyopathy Mother   . Prostate cancer Neg Hx   . Bladder Cancer Neg Hx   . Kidney cancer Neg Hx    Family Psychiatric  History: See previous note Social History:  Social History   Substance and Sexual Activity  Alcohol Use No     Social History   Substance and Sexual Activity  Drug  Use No    Social History   Socioeconomic History  . Marital status: Divorced    Spouse name: Not on file  . Number of children: Not on file  . Years of education: Not on file  . Highest education level: Not on file  Occupational History  . Not on file  Social Needs  . Financial resource strain: Not on file  . Food insecurity:    Worry: Not on file    Inability: Not on file  . Transportation needs:    Medical: Not on file    Non-medical: Not on file  Tobacco Use  . Smoking status: Never Smoker  . Smokeless tobacco: Never Used  Substance and Sexual Activity  . Alcohol use: No  . Drug use: No  . Sexual activity: Not Currently  Lifestyle  . Physical  activity:    Days per week: Not on file    Minutes per session: Not on file  . Stress: Not on file  Relationships  . Social connections:    Talks on phone: Not on file    Gets together: Not on file    Attends religious service: Not on file    Active member of club or organization: Not on file    Attends meetings of clubs or organizations: Not on file    Relationship status: Not on file  Other Topics Concern  . Not on file  Social History Narrative   Independent and active at baseline.   Additional Social History:    Allergies:   Allergies  Allergen Reactions  . Contrast Media [Iodinated Diagnostic Agents] Anaphylaxis  . Penicillins Anaphylaxis    Has patient had a PCN reaction causing immediate rash, facial/tongue/throat swelling, SOB or lightheadedness with hypotension: Yes Has patient had a PCN reaction causing severe rash involving mucus membranes or skin necrosis: Yes Has patient had a PCN reaction that required hospitalization: No Has patient had a PCN reaction occurring within the last 10 years: No If all of the above answers are "NO", then may proceed with Cephalosporin use.   . Sotalol Other (See Comments)    Excessive fatigue    Labs:  Results for orders placed or performed during the hospital encounter of 10/15/17 (from the past 48 hour(s))  Glucose, capillary     Status: Abnormal   Collection Time: 10/21/17  8:58 PM  Result Value Ref Range   Glucose-Capillary 227 (H) 70 - 99 mg/dL  CBC with Differential/Platelet     Status: Abnormal   Collection Time: 10/22/17  4:31 AM  Result Value Ref Range   WBC 7.5 3.8 - 10.6 K/uL   RBC 3.41 (L) 4.40 - 5.90 MIL/uL   Hemoglobin 10.0 (L) 13.0 - 18.0 g/dL   HCT 28.7 (L) 40.0 - 52.0 %   MCV 84.3 80.0 - 100.0 fL   MCH 29.2 26.0 - 34.0 pg   MCHC 34.6 32.0 - 36.0 g/dL   RDW 17.0 (H) 11.5 - 14.5 %   Platelets 125 (L) 150 - 440 K/uL   Neutrophils Relative % 86 %   Neutro Abs 6.5 1.4 - 6.5 K/uL   Lymphocytes Relative 5 %    Lymphs Abs 0.3 (L) 1.0 - 3.6 K/uL   Monocytes Relative 9 %   Monocytes Absolute 0.7 0.2 - 1.0 K/uL   Eosinophils Relative 0 %   Eosinophils Absolute 0.0 0 - 0.7 K/uL   Basophils Relative 0 %   Basophils Absolute 0.0 0 - 0.1 K/uL  Comment: Performed at [facility], [address] [place], [place] [address]  Comprehensive metabolic panel     Status: Abnormal   Collection Time: 10/22/17  4:31 AM  Result Value Ref Range   Sodium 133 (L) 135 - 145 mmol/L   Potassium 4.1 3.5 - 5.1 mmol/L   Chloride 107 98 - 111 mmol/L   CO2 18 (L) 22 - 32 mmol/L   Glucose, Bld 200 (H) 70 - 99 mg/dL   BUN 110 (H) 8 - 23 mg/dL    Comment: RESULT CONFIRMED BY MANUAL DILUTION SDR   Creatinine, Ser 3.46 (H) 0.61 - 1.24 mg/dL   Calcium 8.0 (L) 8.9 - 10.3 mg/dL   Total Protein 5.2 (L) 6.5 - 8.1 g/dL   Albumin 3.0 (L) 3.5 - 5.0 g/dL   AST 176 (H) 15 - 41 U/L   ALT 526 (H) 0 - 44 U/L   Alkaline Phosphatase 70 38 - 126 U/L   Total Bilirubin 2.0 (H) 0.3 - 1.2 mg/dL   GFR calc non Af Amer 17 (L) >60 mL/min   GFR calc Af Amer 20 (L) >60 mL/min    Comment: (NOTE) The eGFR has been calculated using the CKD EPI equation. This calculation has not been validated in all clinical situations. eGFR's persistently <60 mL/min signify possible Chronic Kidney Disease.    Anion gap 8 5 - 15    Comment: Performed at [facility], [address] [place], [place] [address]  Glucose, capillary     Status: Abnormal   Collection Time: 10/22/17  7:48 AM  Result Value Ref Range   Glucose-Capillary 179 (H) 70 - 99 mg/dL   Comment 1 Notify RN   Glucose, capillary     Status: Abnormal   Collection Time: 10/22/17 11:27 AM  Result Value Ref Range   Glucose-Capillary 196 (H) 70 - 99 mg/dL   Comment 1 Notify RN   Glucose, capillary     Status: Abnormal   Collection Time: 10/22/17  4:20 PM  Result Value Ref Range   Glucose-Capillary 129 (H) 70 - 99 mg/dL   Comment 1 Notify RN   Protime-INR     Status:  Abnormal   Collection Time: 10/22/17  4:54 PM  Result Value Ref Range   Prothrombin Time 16.9 (H) 11.4 - 15.2 seconds   INR 1.39     Comment: Performed at [facility], [address] [place],  [address]  Glucose, capillary     Status: Abnormal   Collection Time: 10/22/17  9:15 PM  Result Value Ref Range   Glucose-Capillary 134 (H) 70 - 99 mg/dL   Comment 1 Notify RN    Comment 2 Document in Chart   CBC with Differential/Platelet     Status: Abnormal   Collection Time: 10/23/17  4:53 AM  Result Value Ref Range   WBC 10.5 3.8 - 10.6 K/uL   RBC 3.68 (L) 4.40 - 5.90 MIL/uL   Hemoglobin 11.0 (L) 13.0 - 18.0 g/dL   HCT 31.1 (L) 40.0 - 52.0 %   MCV 84.6 80.0 - 100.0 fL   MCH 29.9 26.0 - 34.0 pg   MCHC 35.3 32.0 - 36.0 g/dL   RDW 17.5 (H) 11.5 - 14.5 %   Platelets 174 150 - 440 K/uL   Neutrophils Relative % 82 %   Neutro Abs 8.6 (H) 1.4 - 6.5 K/uL   Lymphocytes Relative 6 %   Lymphs Abs 0.6 (L) 1.0 - 3.6 K/uL  Monocytes Relative 12 %   Monocytes Absolute 1.2 (H) 0.2 - 1.0 K/uL   Eosinophils Relative 0 %   Eosinophils Absolute 0.0 0 - 0.7 K/uL   Basophils Relative 0 %   Basophils Absolute 0.0 0 - 0.1 K/uL    Comment: Performed at [facility], [address] [place], [place] [address]  Comprehensive metabolic panel     Status: Abnormal   Collection Time: 10/23/17  4:53 AM  Result Value Ref Range   Sodium 140 135 - 145 mmol/L   Potassium 4.6 3.5 - 5.1 mmol/L   Chloride 112 (H) 98 - 111 mmol/L   CO2 20 (L) 22 - 32 mmol/L   Glucose, Bld 164 (H) 70 - 99 mg/dL   BUN 108 (H) 8 - 23 mg/dL    Comment: RESULT CONFIRMED BY MANUAL DILUTION / [facility]   Creatinine, Ser 3.24 (H) 0.61 - 1.24 mg/dL   Calcium 8.4 (L) 8.9 - 10.3 mg/dL   Total Protein 5.5 (L) 6.5 - 8.1 g/dL   Albumin 3.1 (L) 3.5 - 5.0 g/dL   AST 99 (H) 15 - 41 U/L   ALT 435 (H) 0 - 44 U/L   Alkaline Phosphatase 67 38 - 126 U/L   Total Bilirubin 2.3 (H) 0.3 - 1.2 mg/dL   GFR calc non Af Amer 19 (L)  >60 mL/min   GFR calc Af Amer 22 (L) >60 mL/min    Comment: (NOTE) The eGFR has been calculated using the CKD EPI equation. This calculation has not been validated in all clinical situations. eGFR's persistently <60 mL/min signify possible Chronic Kidney Disease.    Anion gap 8 5 - 15    Comment: Performed at [facility], [address] [place], [place] [address]  Glucose, capillary     Status: Abnormal   Collection Time: 10/23/17  7:58 AM  Result Value Ref Range   Glucose-Capillary 140 (H) 70 - 99 mg/dL   Comment 1 Notify RN    Comment 2 Document in Chart   Glucose, capillary     Status: Abnormal   Collection Time: 10/23/17 11:48 AM  Result Value Ref Range   Glucose-Capillary 233 (H) 70 - 99 mg/dL  Glucose, capillary     Status: Abnormal   Collection Time: 10/23/17  4:45 PM  Result Value Ref Range   Glucose-Capillary 196 (H) 70 - 99 mg/dL    Current Facility-Administered Medications  Medication Dose Route Frequency Provider Last Rate Last Dose  . 0.9 %  sodium chloride infusion   Intravenous Continuous Holley Raring, [place], MD 40 mL/hr at 10/23/17 1208    . acetaminophen (TYLENOL) tablet 650 mg  650 mg Oral Q6H PRN Lance Coon, MD   650 mg at 10/21/17 0232  . albuterol (PROVENTIL) (2.5 MG/3ML) 0.083% nebulizer solution 2.5 mg  2.5 mg Nebulization Q4H PRN Gouru, Aruna, MD   2.5 mg at 10/18/17 1356  . amiodarone (PACERONE) tablet 200 mg  200 mg Oral Daily Wilhelmina Mcardle, MD   200 mg at 10/23/17 0834  . aspirin chewable tablet 81 mg  81 mg Oral Daily Wilhelmina Mcardle, MD   81 mg at 10/23/17 2094  . bisacodyl (DULCOLAX) suppository 10 mg  10 mg Rectal Daily PRN Wilhelmina Mcardle, MD      . cefdinir (OMNICEF) capsule 300 mg  300 mg Oral Daily Gouru, Aruna, MD   300 mg at 10/23/17 0836  . chlorhexidine gluconate (MEDLINE KIT) (PERIDEX) 0.12 % solution 15 mL  15 mL Mouth Rinse BID Awilda Bill, NP   15 mL at 10/19/17 2041  . digoxin (LANOXIN) tablet 125 mcg  125 mcg Oral  Dub Mikes, MD   125 mcg at 10/23/17 2878  . docusate (COLACE) 50 MG/5ML liquid 100 mg  100 mg Per Tube BID PRN Wilhelmina Mcardle, MD      . famotidine (PEPCID) tablet 20 mg  20 mg Oral Daily Gouru, Aruna, MD   20 mg at 10/23/17 0834  . haloperidol lactate (HALDOL) injection 0.5 mg  0.5 mg Intravenous QHS Laurence Folz T, MD      . insulin aspart (novoLOG) injection 0-15 Units  0-15 Units Subcutaneous TID WC Nicholes Mango, MD   3 Units at 10/23/17 1730  . insulin aspart (novoLOG) injection 5 Units  5 Units Subcutaneous TID WC Gouru, Aruna, MD   5 Units at 10/23/17 1729  . insulin glargine (LANTUS) injection 9 Units  9 Units Subcutaneous Daily Nicholes Mango, MD   9 Units at 10/23/17 0824  . ipratropium-albuterol (DUONEB) 0.5-2.5 (3) MG/3ML nebulizer solution 3 mL  3 mL Nebulization Q4H PRN Wilhelmina Mcardle, MD      . metoprolol succinate (TOPROL-XL) 24 hr tablet 50 mg  50 mg Oral Daily Gouru, Aruna, MD   50 mg at 10/23/17 0836  . metoprolol tartrate (LOPRESSOR) injection 2.5 mg  2.5 mg Intravenous Q3H PRN Wilhelmina Mcardle, MD      . nitroGLYCERIN (NITRODUR - Dosed in mg/24 hr) patch 0.3 mg  0.3 mg Transdermal Daily Wilhelmina Mcardle, MD   0.3 mg at 10/23/17 6767  . nitroGLYCERIN (NITROSTAT) SL tablet 0.4 mg  0.4 mg Sublingual Q5 min PRN Arta Silence, MD   0.4 mg at 10/18/17 0730  . ondansetron (ZOFRAN) injection 4 mg  4 mg Intravenous Q6H PRN Arta Silence, MD      . senna-docusate (Senokot-S) tablet 1 tablet  1 tablet Oral QHS PRN Arta Silence, MD      . traZODone (DESYREL) tablet 50 mg  50 mg Oral QHS PRN Awilda Bill, NP   50 mg at 10/21/17 2218    Musculoskeletal: Strength & Muscle Tone: decreased Gait & Station: unsteady Patient leans: N/A  Psychiatric Specialty Exam: Physical Exam  Nursing note and vitals reviewed. Constitutional: He appears well-developed and well-nourished.  HENT:  Head: Normocephalic and atraumatic.  Eyes: Pupils are equal, round,  and reactive to light. Conjunctivae are normal.  Neck: Normal range of motion.  Cardiovascular: Regular rhythm and normal heart sounds.  Respiratory: Effort normal. No respiratory distress.  GI: Soft.  Musculoskeletal: Normal range of motion.  Neurological: He is alert.  Skin: Skin is warm and dry.  Psychiatric: He has a normal mood and affect. His speech is delayed. He is slowed. Thought content is not paranoid. He expresses impulsivity. He expresses no homicidal and no suicidal ideation. He exhibits abnormal recent memory.    Review of Systems  Constitutional: Negative.   HENT: Negative.   Eyes: Negative.   Respiratory: Negative.   Cardiovascular: Negative.   Gastrointestinal: Negative.   Musculoskeletal: Negative.   Skin: Negative.   Neurological: Negative.   Psychiatric/Behavioral: Negative.     Blood pressure 105/64, pulse 61, temperature 98.4 F (36.9 C), resp. rate 18, height '5\' 8"'$  (1.727 m), weight 92.8 kg (204 lb 9.6 oz), SpO2 98 %.Body mass index is 31.11 kg/m.  General Appearance: Disheveled  Eye Contact:  Fair  Speech:  Slow  Volume:  Decreased  Mood:  Euthymic  Affect:  Constricted  Thought Process:  Goal Directed  Orientation:  Full (Time, Place, and Person)  Thought Content:  Logical  Suicidal Thoughts:  No  Homicidal Thoughts:  No  Memory:  Immediate;   Fair Recent;   Fair Remote;   Fair  Judgement:  Fair  Insight:  Fair  Psychomotor Activity:  Decreased  Concentration:  Concentration: Fair  Recall:  [employer] of Knowledge:  Fair  Language:  Fair  Akathisia:  No  Handed:  Right  AIMS (if indicated):     Assets:  Desire for Improvement Housing  ADL's:  Impaired  Cognition:  Impaired,  Mild  Sleep:        Treatment Plan Summary: Daily contact with patient to assess and evaluate symptoms and progress in treatment, Medication management and Plan 67 year old man who is certainly much more lucid today.  The question arose from case management about  whether the patient really has capacity.  He refused the offer of a skilled nursing bed.  Clearly he wants to get back home as soon as possible but it is not clear if he really appreciates the degree of help he is going to need.  On the other hand were not sure how much of the actual help from "lady friends" he has.  I am going to cut down his Haldol dose dramatically to only at night for now since he is doing so much better.  Reassess tomorrow to focus on whether he really can make a reasonable decision about disposition.  Disposition: No evidence of imminent risk to self or others at present.   Patient does not meet criteria for psychiatric inpatient admission. Supportive therapy provided about ongoing stressors.  Alethia Berthold, MD 10/23/2017 6:07 PM

## 2017-10-23 NOTE — Progress Notes (Signed)
[facility], [place] 10/23/17  Subjective:  Patient more awake and alert this a.m. Renal function only marginally improved thus far. No signs of pulmonary edema at the moment.   Objective:  Vital signs in last 24 hours:  Temp:  [97.3 F (36.3 C)-97.8 F (36.6 C)] 97.5 F (36.4 C) (07/31 0828) Pulse Rate:  [61-71] 71 (07/31 0828) Resp:  [14-20] 20 (07/31 0828) BP: (100-115)/(62-71) 103/65 (07/31 0828) SpO2:  [94 %-100 %] 99 % (07/31 0828) Weight:  [92.8 kg (204 lb 9.6 oz)] 92.8 kg (204 lb 9.6 oz) (07/31 0409)  Weight change:  Filed Weights   10/19/17 0357 10/22/17 0749 10/23/17 0409  Weight: 90.4 kg (199 lb 3.2 oz) 92 kg (202 lb 12.8 oz) 92.8 kg (204 lb 9.6 oz)    Intake/Output:    Intake/Output Summary (Last 24 hours) at 10/23/2017 1200 Last data filed at 10/23/2017 0500 Gross per 24 hour  Intake 771.33 ml  Output 1300 ml  Net -528.67 ml     Physical Exam: General:  No acute distress, laying in bed  HEENT  anicteric, moist oral mucous membranes  Neck:  Supple  Lungs:  Clear to auscultation bilaterally  Heart::  Irregular rhythm, no rub  Abdomen:  Soft, NTND BS present  Extremities:  2+ peripheral edema  Neurologic:  Awake, alert, oriented x 3 today  Skin:  skin in LE covered this AM        Basic Metabolic Panel:  Recent Labs  Lab 10/19/17 0627 10/20/17 0027 10/21/17 0935 10/22/17 0431 10/23/17 0453  NA 135 133* 132* 133* 140  K 4.1 3.9 3.8 4.1 4.6  CL 107 105 104 107 112*  CO2 14* 16* 17* 18* 20*  GLUCOSE 202* 233* 229* 200* 164*  BUN 71* 91* 104* 110* 108*  CREATININE 3.22* 3.02* 3.30* 3.46* 3.24*  CALCIUM 7.5* 7.5* 7.9* 8.0* 8.4*     CBC: Recent Labs  Lab 10/19/17 0627 10/20/17 0027 10/21/17 0935 10/22/17 0431 10/23/17 0453  WBC 5.5 7.4 7.8 7.5 10.5  NEUTROABS 4.6 6.5 6.9* 6.5 8.6*  HGB 10.8* 10.8* 10.2* 10.0* 11.0*  HCT 32.2* 31.8* 29.8* 28.7* 31.1*  MCV 86.5 84.4 84.7 84.3 84.6  PLT 54* 74* 120* 125*  174      Lab Results  Component Value Date   HEPBSAG Negative 10/17/2017   HEPBIGM Negative 10/17/2017      Microbiology:  Recent Results (from the past 240 hour(s))  Blood Culture (routine x 2)     Status: Abnormal   Collection Time: 10/15/17  8:10 PM  Result Value Ref Range Status   Specimen Description   Final    BLOOD LEFT ANTECUBITAL Performed at [facility], [address], [place], [place] [address]    Special Requests   Final    BOTTLES DRAWN AEROBIC AND ANAEROBIC Blood Culture adequate volume Performed at [facility], [address] [place], [place] [address]    Culture  Setup Time   Final    AEROBIC BOTTLE ONLY GRAM POSITIVE COCCI CRITICAL RESULT CALLED TO, READ BACK BY AND VERIFIED WITH: SHEEMA HALLAJI 10/16/17 1437 KLW    Culture (A)  Final    STAPHYLOCOCCUS SPECIES (COAGULASE NEGATIVE) THE SIGNIFICANCE OF ISOLATING THIS ORGANISM FROM A SINGLE SET OF BLOOD CULTURES WHEN MULTIPLE SETS ARE DRAWN IS UNCERTAIN. PLEASE NOTIFY THE MICROBIOLOGY DEPARTMENT WITHIN ONE WEEK IF SPECIATION AND SENSITIVITIES ARE REQUIRED. Performed at [facility], [place] [address], [place], [place] [address]  Report Status 10/18/2017 FINAL  Final  Blood Culture (routine x 2)     Status: None   Collection Time: 10/15/17  8:10 PM  Result Value Ref Range Status   Specimen Description BLOOD BLOOD RIGHT WRIST  Final   Special Requests   Final    BOTTLES DRAWN AEROBIC AND ANAEROBIC Blood Culture adequate volume   Culture   Final    NO GROWTH 5 DAYS Performed at [facility], [address] [place], [place] [address]    Report Status 10/20/2017 FINAL  Final  Blood Culture ID Panel (Reflexed)     Status: Abnormal   Collection Time: 10/15/17  8:10 PM  Result Value Ref Range Status   Enterococcus species NOT DETECTED NOT DETECTED Final   Listeria monocytogenes NOT DETECTED NOT DETECTED Final   Staphylococcus species DETECTED (A) NOT DETECTED Final     Comment: Methicillin (oxacillin) susceptible coagulase negative staphylococcus. Possible blood culture contaminant (unless isolated from more than one blood culture draw or clinical case suggests pathogenicity). No antibiotic treatment is indicated for blood  culture contaminants. CRITICAL RESULT CALLED TO, READ BACK BY AND VERIFIED WITH: SHEEMA HALLAJI 10/16/17 1437 KLW    Staphylococcus aureus NOT DETECTED NOT DETECTED Final   Methicillin resistance NOT DETECTED NOT DETECTED Final   Streptococcus species NOT DETECTED NOT DETECTED Final   Streptococcus agalactiae NOT DETECTED NOT DETECTED Final   Streptococcus pneumoniae NOT DETECTED NOT DETECTED Final   Streptococcus pyogenes NOT DETECTED NOT DETECTED Final   Acinetobacter baumannii NOT DETECTED NOT DETECTED Final   Enterobacteriaceae species NOT DETECTED NOT DETECTED Final   Enterobacter cloacae complex NOT DETECTED NOT DETECTED Final   Escherichia coli NOT DETECTED NOT DETECTED Final   Klebsiella oxytoca NOT DETECTED NOT DETECTED Final   Klebsiella pneumoniae NOT DETECTED NOT DETECTED Final   Proteus species NOT DETECTED NOT DETECTED Final   Serratia marcescens NOT DETECTED NOT DETECTED Final   Haemophilus influenzae NOT DETECTED NOT DETECTED Final   Neisseria meningitidis NOT DETECTED NOT DETECTED Final   Pseudomonas aeruginosa NOT DETECTED NOT DETECTED Final   Candida albicans NOT DETECTED NOT DETECTED Final   Candida glabrata NOT DETECTED NOT DETECTED Final   Candida krusei NOT DETECTED NOT DETECTED Final   Candida parapsilosis NOT DETECTED NOT DETECTED Final   Candida tropicalis NOT DETECTED NOT DETECTED Final    Comment: Performed at [facility], [address] [place], [place], [place] [address]  Urine culture     Status: Abnormal   Collection Time: 10/16/17  4:11 AM  Result Value Ref Range Status   Specimen Description   Final    URINE, RANDOM Performed at [facility], [address] [place], [place]  [address]    Special Requests   Final    NONE Performed at [facility], [address] [place], [place] [address]    Culture >=100,000 COLONIES/mL ESCHERICHIA COLI (A)  Final   Report Status 10/18/2017 FINAL  Final   Organism ID, Bacteria ESCHERICHIA COLI (A)  Final      Susceptibility   Escherichia coli - MIC*    AMPICILLIN 8 SENSITIVE Sensitive     CEFAZOLIN <=4 SENSITIVE Sensitive     CEFTRIAXONE <=1 SENSITIVE Sensitive     CIPROFLOXACIN <=0.25 SENSITIVE Sensitive     GENTAMICIN <=1 SENSITIVE Sensitive     IMIPENEM <=0.25 SENSITIVE Sensitive     NITROFURANTOIN <=16 SENSITIVE Sensitive     TRIMETH/SULFA <=20 SENSITIVE Sensitive     AMPICILLIN/SULBACTAM 4  SENSITIVE Sensitive     PIP/TAZO <=4 SENSITIVE Sensitive     Extended ESBL NEGATIVE Sensitive     * >=100,000 COLONIES/mL ESCHERICHIA COLI  CULTURE, BLOOD (ROUTINE X 2) w Reflex to ID Panel     Status: None   Collection Time: 10/16/17  4:12 PM  Result Value Ref Range Status   Specimen Description BLOOD BLOOD LEFT HAND  Final   Special Requests   Final    BOTTLES DRAWN AEROBIC AND ANAEROBIC Blood Culture results may not be optimal due to an excessive volume of blood received in culture bottles   Culture   Final    NO GROWTH 5 DAYS Performed at [facility], [address] [place], [place] [address]    Report Status 10/21/2017 FINAL  Final  CULTURE, BLOOD (ROUTINE X 2) w Reflex to ID Panel     Status: None   Collection Time: 10/16/17  4:43 PM  Result Value Ref Range Status   Specimen Description BLOOD LEFT HAND  Final   Special Requests   Final    BOTTLES DRAWN AEROBIC AND ANAEROBIC Blood Culture results may not be optimal due to an inadequate volume of blood received in culture bottles   Culture   Final    NO GROWTH 5 DAYS Performed at [facility], [address] [place], [place] [address]    Report Status 10/21/2017 FINAL  Final  MRSA PCR Screening     Status: None   Collection Time:  10/16/17  5:01 PM  Result Value Ref Range Status   MRSA by PCR NEGATIVE NEGATIVE Final    Comment:        The GeneXpert MRSA Assay (FDA approved for NASAL specimens only), is one component of a comprehensive MRSA colonization surveillance program. It is not intended to diagnose MRSA infection nor to guide or monitor treatment for MRSA infections. Performed at [facility], [address] [place], [place] [address]     Coagulation Studies: Recent Labs    10/22/17 1654  LABPROT 16.9*  INR 1.39    Urinalysis: No results for input(s): COLORURINE, LABSPEC, PHURINE, GLUCOSEU, HGBUR, BILIRUBINUR, KETONESUR, PROTEINUR, UROBILINOGEN, NITRITE, LEUKOCYTESUR in the last 72 hours.  Invalid input(s): APPERANCEUR    Imaging: US Renal  Result Date: 10/22/2017 CLINICAL DATA:  Acute renal failure EXAM: RENAL / URINARY TRACT ULTRASOUND COMPLETE COMPARISON:  10/18/2017 FINDINGS: Right Kidney: Length: 10.7 cm. Echogenicity within normal limits. No mass or hydronephrosis visualized. Left Kidney: Length: 11.5 cm. Echogenicity within normal limits. No mass or hydronephrosis visualized. Bladder: Appears normal for degree of bladder distention. IMPRESSION: Unremarkable renal ultrasound. Electronically Signed   By: Rolm Baptise M.D.   On: 10/22/2017 10:02     Medications:   . sodium chloride 40 mL/hr at 10/22/17 2000   . amiodarone  200 mg Oral Daily  . aspirin  81 mg Oral Daily  . cefdinir  300 mg Oral Daily  . chlorhexidine gluconate (MEDLINE KIT)  15 mL Mouth Rinse BID  . digoxin  125 mcg Oral QODAY  . famotidine  20 mg Oral Daily  . haloperidol lactate  1 mg Intravenous Q8H  . insulin aspart  0-15 Units Subcutaneous TID WC  . insulin aspart  5 Units Subcutaneous TID WC  . insulin glargine  9 Units Subcutaneous Daily  . metoprolol succinate  50 mg Oral Daily  . nitroGLYCERIN  0.3 mg Transdermal Daily   acetaminophen, albuterol, bisacodyl, docusate, ipratropium-albuterol,  metoprolol tartrate, nitroGLYCERIN, [DISCONTINUED] ondansetron **OR**  ondansetron (ZOFRAN) IV, senna-docusate, traZODone  Assessment/ Plan:  67 y.o. Caucasian male with medical problems of severe cardiomyopathy with LVEF less than 10% followed at [facility], atrial fibrillation, AICD diabetes, hypertension, history of stroke, CKD, who was admitted to [facility] on 10/15/2017 for evaluation of maculopapular rash over lower extremities and AICD firing.  Hospital course complicated by wide-complex tachycardia, severe respiratory distress, transferred to ICU, ventilator support.  Patient self extubated.  1.  Acute renal failure, chronic kidney disease stage III Likely ATN from cardiorenal syndrome, hypoperfusion Baseline creatinine 1.32 at admission/GFR 55 -Renal function continues to be significantly worse than baseline.  BUN remains high at 108 with a creatinine of 3.24.  His confusion is a bit better today.  He also appears to have liver function abnormalities.  No urgent indication for dialysis at the moment however he is not clear for discharge at this time.  Continue to monitor renal parameters closely.  2.  Urinary tract infection from E. coli Patient transitioned to Cefdinir 300 mg by mouth daily for UTI.  3.  Chronic systolic congestive heart failure with AICD Patient has been placed on IV fluid hydration with 0.9 normal saline as we suspected that he may have had some element of intravascular volume depletion.  No signs of pulmonary edema at the moment.  Continue 0.9 normal saline for 1 additional day.      LOS: 7 Danford Tat 7/31/201912:00 PM  [place], [place]  Note: This note was prepared with Dragon dictation. Any transcription errors are unintentional

## 2017-10-23 NOTE — Progress Notes (Signed)
Physical Therapy Treatment Patient Details Name: Derek Barnes MRN: [id number] DOB: [birth year] Today's Date: 10/23/2017    History of Present Illness Derek Barnes is a 67yo male who comes to [facility] on 7/23 with BLE rash, and associated dyspnea, respiratory distress, and tachycardia. Pt required intubation, (self extubated) , and also noted to have a UTI c e Coli. 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 PMH: severe cardiomyopathy with 10% EF, AF, AICD, DM, HTN, CVA, CKD.     PT Comments    Pt able to ambulate around nursing loop no AD (CGA x2 for safety d/t nurse reporting pt just received Haldol--pt opened eyes upon therapist entering room and did not appear drowsy during session although did appear more calm in nature compared to impulsiveness noted last session).  Mild unsteadiness noted at times with ambulation but no loss of balance noted requiring assist to steady.  Pt performed B LE standing ex's fairly well.  Oriented x4 although some decreased safety awareness noted during session.  Will continue to progress pt with strengthening, balance, and progressive functional mobility per pt tolerance.    Follow Up Recommendations  Home health PT;Supervision/Assistance - 24 hour     Equipment Recommendations  None recommended by PT    Recommendations for Other Services  OT consult     Precautions / Restrictions Precautions Precautions: Fall Restrictions Weight Bearing Restrictions: No    Mobility  Bed Mobility Overal bed mobility: Modified Independent             General bed mobility comments: Supine to/from sit without any noted difficulties  Transfers Overall transfer level: Needs assistance Equipment used: None Transfers: Sit to/from Stand Sit to Stand: Min guard         General transfer comment: midly unsteady standing initially but able to regain balance on own (no UE support); steady with 2 additional standing trials from  bed  Ambulation/Gait Ambulation/Gait assistance: Min guard;+2 safety/equipment Gait Distance (Feet): 200 Feet Assistive device: None   Gait velocity: decreased   General Gait Details: wide BOS; decreased B foot clearance and heelstrike; mildly unsteady at times but no overt loss of balance noted   Stairs             Wheelchair Mobility    Modified Rankin (Stroke Patients Only)       Balance Overall balance assessment: Needs assistance Sitting-balance support: No upper extremity supported;Feet supported Sitting balance-Leahy Scale: Normal Sitting balance - Comments: steady sitting reaching outside BOS   Standing balance support: During functional activity Standing balance-Leahy Scale: Good Standing balance comment: mild unsteadiness with ambulation but no overt loss of balance noted                            Cognition Arousal/Alertness: Awake/alert Behavior During Therapy: (calm) Overall Cognitive Status: (Oriented to person, place, time, and situation)                                        Exercises General Exercises - Lower Extremity (all with UE support) Hip ABduction/ADduction: AROM;Strengthening;Both;10 reps;Standing Hip Flexion/Marching: AROM;Strengthening;Both;10 reps;Seated Heel Raises: AROM;Strengthening;Both;10 reps;Standing Other Exercises Other Exercises: Standing B LE hamstring curls x10 reps    General Comments   Nursing cleared pt for participation in physical therapy.  Pt agreeable to PT session.      Pertinent Vitals/Pain  Pain Assessment: No/denies pain.  Vitals (HR and O2 on room air) stable and WFL throughout treatment session.    Home Living                      Prior Function            PT Goals (current goals can now be found in the care plan section) Acute Rehab PT Goals Patient Stated Goal: Regain independence in mobility PT Goal Formulation: With patient Time For Goal Achievement:  11/02/17 Potential to Achieve Goals: Good Progress towards PT goals: Progressing toward goals    Frequency    Min 2X/week      PT Plan Discharge plan needs to be updated(CM and SW notified)    Co-evaluation              AM-PAC PT "6 Clicks" Daily Activity  Outcome Measure  Difficulty turning over in bed (including adjusting bedclothes, sheets and blankets)?: None Difficulty moving from lying on back to sitting on the side of the bed? : None Difficulty sitting down on and standing up from a chair with arms (e.g., wheelchair, bedside commode, etc,.)?: Unable Help needed moving to and from a bed to chair (including a wheelchair)?: A Little Help needed walking in hospital room?: A Little Help needed climbing 3-5 steps with a railing? : A Little 6 Click Score: 18    End of Session Equipment Utilized During Treatment: Gait belt Activity Tolerance: Patient tolerated treatment well Patient left: in bed;with call bell/phone within reach;with bed alarm set Nurse Communication: Mobility status;Precautions PT Visit Diagnosis: Unsteadiness on feet (R26.81);Other abnormalities of gait and mobility (R26.89);Muscle weakness (generalized) (M62.81);Ataxic gait (R26.0)     Time: 8242-3536 PT Time Calculation (min) (ACUTE ONLY): 27 min  Charges:  $Gait Training: 8-22 mins $Therapeutic Exercise: 8-22 mins                     Hendricks Limes, PT 10/23/17, 3:44 PM [phone]

## 2017-10-24 ENCOUNTER — Ambulatory Visit (HOSPITAL_COMMUNITY)
Admission: AD | Admit: 2017-10-24 | Discharge: 2017-10-24 | Disposition: A | Discharge status: Home / Self Care | Payer: BC Managed Care – PPO | Source: Other Acute Inpatient Hospital | Attending: Internal Medicine | Admitting: Internal Medicine

## 2017-10-24 DIAGNOSIS — I509 Heart failure, unspecified: Secondary | ICD-10-CM | POA: Insufficient documentation

## 2017-10-24 LAB — GLUCOSE, CAPILLARY
GLUCOSE-CAPILLARY: 115 mg/dL — AB (ref 70–99)
GLUCOSE-CAPILLARY: 143 mg/dL — AB (ref 70–99)
GLUCOSE-CAPILLARY: 133 mg/dL — AB (ref 70–99)
GLUCOSE-CAPILLARY: 129 mg/dL — AB (ref 70–99)
GLUCOSE-CAPILLARY: 98 mg/dL (ref 70–99)
GLUCOSE-CAPILLARY: 93 mg/dL (ref 70–99)
GLUCOSE-CAPILLARY: 92 mg/dL (ref 70–99)

## 2017-10-24 LAB — BASIC METABOLIC PANEL
Anion gap: 7 (ref 5–15)
BUN: 127 mg/dL — AB (ref 8–23)
CO2: 21 mmol/L — AB (ref 22–32)
CREATININE: 3.62 mg/dL — AB (ref 0.61–1.24)
Calcium: 8.3 mg/dL — ABNORMAL LOW (ref 8.9–10.3)
Chloride: 109 mmol/L (ref 98–111)
GFR calc Af Amer: 19 mL/min — ABNORMAL LOW (ref >60)
GFR calc non Af Amer: 16 mL/min — ABNORMAL LOW (ref >60)
GLUCOSE: 113 mg/dL — AB (ref 70–99)
POTASSIUM: 4.4 mmol/L (ref 3.5–5.1)
Sodium: 137 mmol/L (ref 135–145)

## 2017-10-24 MED ORDER — INSULIN ASPART 100 UNIT/ML ~~LOC~~ SOLN
SUBCUTANEOUS | Status: AC
  Filled 2017-10-24: qty 1

## 2017-10-24 MED ORDER — MILRINONE LACTATE IN DEXTROSE 20-5 MG/100ML-% IV SOLN
0.2500 ug/kg/min | INTRAVENOUS | Status: DC
  Administered 2017-10-24: 0.25 ug/kg/min via INTRAVENOUS
  Filled 2017-10-24: qty 100

## 2017-10-24 NOTE — Progress Notes (Signed)
Called report to the ICU at this time. All questions answered. Will deliver any medications in box. Will transfer now. Jari Favre [facility]

## 2017-10-24 NOTE — Discharge Summary (Signed)
Physician Discharge Summary  Patient ID: Derek Barnes MRN: [id number] DOB/AGE: March 01, 1951 67 y.o.  Admit date: 10/15/2017 Discharge date: 10/24/2017  Admission Diagnoses:CHF exacerbation  Discharge Diagnoses:  Advanced end stage cardiomyopathy EF 15%  Principal Problem:   Acute delirium Active Problems:   FUO (fever of unknown origin)   Congestive dilated cardiomyopathy (HCC)   Goals of care, counseling/discussion   Palliative care by specialist   Elevated liver enzymes   Acute renal failure [facility])    Hospital Course:  07/24 Transferred to ICU with severe respiratory distress, pulmonary edema, WCT. Failed BIPAP, intubated 07/24 Echocardiogram: LVEF 10-15% 07/25 Gas exchange and pulmonary edema pattern on CXR much improved. Worsening renal function and increased LFTs. Vent changes made 07/25 Self extubated and tolerating surprisingly well  Transferred to floor 8/1/Progressive renal failure Transferred back to ICU for Milrinone infusion Patient with progressive cardio-renal syndrome   I was called by NP Suzanna Obey from [facility] cardiology She states that Dr Carolynn Serve has accepted this patient to come to [facility] for further assessment(cardiac and heart transplant)   Consults: cardiology, nephrology   Treatments:  Intubation Lasix Milrinone infusion IV abx for UTI(Ecoli)  Discharge Exam: Blood pressure 113/68, pulse (!) 59, temperature (!) 96.7 F (35.9 C), temperature source Axillary, resp. rate 18, height '5\' 8"'$  (1.727 m), weight 211 lb (95.7 kg), SpO2 96 %. Physical Exam: General: No acute distress  HEENT anicteric, moist oral mucous membranes  Neck: Supple  Lungs: Clear to auscultation bilaterally  Heart:: Irregular rhythm, no rub  Abdomen: Soft, NTND BS present  Extremities: 1+ peripheral edema  Neurologic: Awake, alert, oriented x 3 today  Skin: skin in LE covered this AM      Disposition:  Transfer to [facility]   Current  Facility-Administered Medications:  .  acetaminophen (TYLENOL) tablet 650 mg, 650 mg, Oral, Q6H PRN, Lance Coon, MD, 650 mg at 10/21/17 0232 .  albuterol (PROVENTIL) (2.5 MG/3ML) 0.083% nebulizer solution 2.5 mg, 2.5 mg, Nebulization, Q4H PRN, Gouru, Aruna, MD, 2.5 mg at 10/18/17 1356 .  amiodarone (PACERONE) tablet 200 mg, 200 mg, Oral, Daily, Wilhelmina Mcardle, MD, 200 mg at 10/24/17 1009 .  aspirin chewable tablet 81 mg, 81 mg, Oral, Daily, Wilhelmina Mcardle, MD, 81 mg at 10/24/17 1010 .  bisacodyl (DULCOLAX) suppository 10 mg, 10 mg, Rectal, Daily PRN, Wilhelmina Mcardle, MD .  chlorhexidine gluconate (MEDLINE KIT) (PERIDEX) 0.12 % solution 15 mL, 15 mL, Mouth Rinse, BID, Blakeney, Dreama Saa, NP, 15 mL at 10/19/17 2041 .  digoxin (LANOXIN) tablet 125 mcg, 125 mcg, Oral, Dub Mikes, MD, 125 mcg at 10/23/17 [phone] .  docusate (COLACE) 50 MG/5ML liquid 100 mg, 100 mg, Per Tube, BID PRN, Wilhelmina Mcardle, MD .  famotidine (PEPCID) tablet 20 mg, 20 mg, Oral, Daily, Gouru, Aruna, MD, 20 mg at 10/24/17 1009 .  haloperidol lactate (HALDOL) injection 0.5 mg, 0.5 mg, Intravenous, QHS, Clapacs, John T, MD, 0.5 mg at 10/23/17 2153 .  insulin aspart (novoLOG) injection 0-15 Units, 0-15 Units, Subcutaneous, TID WC, Gouru, Aruna, MD, 2 Units at 10/24/17 1311 .  insulin aspart (novoLOG) injection 5 Units, 5 Units, Subcutaneous, TID WC, Gouru, Aruna, MD, 5 Units at 10/24/17 1312 .  insulin glargine (LANTUS) injection 9 Units, 9 Units, Subcutaneous, Daily, Gouru, Aruna, MD, 9 Units at 10/24/17 1010 .  ipratropium-albuterol (DUONEB) 0.5-2.5 (3) MG/3ML nebulizer solution 3 mL, 3 mL, Nebulization, Q4H PRN, Merton Border B, MD .  metoprolol succinate (TOPROL-XL) 24 hr  tablet 50 mg, 50 mg, Oral, Daily, Gouru, Aruna, MD, 50 mg at 10/24/17 1009 .  metoprolol tartrate (LOPRESSOR) injection 2.5 mg, 2.5 mg, Intravenous, Q3H PRN, Wilhelmina Mcardle, MD .  milrinone (PRIMACOR) 20 MG/100 ML (0.2 mg/mL) infusion, 0.25  mcg/kg/min, Intravenous, Continuous, Sudini, Srikar, MD, Last Rate: 7.2 mL/hr at 10/24/17 1521, 0.25 mcg/kg/min at 10/24/17 1521 .  nitroGLYCERIN (NITRODUR - Dosed in mg/24 hr) patch 0.3 mg, 0.3 mg, Transdermal, Daily, Merton Border B, MD, 0.3 mg at 10/24/17 1009 .  nitroGLYCERIN (NITROSTAT) SL tablet 0.4 mg, 0.4 mg, Sublingual, Q5 min PRN, Jodell Cipro, Prasanna, MD, 0.4 mg at 10/18/17 0730 .  [DISCONTINUED] ondansetron (ZOFRAN) tablet 4 mg, 4 mg, Oral, Q6H PRN **OR** ondansetron (ZOFRAN) injection 4 mg, 4 mg, Intravenous, Q6H PRN, Jodell Cipro, Prasanna, MD .  senna-docusate (Senokot-S) tablet 1 tablet, 1 tablet, Oral, QHS PRN, Jodell Cipro, Prasanna, MD .  traZODone (DESYREL) tablet 50 mg, 50 mg, Oral, QHS PRN, Awilda Bill, NP, 50 mg at 10/21/17 2218    Signed: Maretta Bees Belvie Iribe 10/24/2017, 3:35 PM

## 2017-10-24 NOTE — Progress Notes (Signed)
[place] at [place] NAME: Derek Barnes    MR#:  [id number]  DATE OF BIRTH:  04/12/1950    CHIEF COMPLAINT:   Chief Complaint  Patient presents with  . Shortness of Breath  . Nausea   Feels weak.  REVIEW OF SYSTEMS:    Review of Systems  Constitutional: Negative for chills and fever.  HENT: Negative for hearing loss.   Eyes: Negative for blurred vision, double vision and photophobia.  Respiratory: Negative for cough, hemoptysis and shortness of breath.   Cardiovascular: Negative for palpitations, orthopnea and leg swelling.  Gastrointestinal: Negative for abdominal pain, diarrhea and vomiting.  Genitourinary: Negative for dysuria and urgency.  Musculoskeletal: Negative for myalgias and neck pain.  Skin: Negative for rash.  Neurological: Negative for dizziness, focal weakness, seizures, weakness and headaches.  Psychiatric/Behavioral: Negative for memory loss. The patient does not have insomnia.    DRUG ALLERGIES:   Allergies  Allergen Reactions  . Contrast Media [Iodinated Diagnostic Agents] Anaphylaxis  . Penicillins Anaphylaxis    Has patient had a PCN reaction causing immediate rash, facial/tongue/throat swelling, SOB or lightheadedness with hypotension: Yes Has patient had a PCN reaction causing severe rash involving mucus membranes or skin necrosis: Yes Has patient had a PCN reaction that required hospitalization: No Has patient had a PCN reaction occurring within the last 10 years: No If all of the above answers are "NO", then may proceed with Cephalosporin use.   . Sotalol Other (See Comments)    Excessive fatigue    VITALS:  Blood pressure 123/83, pulse 69, temperature (!) 97.4 F (36.3 C), temperature source Oral, resp. rate 18, height _0  (1.727 m), weight 95.7 kg (211 lb), SpO2 95 %.  PHYSICAL EXAMINATION:   Physical Exam  GENERAL:  67 y.o.-year-old patient lying in the bed with obviousrespiratory  distress. EYES: Pupils equal, round, reactive to light and accommodation. No scleral icterus.  HEENT: Head atraumatic, normocephalic. Oropharynx and nasopharynx clear.  NECK: Supple, no jugular venous distention. No thyroid enlargement, no tenderness.  LUNGS: Left lower lobe crackles CARDIOVASCULAR:  S1, S2  . Marland Kitchen  ABDOMEN: Soft, nontender, nondistended. Bowel sounds present. No organomegaly or mass.  EXTREMITIES: Trace pedal edema..  Cyanosis, or clubbing.  NEUROLOGIC, awake, oriented.  Cranial nerves II through XII intact, 5/5 upper and lower extremities.  Sensations are intact.  DTRs 2+ bilaterally. PSYCHIATRIC: The patient is alert and awake SKIN: No obvious rash, lesion, or ulcer.   LABORATORY PANEL:   CBC Recent Labs  Lab 10/23/17 0453  WBC 10.5  HGB 11.0*  HCT 31.1*  PLT 174   ------------------------------------------------------------------------------------------------------------------  Chemistries  Recent Labs  Lab 10/23/17 0453 10/24/17 0513  NA 140 137  K 4.6 4.4  CL 112* 109  CO2 20* 21*  GLUCOSE 164* 113*  BUN 108* 127*  CREATININE 3.24* 3.62*  CALCIUM 8.4* 8.3*  AST 99*  --   ALT 435*  --   ALKPHOS 67  --   BILITOT 2.3*  --    ------------------------------------------------------------------------------------------------------------------  Cardiac Enzymes No results for input(s): TROPONINI in the last 168 hours. ------------------------------------------------------------------------------------------------------------------  RADIOLOGY:  No results found.   ASSESSMENT AND PLAN:   Principal Problem:   Acute delirium Active Problems:   FUO (fever of unknown origin)   Congestive dilated cardiomyopathy (HCC)   Goals of care, counseling/discussion   Palliative care by specialist   Elevated liver enzymes   Acute renal failure ([place])  67 year old male patient  with history of atrial fibrillation, V. tach, status post AICD placement and follows  with [facility] cardiology, patient also has diabetes mellitus type 2, CVA admitted for maculopapular rash and fever and AICD firing.  Patient had respiratory distress with tachycardia yesterday, transferred to ICU.   # Acute kidney injury over CKD-3 - worsening Avoid nephrotoxins  Renal ultrasound with no Hydronephrosis Discussed with Dr. Holley Raring.  Advised dobutamine drip.  Will check with cardiology.  Vitals are stable.  # Acute delirium with hallucinations could be from steroid flare Discontinued steroids Psychiatry consult placed.  Appreciate input.  #Shock liver secondary to ischemic hepatitis Improving.   Ammonia level normal  Abdominal ultrasound no ascites Hepatitis panel negative  f/u  HSV, Epstein-Barr virus and CMV  #   Acute respiratory distress secondary to acute pulmonary edema Was in ICU on bipap and now on RA. Patient got transferred to floor today 10/17/2017  #Thrombocytopenia could be from sepsis  Resolved  #.  Atrial fibrillation: Patient was on amiodarone drip, heparin drip, both are discontinued currently rate controlled.  Amiodarone changed to p.o.   # Sepsis met criteria with fever and tachycardia secondary to pneumonia and UTI  UTI, patient also had maculopapular rash s by Dr. Graylon Good from ID, recommended  Rocephin,  Blood cultures 1 out of 4 sets positive for staph species.  Changed antibiotics to p.o. omnicef which covers both pneumonia and UTI urineCulture showing more than 100,000 colonies of E. coli.  B cx negative.  CODE STATUS: Partial   TOTAL TIME TAKING CARE OF THIS PATIENT: 33 minutes.   Leia Alf Langley Ingalls M.D on 10/24/2017 at 12:52 PM  Between 7am to 6pm - Pager - [phone]  After 6pm go to [email or web address] - password [email or web address] [facility]  [place] [facility]  Office  [phone]  CC: Primary care physician; Phineas Inches, MD

## 2017-10-24 NOTE — Progress Notes (Signed)
[place] at [place] NAME: Derek Barnes    MR#:  [id number]  DATE OF BIRTH:  November 22, 1950    CHIEF COMPLAINT:   Chief Complaint  Patient presents with  . Shortness of Breath  . Nausea   Feels weak.  REVIEW OF SYSTEMS:    Review of Systems  Constitutional: Negative for chills and fever.  HENT: Negative for hearing loss.   Eyes: Negative for blurred vision, double vision and photophobia.  Respiratory: Negative for cough, hemoptysis and shortness of breath.   Cardiovascular: Negative for palpitations, orthopnea and leg swelling.  Gastrointestinal: Negative for abdominal pain, diarrhea and vomiting.  Genitourinary: Negative for dysuria and urgency.  Musculoskeletal: Negative for myalgias and neck pain.  Skin: Negative for rash.  Neurological: Negative for dizziness, focal weakness, seizures, weakness and headaches.  Psychiatric/Behavioral: Negative for memory loss. The patient does not have insomnia.    DRUG ALLERGIES:   Allergies  Allergen Reactions  . Contrast Media [Iodinated Diagnostic Agents] Anaphylaxis  . Penicillins Anaphylaxis    Has patient had a PCN reaction causing immediate rash, facial/tongue/throat swelling, SOB or lightheadedness with hypotension: Yes Has patient had a PCN reaction causing severe rash involving mucus membranes or skin necrosis: Yes Has patient had a PCN reaction that required hospitalization: No Has patient had a PCN reaction occurring within the last 10 years: No If all of the above answers are "NO", then may proceed with Cephalosporin use.   . Sotalol Other (See Comments)    Excessive fatigue    VITALS:  Blood pressure 121/70, pulse 62, temperature (!) 97.4 F (36.3 C), temperature source Oral, resp. rate 18, height '5\' 8"'$  (1.727 m), weight 95.7 kg (211 lb), SpO2 100 %.  PHYSICAL EXAMINATION:   Physical Exam  GENERAL:  67 y.o.-year-old patient lying in the bed with obviousrespiratory  distress. EYES: Pupils equal, round, reactive to light and accommodation. No scleral icterus.  HEENT: Head atraumatic, normocephalic. Oropharynx and nasopharynx clear.  NECK: Supple, no jugular venous distention. No thyroid enlargement, no tenderness.  LUNGS: Left lower lobe crackles CARDIOVASCULAR:  S1, S2  . Marland Kitchen  ABDOMEN: Soft, nontender, nondistended. Bowel sounds present. No organomegaly or mass.  EXTREMITIES: Trace pedal edema..  Cyanosis, or clubbing.  NEUROLOGIC, awake, oriented.  Cranial nerves II through XII intact, 5/5 upper and lower extremities.  Sensations are intact.  DTRs 2+ bilaterally. PSYCHIATRIC: The patient is alert and awake SKIN: No obvious rash, lesion, or ulcer.   LABORATORY PANEL:   CBC Recent Labs  Lab 10/23/17 0453  WBC 10.5  HGB 11.0*  HCT 31.1*  PLT 174   ------------------------------------------------------------------------------------------------------------------  Chemistries  Recent Labs  Lab 10/23/17 0453 10/24/17 0513  NA 140 137  K 4.6 4.4  CL 112* 109  CO2 20* 21*  GLUCOSE 164* 113*  BUN 108* 127*  CREATININE 3.24* 3.62*  CALCIUM 8.4* 8.3*  AST 99*  --   ALT 435*  --   ALKPHOS 67  --   BILITOT 2.3*  --    ------------------------------------------------------------------------------------------------------------------  Cardiac Enzymes No results for input(s): TROPONINI in the last 168 hours. ------------------------------------------------------------------------------------------------------------------  RADIOLOGY:  No results found.   ASSESSMENT AND PLAN:   Principal Problem:   Acute delirium Active Problems:   FUO (fever of unknown origin)   Congestive dilated cardiomyopathy (HCC)   Goals of care, counseling/discussion   Palliative care by specialist   Elevated liver enzymes   Acute renal failure ([place])  67 year old male patient  with history of atrial fibrillation, V. tach, status post AICD placement and follows  with [facility] cardiology, patient also has diabetes mellitus type 2, CVA admitted for maculopapular rash and fever and AICD firing.  Patient had respiratory distress with tachycardia yesterday, transferred to ICU.   # Acute kidney injury over CKD-3 - worsening Avoid nephrotoxins  Renal ultrasound with no Hydronephrosis Discussed with Dr. Holley Raring.  Patient needs inotropic support.  Discussed with Dr. Towanda Malkin.  Will start milrinone drip.  Discussed with Dr. cost of ICU and patient will be transferred to ICU bed.  Critically ill.  High risk for complications.  # Acute delirium with hallucinations could be from steroid flare Discontinued steroids Psychiatry consult placed.  Appreciate input.  #Shock liver secondary to ischemic hepatitis Improving.   Ammonia level normal  Abdominal ultrasound no ascites Hepatitis panel negative  f/u  HSV, Epstein-Barr virus and CMV  #   Acute respiratory distress secondary to acute pulmonary edema Was in ICU on bipap and now on RA. Patient got transferred to floor today 10/17/2017  #Thrombocytopenia could be from sepsis  Resolved  #.  Atrial fibrillation: Patient was on amiodarone drip, heparin drip, both are discontinued currently rate controlled.  Amiodarone changed to p.o.   # Sepsis met criteria with fever and tachycardia secondary to pneumonia and UTI  UTI, patient also had maculopapular rash s by Dr. Graylon Good from ID, recommended  Rocephin,  Blood cultures 1 out of 4 sets positive for staph species.  Changed antibiotics to p.o. omnicef which covers both pneumonia and UTI urineCulture showing more than 100,000 colonies of E. coli.  B cx negative.  CODE STATUS: Partial   TOTAL CC TIME TAKING CARE OF THIS PATIENT: 35 minutes.   Leia Alf Latria Mccarron M.D on 10/24/2017 at 2:43 PM  Between 7am to 6pm - Pager - [phone]  After 6pm go to [email or web address] - password [email or web address] [facility]  [place] [facility]  Office  [phone]  CC: Primary care physician;  Phineas Inches, MD

## 2017-10-24 NOTE — Progress Notes (Signed)
[place] NOTE  Pharmacy Consult for milrinone dosing  Indication: chronic systolic congestive heart failure with AICD, EF 10-15%    Allergies  Allergen Reactions  . Contrast Media [Iodinated Diagnostic Agents] Anaphylaxis  . Penicillins Anaphylaxis    Has patient had a PCN reaction causing immediate rash, facial/tongue/throat swelling, SOB or lightheadedness with hypotension: Yes Has patient had a PCN reaction causing severe rash involving mucus membranes or skin necrosis: Yes Has patient had a PCN reaction that required hospitalization: No Has patient had a PCN reaction occurring within the last 10 years: No If all of the above answers are "NO", then may proceed with Cephalosporin use.   . Sotalol Other (See Comments)    Excessive fatigue    Patient Measurements: Height: _0  (172.7 cm) Weight: 211 lb (95.7 kg) IBW/kg (Calculated) : 68.4  Vital Signs: Temp: 96.7 F (35.9 C) (08/01 1455) Temp Source: Axillary (08/01 1455) BP: 104/58 (08/01 1700) Pulse Rate: 73 (08/01 1700) Intake/Output from previous day: 07/31 0701 - 08/01 0700 In: 520 [I.V.:520] Out: 1700 [Urine:1700] Intake/Output from this shift: No intake/output data recorded. Vent settings for last 24 hours:    Labs: Recent Labs    10/22/17 0431 10/22/17 1654 10/23/17 0453 10/24/17 0513  WBC 7.5  --  10.5  --   HGB 10.0*  --  11.0*  --   HCT 28.7*  --  31.1*  --   PLT 125*  --  174  --   INR  --  1.39  --   --   CREATININE 3.46*  --  3.24* 3.62*  ALBUMIN 3.0*  --  3.1*  --   PROT 5.2*  --  5.5*  --   AST 176*  --  99*  --   ALT 526*  --  435*  --   ALKPHOS 70  --  67  --   BILITOT 2.0*  --  2.3*  --    Estimated Creatinine Clearance: 23.1 mL/min (A) (by C-G formula based on SCr of 3.62 mg/dL (H)).  Recent Labs    10/24/17 1230 10/24/17 1440 10/24/17 1604  GLUCAP 143* 133* 129*    Medications:  Scheduled:  . amiodarone  200 mg Oral Daily  . aspirin  81 mg Oral  Daily  . chlorhexidine gluconate (MEDLINE KIT)  15 mL Mouth Rinse BID  . digoxin  125 mcg Oral QODAY  . famotidine  20 mg Oral Daily  . haloperidol lactate  0.5 mg Intravenous QHS  . insulin aspart      . insulin aspart  0-15 Units Subcutaneous TID WC  . insulin aspart  5 Units Subcutaneous TID WC  . insulin glargine  9 Units Subcutaneous Daily  . metoprolol succinate  50 mg Oral Daily  . nitroGLYCERIN  0.3 mg Transdermal Daily   Infusions:  . milrinone 0.25 mcg/kg/min (10/24/17 1700)    Assessment: Pharmacy consulted for milrinone initiation for 67 yo male HF patient.   Plan:  Based on current renal function, will initiate milrinone 0.57mg/kg/min.   Pharmacy will continue to monitor and adjust per consult.   Lachlan Mckim L 10/24/2017,7:09 PM

## 2017-10-24 NOTE — Consult Note (Signed)
  Psychiatry: Brief follow-up note.  I have seen the patient the last couple days following him for the delirium that he had when he first came into the hospital.  Yesterday there was still some concern by the treatment team over how well he really understood the risk he was running going home.  Today by the time I saw him he had been transferred back to the intensive care unit.  Patient was awake but appeared to be a little confused.  Speech was somewhat slurred.  Not clear that he really understood his situation.  Answers to questions did not make sense consistently.  Patient seems to have a return of some delirium with his shortness of breath and downturn that brought him back to the emergency room.  In his current condition he does not appear to have capacity but that could change again once he gets his health back.  No change to any medicine for now.  I will follow-up as needed.

## 2017-10-24 NOTE — Progress Notes (Signed)
[facility], [place] 10/24/17  Subjective:  Patient more awake and alert this a.m. Renal function only marginally improved thus far. No signs of pulmonary edema at the moment.   Objective:  Vital signs in last 24 hours:  Temp:  [96.7 F (35.9 C)-98.4 F (36.9 C)] 96.7 F (35.9 C) (08/01 1455) Pulse Rate:  [59-70] 70 (08/01 1455) Resp:  [15-18] 15 (08/01 1455) BP: (105-123)/(63-97) 118/97 (08/01 1455) SpO2:  [95 %-100 %] 99 % (08/01 1455) Weight:  [95.7 kg (211 lb)] 95.7 kg (211 lb) (08/01 0500)  Weight change: 3.719 kg (8 lb 3.2 oz) Filed Weights   10/22/17 0749 10/23/17 0409 10/24/17 0500  Weight: 92 kg (202 lb 12.8 oz) 92.8 kg (204 lb 9.6 oz) 95.7 kg (211 lb)    Intake/Output:    Intake/Output Summary (Last 24 hours) at 10/24/2017 1459 Last data filed at 10/24/2017 1020 Gross per 24 hour  Intake 474.67 ml  Output 1700 ml  Net -1225.33 ml     Physical Exam: General:  No acute distress  HEENT  anicteric, moist oral mucous membranes  Neck:  Supple  Lungs:  Clear to auscultation bilaterally  Heart::  Irregular rhythm, no rub  Abdomen:  Soft, NTND BS present  Extremities:  1+ peripheral edema  Neurologic:  Awake, alert, oriented x 3 today  Skin:  skin in LE covered this AM        Basic Metabolic Panel:  Recent Labs  Lab 10/20/17 0027 10/21/17 0935 10/22/17 0431 10/23/17 0453 10/24/17 0513  NA 133* 132* 133* 140 137  K 3.9 3.8 4.1 4.6 4.4  CL 105 104 107 112* 109  CO2 16* 17* 18* 20* 21*  GLUCOSE 233* 229* 200* 164* 113*  BUN 91* 104* 110* 108* 127*  CREATININE 3.02* 3.30* 3.46* 3.24* 3.62*  CALCIUM 7.5* 7.9* 8.0* 8.4* 8.3*     CBC: Recent Labs  Lab 10/19/17 0627 10/20/17 0027 10/21/17 0935 10/22/17 0431 10/23/17 0453  WBC 5.5 7.4 7.8 7.5 10.5  NEUTROABS 4.6 6.5 6.9* 6.5 8.6*  HGB 10.8* 10.8* 10.2* 10.0* 11.0*  HCT 32.2* 31.8* 29.8* 28.7* 31.1*  MCV 86.5 84.4 84.7 84.3 84.6  PLT 54* 74* 120* 125* 174       Lab Results  Component Value Date   HEPBSAG Negative 10/17/2017   HEPBIGM Negative 10/17/2017      Microbiology:  Recent Results (from the past 240 hour(s))  Blood Culture (routine x 2)     Status: Abnormal   Collection Time: 10/15/17  8:10 PM  Result Value Ref Range Status   Specimen Description   Final    BLOOD LEFT ANTECUBITAL Performed at [facility], [address], [place], [place] [address]    Special Requests   Final    BOTTLES DRAWN AEROBIC AND ANAEROBIC Blood Culture adequate volume Performed at [facility], [address] [place], [place] [address]    Culture  Setup Time   Final    AEROBIC BOTTLE ONLY GRAM POSITIVE COCCI CRITICAL RESULT CALLED TO, READ BACK BY AND VERIFIED WITH: SHEEMA HALLAJI 10/16/17 1437 KLW    Culture (A)  Final    STAPHYLOCOCCUS SPECIES (COAGULASE NEGATIVE) THE SIGNIFICANCE OF ISOLATING THIS ORGANISM FROM A SINGLE SET OF BLOOD CULTURES WHEN MULTIPLE SETS ARE DRAWN IS UNCERTAIN. PLEASE NOTIFY THE MICROBIOLOGY DEPARTMENT WITHIN ONE WEEK IF SPECIATION AND SENSITIVITIES ARE REQUIRED. Performed at [facility], [place] [address], [place], [place] [address]    Report Status  10/18/2017 FINAL  Final  Blood Culture (routine x 2)     Status: None   Collection Time: 10/15/17  8:10 PM  Result Value Ref Range Status   Specimen Description BLOOD BLOOD RIGHT WRIST  Final   Special Requests   Final    BOTTLES DRAWN AEROBIC AND ANAEROBIC Blood Culture adequate volume   Culture   Final    NO GROWTH 5 DAYS Performed at [facility], [address] [place], [place] [address]    Report Status 10/20/2017 FINAL  Final  Blood Culture ID Panel (Reflexed)     Status: Abnormal   Collection Time: 10/15/17  8:10 PM  Result Value Ref Range Status   Enterococcus species NOT DETECTED NOT DETECTED Final   Listeria monocytogenes NOT DETECTED NOT DETECTED Final   Staphylococcus species DETECTED (A) NOT DETECTED Final    Comment:  Methicillin (oxacillin) susceptible coagulase negative staphylococcus. Possible blood culture contaminant (unless isolated from more than one blood culture draw or clinical case suggests pathogenicity). No antibiotic treatment is indicated for blood  culture contaminants. CRITICAL RESULT CALLED TO, READ BACK BY AND VERIFIED WITH: SHEEMA HALLAJI 10/16/17 1437 KLW    Staphylococcus aureus NOT DETECTED NOT DETECTED Final   Methicillin resistance NOT DETECTED NOT DETECTED Final   Streptococcus species NOT DETECTED NOT DETECTED Final   Streptococcus agalactiae NOT DETECTED NOT DETECTED Final   Streptococcus pneumoniae NOT DETECTED NOT DETECTED Final   Streptococcus pyogenes NOT DETECTED NOT DETECTED Final   Acinetobacter baumannii NOT DETECTED NOT DETECTED Final   Enterobacteriaceae species NOT DETECTED NOT DETECTED Final   Enterobacter cloacae complex NOT DETECTED NOT DETECTED Final   Escherichia coli NOT DETECTED NOT DETECTED Final   Klebsiella oxytoca NOT DETECTED NOT DETECTED Final   Klebsiella pneumoniae NOT DETECTED NOT DETECTED Final   Proteus species NOT DETECTED NOT DETECTED Final   Serratia marcescens NOT DETECTED NOT DETECTED Final   Haemophilus influenzae NOT DETECTED NOT DETECTED Final   Neisseria meningitidis NOT DETECTED NOT DETECTED Final   Pseudomonas aeruginosa NOT DETECTED NOT DETECTED Final   Candida albicans NOT DETECTED NOT DETECTED Final   Candida glabrata NOT DETECTED NOT DETECTED Final   Candida krusei NOT DETECTED NOT DETECTED Final   Candida parapsilosis NOT DETECTED NOT DETECTED Final   Candida tropicalis NOT DETECTED NOT DETECTED Final    Comment: Performed at [facility], [address], [place], [place] [address]  Urine culture     Status: Abnormal   Collection Time: 10/16/17  4:11 AM  Result Value Ref Range Status   Specimen Description   Final    URINE, RANDOM Performed at [facility], [address] [place], [place] [address]     Special Requests   Final    NONE Performed at [facility], [address] [place], [place] [address]    Culture >=100,000 COLONIES/mL ESCHERICHIA COLI (A)  Final   Report Status 10/18/2017 FINAL  Final   Organism ID, Bacteria ESCHERICHIA COLI (A)  Final      Susceptibility   Escherichia coli - MIC*    AMPICILLIN 8 SENSITIVE Sensitive     CEFAZOLIN <=4 SENSITIVE Sensitive     CEFTRIAXONE <=1 SENSITIVE Sensitive     CIPROFLOXACIN <=0.25 SENSITIVE Sensitive     GENTAMICIN <=1 SENSITIVE Sensitive     IMIPENEM <=0.25 SENSITIVE Sensitive     NITROFURANTOIN <=16 SENSITIVE Sensitive     TRIMETH/SULFA <=20 SENSITIVE Sensitive     AMPICILLIN/SULBACTAM 4 SENSITIVE Sensitive  PIP/TAZO <=4 SENSITIVE Sensitive     Extended ESBL NEGATIVE Sensitive     * >=100,000 COLONIES/mL ESCHERICHIA COLI  CULTURE, BLOOD (ROUTINE X 2) w Reflex to ID Panel     Status: None   Collection Time: 10/16/17  4:12 PM  Result Value Ref Range Status   Specimen Description BLOOD BLOOD LEFT HAND  Final   Special Requests   Final    BOTTLES DRAWN AEROBIC AND ANAEROBIC Blood Culture results may not be optimal due to an excessive volume of blood received in culture bottles   Culture   Final    NO GROWTH 5 DAYS Performed at [facility], [address] [place], [place] [address]    Report Status 10/21/2017 FINAL  Final  CULTURE, BLOOD (ROUTINE X 2) w Reflex to ID Panel     Status: None   Collection Time: 10/16/17  4:43 PM  Result Value Ref Range Status   Specimen Description BLOOD LEFT HAND  Final   Special Requests   Final    BOTTLES DRAWN AEROBIC AND ANAEROBIC Blood Culture results may not be optimal due to an inadequate volume of blood received in culture bottles   Culture   Final    NO GROWTH 5 DAYS Performed at [facility], [address] [place], [place] [address]    Report Status 10/21/2017 FINAL  Final  MRSA PCR Screening     Status: None   Collection Time: 10/16/17  5:01  PM  Result Value Ref Range Status   MRSA by PCR NEGATIVE NEGATIVE Final    Comment:        The GeneXpert MRSA Assay (FDA approved for NASAL specimens only), is one component of a comprehensive MRSA colonization surveillance program. It is not intended to diagnose MRSA infection nor to guide or monitor treatment for MRSA infections. Performed at [facility], [address] [place], [place] [address]     Coagulation Studies: Recent Labs    10/22/17 1654  LABPROT 16.9*  INR 1.39    Urinalysis: No results for input(s): COLORURINE, LABSPEC, PHURINE, GLUCOSEU, HGBUR, BILIRUBINUR, KETONESUR, PROTEINUR, UROBILINOGEN, NITRITE, LEUKOCYTESUR in the last 72 hours.  Invalid input(s): APPERANCEUR    Imaging: No results found.   Medications:    . amiodarone  200 mg Oral Daily  . aspirin  81 mg Oral Daily  . chlorhexidine gluconate (MEDLINE KIT)  15 mL Mouth Rinse BID  . digoxin  125 mcg Oral QODAY  . famotidine  20 mg Oral Daily  . haloperidol lactate  0.5 mg Intravenous QHS  . insulin aspart  0-15 Units Subcutaneous TID WC  . insulin aspart  5 Units Subcutaneous TID WC  . insulin glargine  9 Units Subcutaneous Daily  . metoprolol succinate  50 mg Oral Daily  . nitroGLYCERIN  0.3 mg Transdermal Daily   acetaminophen, albuterol, bisacodyl, docusate, ipratropium-albuterol, metoprolol tartrate, nitroGLYCERIN, [DISCONTINUED] ondansetron **OR** ondansetron (ZOFRAN) IV, senna-docusate, traZODone  Assessment/ Plan:  67 y.o. Caucasian male with medical problems of severe cardiomyopathy with LVEF less than 10% followed at [facility], atrial fibrillation, AICD diabetes, hypertension, history of stroke, CKD, who was admitted to [facility] on 10/15/2017 for evaluation of maculopapular rash over lower extremities and AICD firing.  Hospital course complicated by wide-complex tachycardia, severe respiratory distress, transferred to ICU, ventilator support.  Patient self  extubated.  1.  Acute renal failure, chronic kidney disease stage III Likely ATN from cardiorenal syndrome, hypoperfusion Baseline creatinine 1.32 at admission/GFR 55 -  patient continues to have good urine output at 1.7 L over the preceding 24 hours however his renal function continues to decline.  BUN currently up to 127 with a creatinine of 3.62.  Over the past several days diuretics have been held and the patient has actually been hydrated.  Suspect that cardiorenal syndrome is playing a large role in his worsening renal function.  As such we have advised consideration of milrinone infusion.  If renal function continues to deteriorate we may need to consider renal replacement therapy.  2.  Urinary tract infection from E. coli Patient was treated with Cefdinir.    3.  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 Continue to monitor his heart function closely.      LOS: 8 Avrie Kedzierski 8/1/20192:59 PM  [place], [place]  Note: This note was prepared with Dragon dictation. Any transcription errors are unintentional

## 2017-10-24 NOTE — Evaluation (Signed)
Occupational Therapy Evaluation Patient Details Name: Derek Barnes MRN: [id number] DOB: [birth year] Today's Date: 10/24/2017    History of Present Illness Pt is 67 y/o male who comes to [facility] on 7/23 with BLE rash, and associated dyspnea, respiratory distress, and tachycardia. Pt required intubation, (self extubated) , and also noted to have a UTI c e Coli. 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 PMH: severe cardiomyopathy with 10% EF, AF, AICD, DM, HTN, CVA, CKD.    Clinical Impression   Pt seen for OT evaluation this date. 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  Pt lives alone in two story house.  Currently pt demonstrates slight impairments in sitting versus standing balance requiring SUP-CGA assist for seated LB dressing (dyanmic seated reaching) and mobility.  Pt would benefit from skilled OT to address noted impairments and functional limitations (see below for any additional details) in order to maximize safety and independence while minimizing falls risk and caregiver burden.  Upon hospital discharge, recommend pt discharge to [facility] to ensure smooth transition to home environment.    Follow Up Recommendations  Home health OT    Equipment Recommendations  None recommended by OT    Recommendations for Other Services       Precautions / Restrictions Precautions Precautions: Fall Restrictions Weight Bearing Restrictions: No      Mobility Bed Mobility Overal bed mobility: Modified Independent             General bed mobility comments: HOB 20 degrees  Transfers Overall transfer level: Needs assistance Equipment used: None Transfers: Sit to/from Stand Sit to Stand: Min guard         General transfer comment: initially unsteady, but able to self correct for static standing and side stepping.     Balance Overall balance  assessment: Needs assistance Sitting-balance support: No upper extremity supported;Feet supported Sitting balance-Leahy Scale: Normal     Standing balance support: During functional activity Standing balance-Leahy Scale: Good Standing balance comment: slight unsteadiness with side steps, no LOB                           ADL either performed or assessed with clinical judgement   ADL Overall ADL's : Needs assistance/impaired             Lower Body Bathing: Minimal assistance       Lower Body Dressing: Minimal assistance               Functional mobility during ADLs: Min guard General ADL Comments: Pt performs sit to stand from EOB with SBA, takes side steps at EOB x6 towards HOB.      Vision Baseline Vision/History: No visual deficits       Perception     Praxis      Pertinent Vitals/Pain Pain Assessment: No/denies pain     Hand Dominance Right   Extremity/Trunk Assessment Upper Extremity Assessment Upper Extremity Assessment: Overall WFL for tasks assessed   Lower Extremity Assessment Lower Extremity Assessment: Defer to PT evaluation   Cervical / Trunk Assessment Cervical / Trunk Assessment: Normal   Communication     Cognition Arousal/Alertness: Awake/alert Behavior During Therapy: WFL for tasks assessed/performed Overall Cognitive Status: Difficult to assess  General Comments: Pt clear at times, but demonstrates short period of garbled speech or difficulty with word finding, on orientation questions, initially guessed the month was october and required minimal promting to arrive at the correct answer.    General Comments       Exercises Other Exercises Other Exercises: Pt educated about fall prevention strategies, use of call light, pt verbalized understanding reluctantly.  Other Exercises: Pt educated about fall hazards and surveying environment for hazards for standing ADLs and  functional mobility.    Shoulder Instructions      Home Living Family/patient expects to be discharged to:: Private residence Living Arrangements: Alone Available Help at Discharge: Family;Friend(s);Personal care attendant(Pt unclear in describing hours/days PCA will be available to him, states he has set it up for when he leaves. 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        OT Problem List: Decreased strength;Decreased range of motion;Decreased activity tolerance;Impaired balance (sitting and/or standing);Decreased safety awareness      OT Treatment/Interventions: Self-care/ADL training;Therapeutic exercise;Energy conservation;DME and/or AE instruction;Therapeutic activities;Balance training;Patient/family education    OT Goals(Current goals can be found in the care plan section) Acute Rehab OT Goals Patient Stated Goal: get back to all the stuff I was doing OT Goal Formulation: With patient Time For Goal Achievement: 11/07/17 Potential to Achieve Goals: Good ADL Goals Pt Will Perform Lower Body Dressing: with set-up(with AE PRN seated vs standing for clothing mgt over hips) Additional ADL Goal #1: pt will identify 3/4 envioronmental hazards w/in room during functional mobility or standing ADLs to reduce  fall risk for home environment with no verbal cues.  OT Frequency: Min 1X/week   Barriers to D/C:            Co-evaluation              AM-PAC PT "6 Clicks" Daily Activity     Outcome Measure Help from another person eating meals?: None Help from another person taking care of personal grooming?: None Help from another person toileting, which includes using toliet, bedpan, or urinal?: A Little Help from another person bathing (including washing, rinsing, drying)?: A Little Help from another person to put on and taking off regular upper body clothing?: None Help from another person to put on and taking off regular lower body clothing?: A Little 6 Click Score: 21   End of Session Equipment Utilized During Treatment: Gait belt Nurse Communication: (pt medication concerns)  Activity Tolerance: Patient tolerated treatment well Patient left: in bed;with call bell/phone within reach;with bed alarm set  OT Visit Diagnosis: Unsteadiness on feet (R26.81);Other abnormalities of gait and mobility (R26.89);Muscle weakness (generalized) (M62.81)                Time: 1164-3539 OT Time Calculation (min): 26 min Charges:  OT General Charges $OT Visit: 1 Visit OT Evaluation $OT Eval Moderate Complexity: 1 Mod OT Treatments $Self Care/Home Management : 8-22 mins  Rejeana Brock, MS, OTR/L ascom [phone] or [phone] 10/24/17, 1:19 PM

## 2017-10-24 NOTE — Clinical Social Work Note (Signed)
Clinical Social Work Assessment  Patient Details  Name: Derek Barnes MRN: [id number] Date of Birth: 1950/07/30  Date of referral:  10/22/17               Reason for consult:  Facility Placement                Permission sought to share information with:  Facility Medical sales representative, Family Supports Permission granted to share information::  Yes, Verbal Permission Granted  Name::     Derek Barnes   [phone] or Derek Barnes [phone]  or [address], [place] Other   [phone]   Agency::  SNF admissions  Relationship::     Contact Information:     Housing/Transportation Living arrangements for the past 2 months:  Single Family Home Source of Information:  Patient Patient Interpreter Needed:  None Criminal Activity/Legal Involvement Pertinent to Current Situation/Hospitalization:  No - Comment as needed Significant Relationships:  Friend Lives with:  Self Do you feel safe going back to the place where you live?  Yes Need for family participation in patient care:  No (Coment)  Care giving concerns:  Patient does not feel like he needs to go to SNF for short term rehab.   Social Worker assessment / plan:  Patient is a 67 year old male who is alert and oriented x3.  Patient lives alone, and says he has hired someone to help him out with things.  Patient is not interested in going to SNF for short term rehab.  Patient was explained the benefits of going to SNF verse going home with home health, and patient stated he still would rather go home with home health.  CSW discussed with patient, and he does not want CSW to look for SNF placement for patient.  CSW to update case Production designer, theatre/television/film.  Employment status:  Retired Health and safety inspector:  Managed Care PT Recommendations:  Skilled Nursing Facility Information / Referral to community resources:  Skilled Nursing Facility  Patient/Family's Response to care:  Patient does not want to go to SNF, he would rather go home  with home health.  Patient/Family's Understanding of and Emotional Response to Diagnosis, Current Treatment, and Prognosis:  Patient expressed that he is hoping he can go home soon with home health.  Emotional Assessment Appearance:  Appears stated age Attitude/Demeanor/Rapport:    Affect (typically observed):  Appropriate, Stable Orientation:  Oriented to Self, Oriented to Place, Oriented to Situation Alcohol / Substance use:  Not Applicable Psych involvement (Current and /or in the community):  Yes (Comment)  Discharge Needs  Concerns to be addressed:  Cognitive Concerns, Care Coordination, Lack of Support Readmission within the last 30 days:  No Current discharge risk:  Lack of support system, Lives alone Barriers to Discharge:  Continued Medical Work up   Derek Barnes 10/24/2017, 4:18 PM

## 2017-10-25 MED ORDER — SEVELAMER CARBONATE 800 MG PO TABS
800.00 | ORAL_TABLET | ORAL | Status: DC
Start: 2017-10-25 — End: 2017-10-25

## 2017-10-25 MED ORDER — ASPIRIN 81 MG PO CHEW
81.00 | CHEWABLE_TABLET | ORAL | Status: DC
Start: 2017-10-26 — End: 2017-10-25

## 2017-10-25 MED ORDER — INSULIN LISPRO 100 UNIT/ML ~~LOC~~ SOLN
.00 | SUBCUTANEOUS | Status: DC
Start: 2017-10-25 — End: 2017-10-25

## 2017-10-25 MED ORDER — DEXTROSE 10 % IV SOLN
25.00 | INTRAVENOUS | Status: DC
Start: ? — End: 2017-10-25

## 2017-10-25 MED ORDER — DIGOXIN 125 MCG PO TABS
125.00 | ORAL_TABLET | ORAL | Status: DC
Start: 2017-10-25 — End: 2017-10-25

## 2017-10-25 MED ORDER — HEPARIN SODIUM (PORCINE) 5000 UNIT/ML IJ SOLN
5000.00 | INTRAMUSCULAR | Status: DC
Start: 2017-10-25 — End: 2017-10-25

## 2017-10-25 MED ORDER — TAMSULOSIN HCL 0.4 MG PO CAPS
.40 | ORAL_CAPSULE | ORAL | Status: DC
Start: 2017-10-26 — End: 2017-10-25

## 2017-10-25 MED ORDER — HYDRALAZINE HCL 10 MG PO TABS
10.00 | ORAL_TABLET | ORAL | Status: DC
Start: 2017-10-25 — End: 2017-10-25

## 2017-10-25 MED ORDER — ACETAMINOPHEN 325 MG PO TABS
650.00 | ORAL_TABLET | ORAL | Status: DC
Start: ? — End: 2017-10-25

## 2017-10-25 MED ORDER — CEFDINIR 300 MG PO CAPS
300.00 | ORAL_CAPSULE | ORAL | Status: DC
Start: 2017-10-26 — End: 2017-10-25

## 2017-10-25 MED ORDER — AMIODARONE HCL 200 MG PO TABS
200.00 | ORAL_TABLET | ORAL | Status: DC
Start: 2017-10-26 — End: 2017-10-25

## 2017-10-25 MED ORDER — ATORVASTATIN CALCIUM 80 MG PO TABS
80.00 | ORAL_TABLET | ORAL | Status: DC
Start: 2017-10-25 — End: 2017-10-25

## 2017-10-25 NOTE — Progress Notes (Signed)
Report given to [facility]. Report given to Lurena Joiner at [facility] who will be taking over as his nurse. All questions answered. Call back number given. [facility] transportation leaving facility at this time.

## 2018-02-23 DEATH — deceased

## 2019-11-19 IMAGING — CT CT HEAD W/O CM
3 of 6 series · 16 of 47 positions shown, 19 images · non-contrast
Comparison: None.

CLINICAL DATA: Memory loss

EXAM:
CT HEAD WITHOUT CONTRAST
TECHNIQUE: Contiguous axial images were obtained from the base of the skull
through the vertex without intravenous contrast.

[Series 6: head wo thin · axial · 0.47mm/px · z∈[+450,+578]mm · 10 of 105 slices shown, 13 images]
[im 10/105  brain]
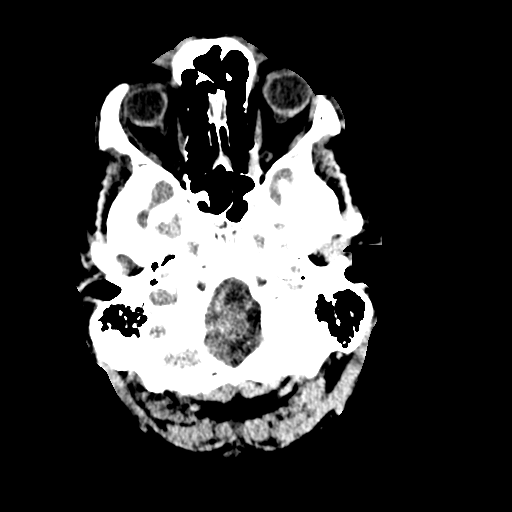
[im 10/105  bone]
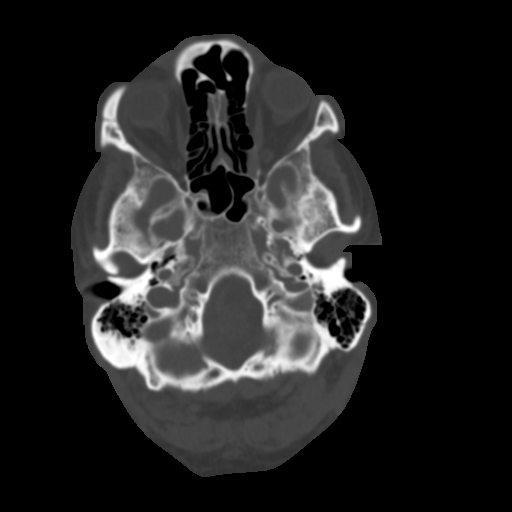
[im 19/105  brain]
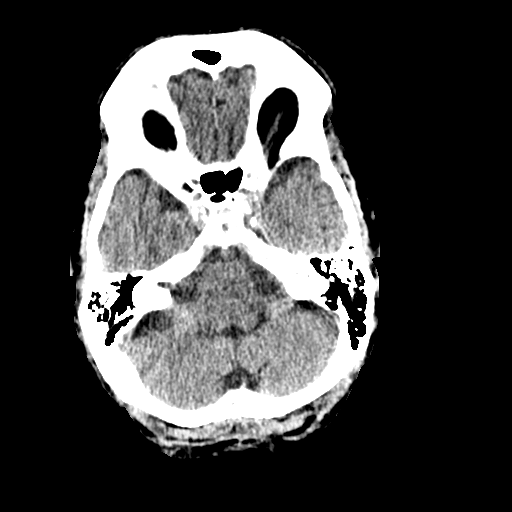
[im 29/105  brain]
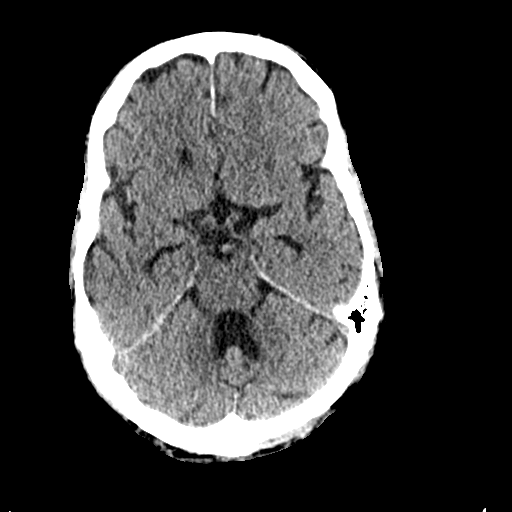
[im 38/105  brain]
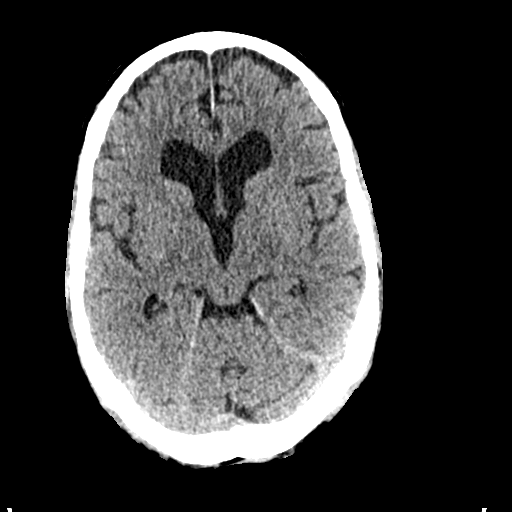
[im 48/105  brain]
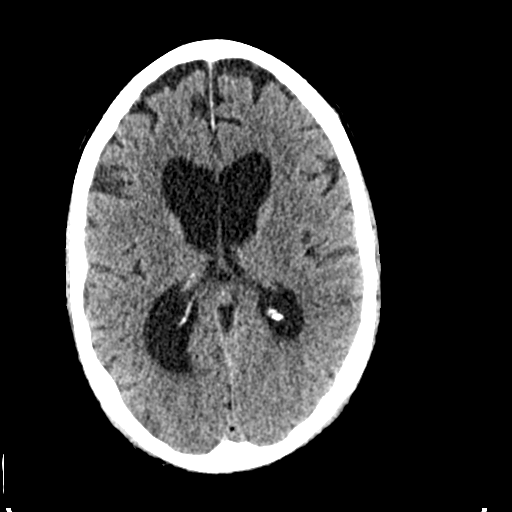
[im 48/105  bone]
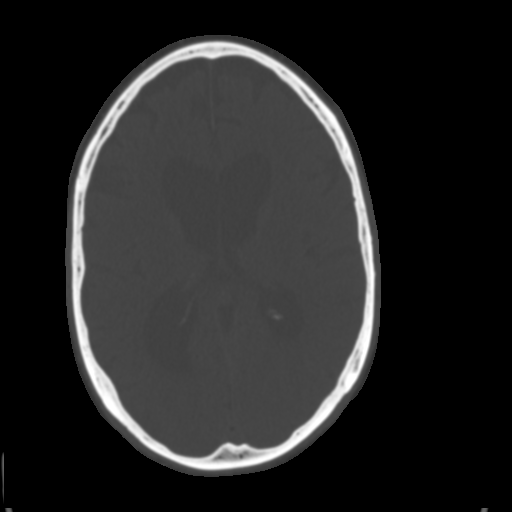
[im 57/105  brain]
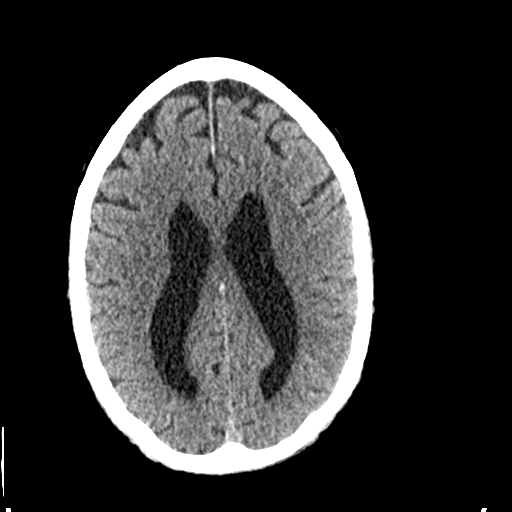
[im 67/105  brain]
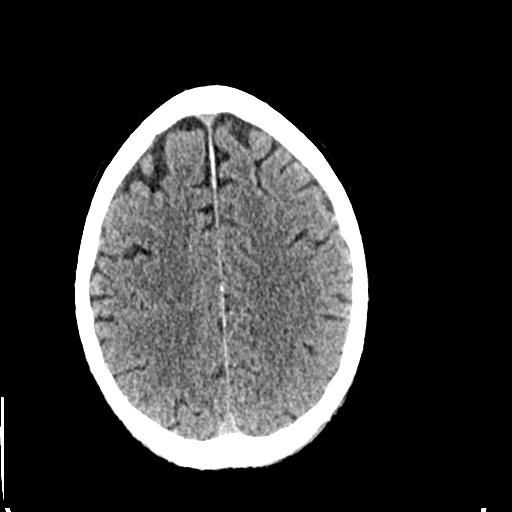
[im 76/105  brain]
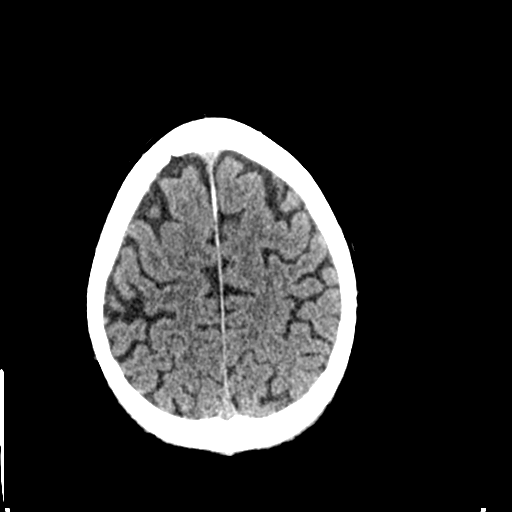
[im 86/105  brain]
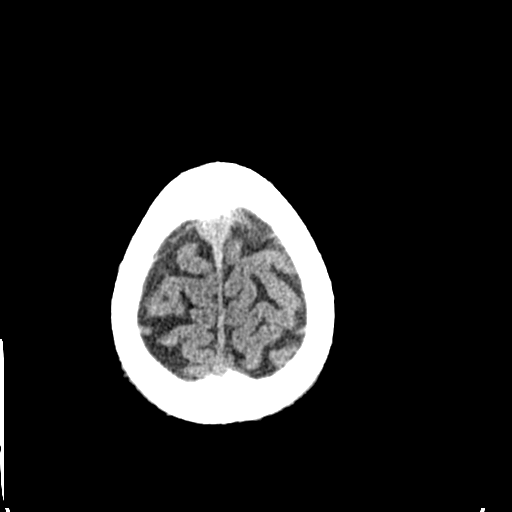
[im 86/105  bone]
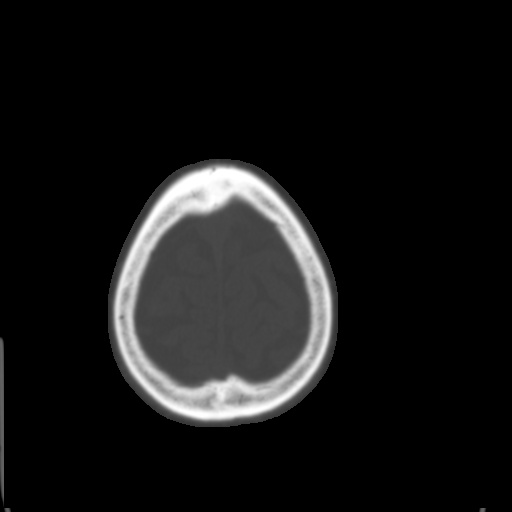
[im 95/105  brain]
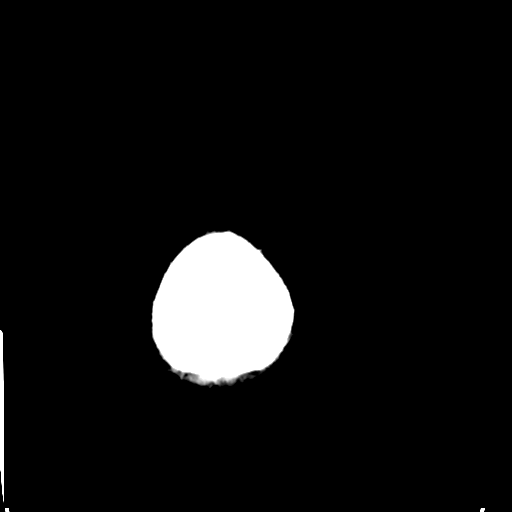

[Series 7: cor head wo thin · coronal · 0.33mm/px · 3 of 132 slices shown]
[im 27/132  brain]
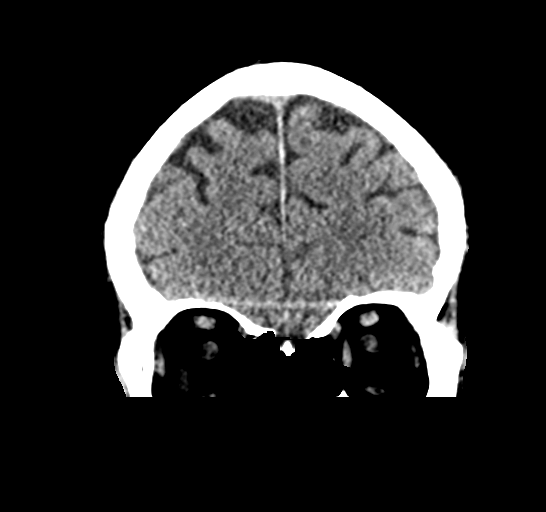
[im 53/132  brain]
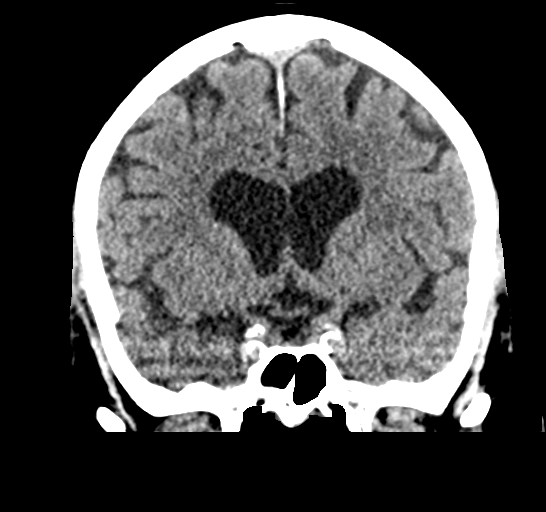
[im 79/132  brain]
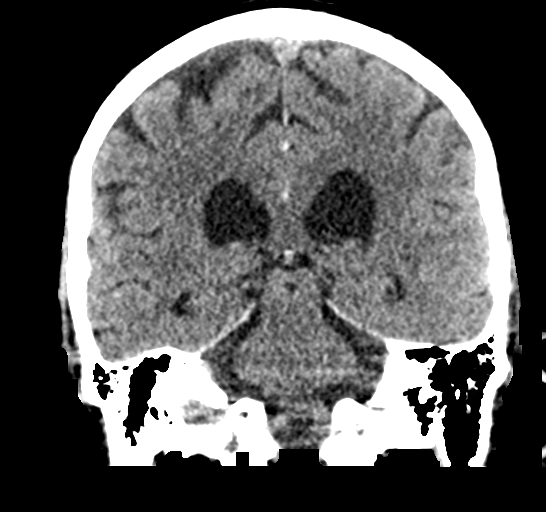

[Series 8: sag head wo thin · sagittal · 0.35mm/px · 3 of 93 slices shown]
[im 19/93  brain]
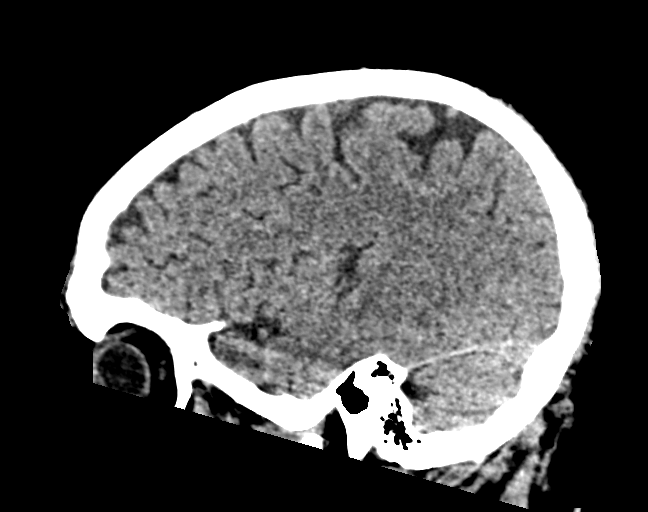
[im 37/93  brain]
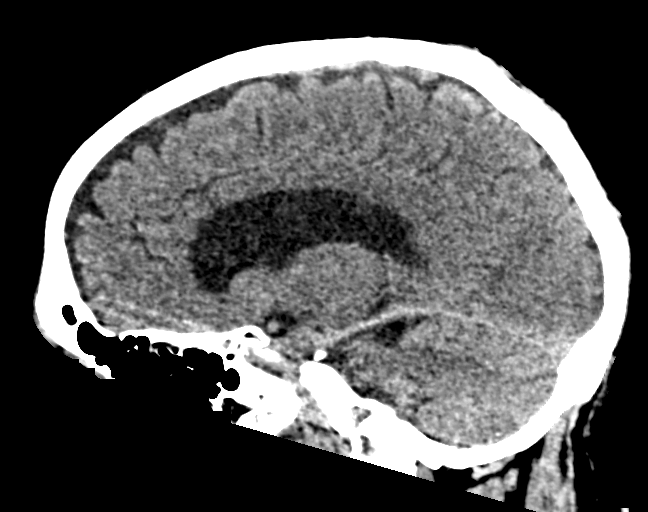
[im 56/93  brain]
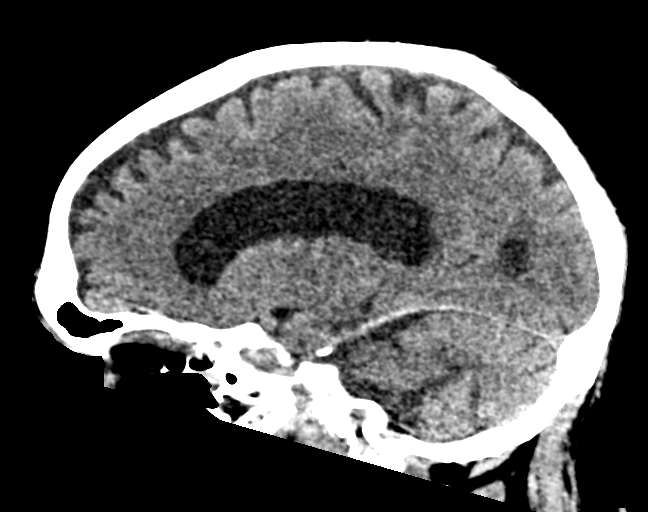

[16 of 47 positions shown; findings below may reference images not displayed]

FINDINGS: Brain: No evidence of acute infarction, hemorrhage, hydrocephalus,
extra-axial collection or mass lesion/mass effect.

Vascular: No hyperdense vessel or unexpected calcification.

Skull: Normal. Negative for fracture or focal lesion.

Sinuses/Orbits: No acute finding.

Other: None.
IMPRESSION: Normal head CT

## 2020-06-06 IMAGING — DX DG CHEST 1V PORT
1 series · 2 of 2 positions shown · non-contrast
Comparison: 10/17/2017

CLINICAL DATA: Respiratory failure.

EXAM:
PORTABLE CHEST 1 VIEW

[Series 1: chest ap · 0.14mm/px · 2 of 2 slices shown]
[im 1/2]
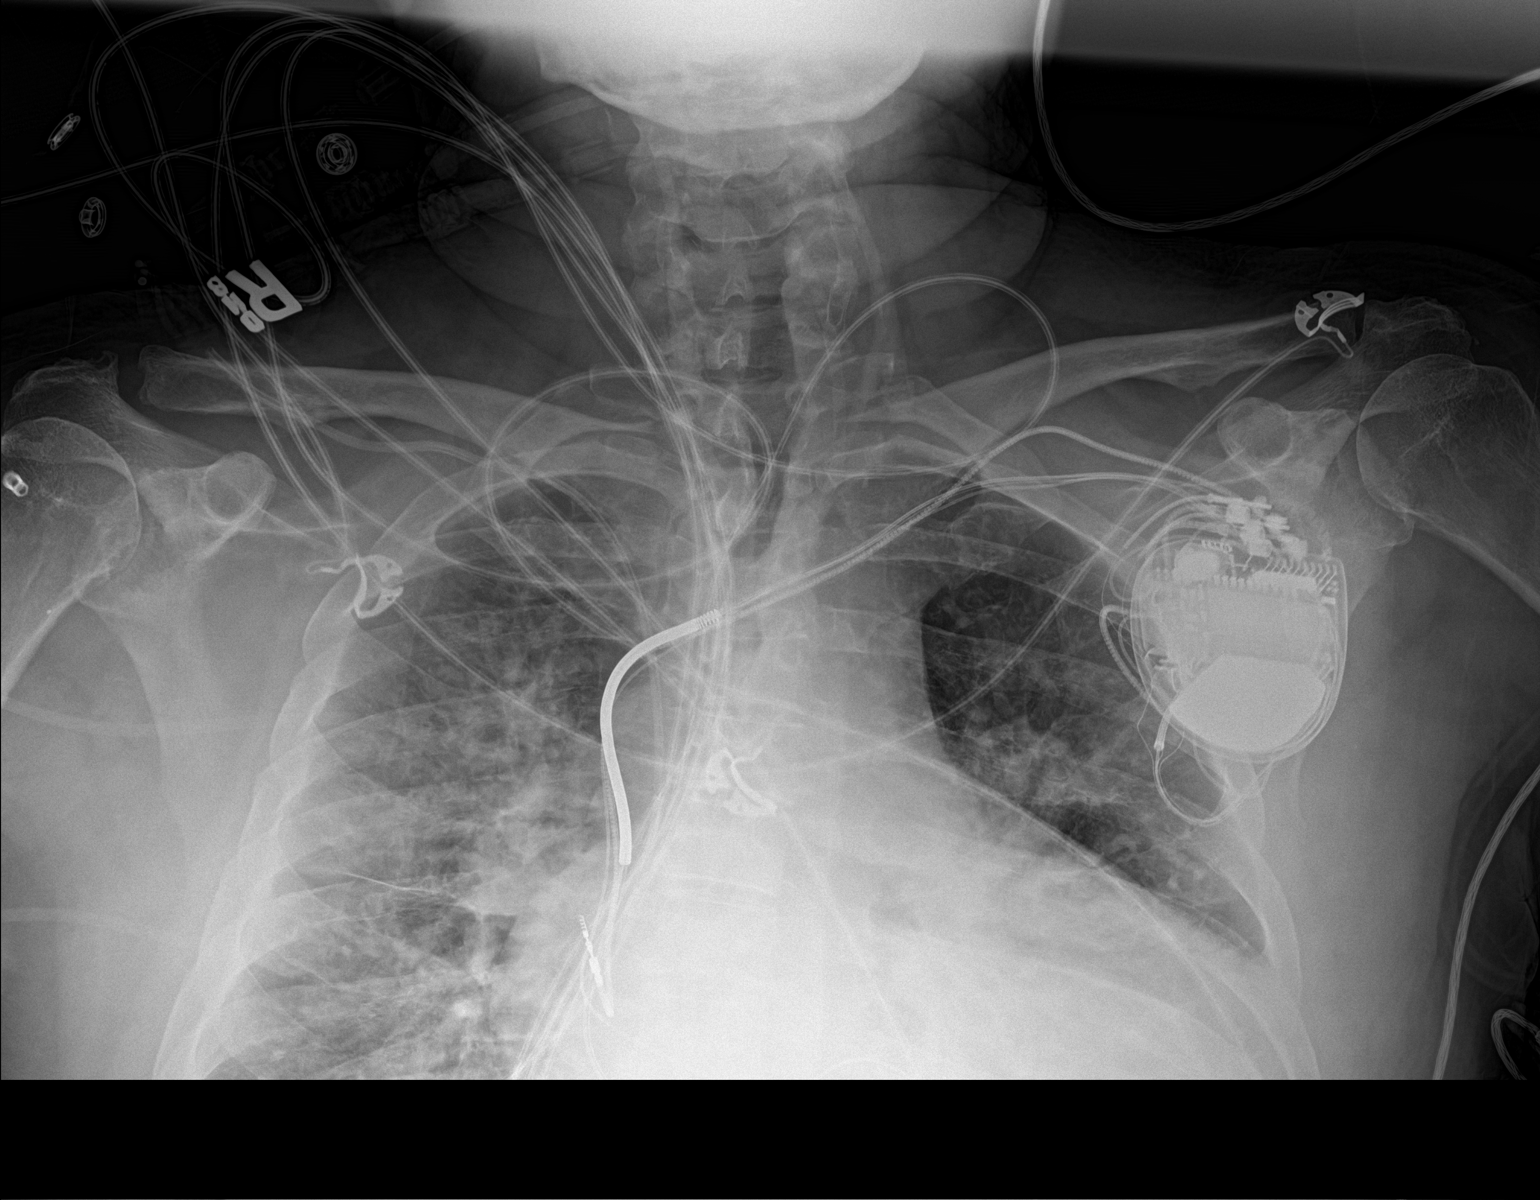
[im 2/2]
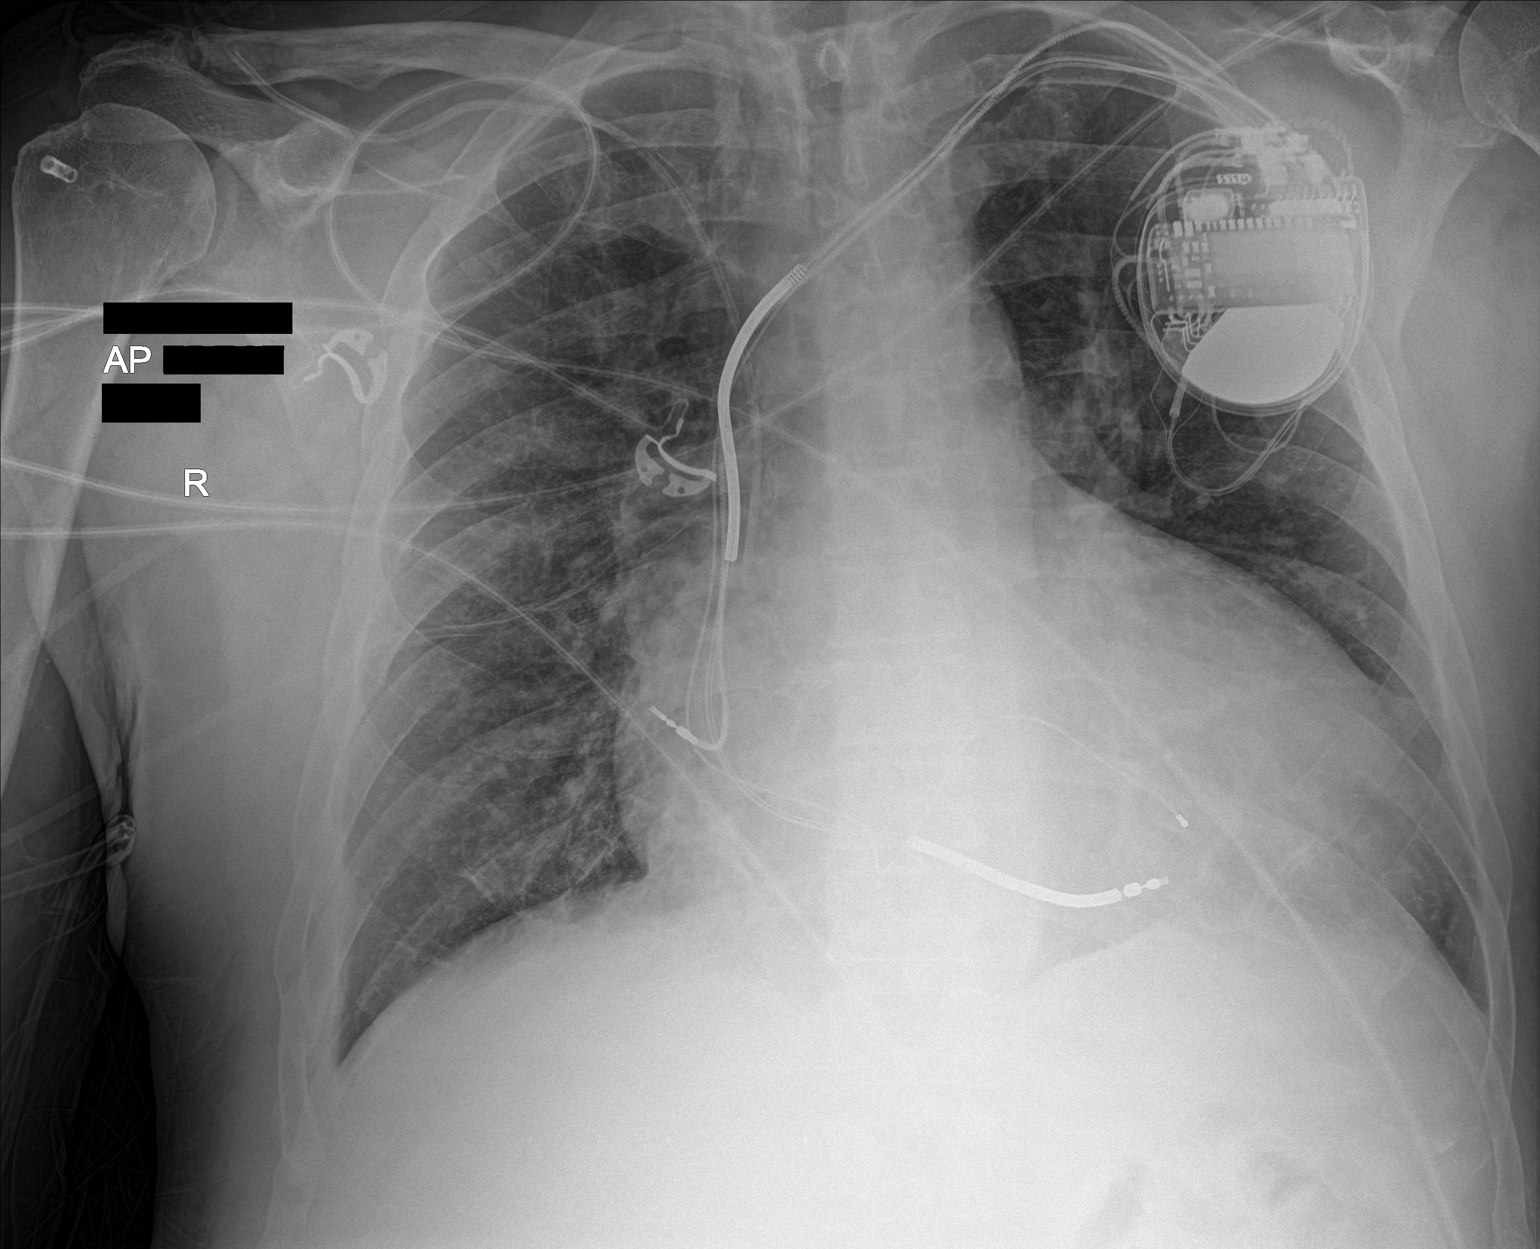

[2 of 2 positions shown; findings below may reference images not displayed]

FINDINGS: Cardiac pacemaker. Interval removal of endotracheal and enteric
tubes. Right central venous catheter tip overlies the mid SVC
region. Shallow inspiration. Cardiac enlargement. Since the previous
study, there is increasing perihilar infiltration possibly
representing edema or pneumonia. No pneumothorax.
IMPRESSION: Cardiac enlargement with increasing perihilar infiltration.
